# Patient Record
Sex: Female | Born: 1945 | Race: White | Hispanic: No | Marital: Married | Smoking: Never smoker
Health system: Southern US, Community
[De-identification: ages and names within clinical notes are randomized; demographics above are authoritative.]

## PROBLEM LIST (undated history)

## (undated) DIAGNOSIS — H43819 Vitreous degeneration, unspecified eye: Secondary | ICD-10-CM

## (undated) DIAGNOSIS — M160 Bilateral primary osteoarthritis of hip: Secondary | ICD-10-CM

## (undated) DIAGNOSIS — K5730 Diverticulosis of large intestine without perforation or abscess without bleeding: Secondary | ICD-10-CM

## (undated) DIAGNOSIS — I4891 Unspecified atrial fibrillation: Secondary | ICD-10-CM

## (undated) DIAGNOSIS — N12 Tubulo-interstitial nephritis, not specified as acute or chronic: Secondary | ICD-10-CM

## (undated) DIAGNOSIS — K219 Gastro-esophageal reflux disease without esophagitis: Secondary | ICD-10-CM

## (undated) DIAGNOSIS — R93429 Abnormal radiologic findings on diagnostic imaging of unspecified kidney: Secondary | ICD-10-CM

## (undated) DIAGNOSIS — K279 Peptic ulcer, site unspecified, unspecified as acute or chronic, without hemorrhage or perforation: Secondary | ICD-10-CM

## (undated) DIAGNOSIS — E119 Type 2 diabetes mellitus without complications: Secondary | ICD-10-CM

## (undated) DIAGNOSIS — I10 Essential (primary) hypertension: Secondary | ICD-10-CM

## (undated) HISTORY — DX: Tubulo-interstitial nephritis, not specified as acute or chronic: N12

## (undated) HISTORY — DX: Bilateral primary osteoarthritis of hip: M16.0

## (undated) HISTORY — DX: Peptic ulcer, site unspecified, unspecified as acute or chronic, without hemorrhage or perforation: K27.9

## (undated) HISTORY — DX: Abnormal radiologic findings on diagnostic imaging of unspecified kidney: R93.429

## (undated) HISTORY — DX: Vitreous degeneration, unspecified eye: H43.819

## (undated) HISTORY — DX: Gastro-esophageal reflux disease without esophagitis: K21.9

## (undated) HISTORY — DX: Diverticulosis of large intestine without perforation or abscess without bleeding: K57.30

---
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
## 1972-03-14 HISTORY — PX: TUBAL LIGATION: SHX77

## 1980-03-14 DIAGNOSIS — K279 Peptic ulcer, site unspecified, unspecified as acute or chronic, without hemorrhage or perforation: Secondary | ICD-10-CM

## 1980-03-14 HISTORY — DX: Peptic ulcer, site unspecified, unspecified as acute or chronic, without hemorrhage or perforation: K27.9

## 1989-01-12 HISTORY — PX: CHOLECYSTECTOMY: SHX55

## 1989-01-12 HISTORY — PX: TOTAL ABDOMINAL HYSTERECTOMY: SHX209

## 1993-03-14 HISTORY — PX: BREAST REDUCTION SURGERY: SHX8

## 1994-03-14 HISTORY — PX: EXCISION MORTON'S NEUROMA: SHX5013

## 1997-09-11 HISTORY — PX: SALPINGOOPHORECTOMY: SHX82

## 1998-10-09 ENCOUNTER — Encounter: Payer: Self-pay | Admitting: General Surgery

## 1998-10-12 ENCOUNTER — Encounter (INDEPENDENT_AMBULATORY_CARE_PROVIDER_SITE_OTHER): Payer: Self-pay | Admitting: Specialist

## 1998-10-12 ENCOUNTER — Inpatient Hospital Stay (HOSPITAL_COMMUNITY): Admission: RE | Admit: 1998-10-12 | Discharge: 1998-10-15 | Payer: Self-pay | Admitting: *Deleted

## 1998-10-12 ENCOUNTER — Encounter: Payer: Self-pay | Admitting: General Surgery

## 1999-07-13 ENCOUNTER — Encounter: Admission: RE | Admit: 1999-07-13 | Discharge: 1999-07-13 | Payer: Self-pay | Admitting: Family Medicine

## 1999-07-13 ENCOUNTER — Encounter: Payer: Self-pay | Admitting: Family Medicine

## 1999-07-27 ENCOUNTER — Ambulatory Visit (HOSPITAL_COMMUNITY): Admission: RE | Admit: 1999-07-27 | Discharge: 1999-07-27 | Payer: Self-pay | Admitting: Neurosurgery

## 1999-07-27 ENCOUNTER — Encounter: Payer: Self-pay | Admitting: Neurosurgery

## 2000-02-04 ENCOUNTER — Other Ambulatory Visit: Admission: RE | Admit: 2000-02-04 | Discharge: 2000-02-04 | Payer: Self-pay | Admitting: Obstetrics and Gynecology

## 2000-03-28 ENCOUNTER — Encounter: Admission: RE | Admit: 2000-03-28 | Discharge: 2000-03-28 | Payer: Self-pay | Admitting: Family Medicine

## 2000-04-25 ENCOUNTER — Encounter: Admission: RE | Admit: 2000-04-25 | Discharge: 2000-04-25 | Payer: Self-pay | Admitting: Family Medicine

## 2000-04-27 ENCOUNTER — Ambulatory Visit (HOSPITAL_COMMUNITY): Admission: RE | Admit: 2000-04-27 | Discharge: 2000-04-27 | Payer: Self-pay | Admitting: Sports Medicine

## 2000-04-27 ENCOUNTER — Encounter: Payer: Self-pay | Admitting: Sports Medicine

## 2000-05-02 ENCOUNTER — Encounter: Admission: RE | Admit: 2000-05-02 | Discharge: 2000-05-02 | Payer: Self-pay | Admitting: Family Medicine

## 2000-05-26 ENCOUNTER — Encounter: Admission: RE | Admit: 2000-05-26 | Discharge: 2000-05-26 | Payer: Self-pay | Admitting: Family Medicine

## 2000-05-31 ENCOUNTER — Encounter: Payer: Self-pay | Admitting: Obstetrics and Gynecology

## 2000-05-31 ENCOUNTER — Encounter: Admission: RE | Admit: 2000-05-31 | Discharge: 2000-05-31 | Payer: Self-pay | Admitting: Obstetrics and Gynecology

## 2000-06-13 ENCOUNTER — Encounter: Admission: RE | Admit: 2000-06-13 | Discharge: 2000-06-13 | Payer: Self-pay | Admitting: Family Medicine

## 2000-08-09 ENCOUNTER — Encounter: Admission: RE | Admit: 2000-08-09 | Discharge: 2000-08-09 | Payer: Self-pay | Admitting: Family Medicine

## 2001-01-12 DIAGNOSIS — N12 Tubulo-interstitial nephritis, not specified as acute or chronic: Secondary | ICD-10-CM

## 2001-01-12 HISTORY — DX: Tubulo-interstitial nephritis, not specified as acute or chronic: N12

## 2001-01-16 ENCOUNTER — Encounter: Admission: RE | Admit: 2001-01-16 | Discharge: 2001-01-16 | Payer: Self-pay | Admitting: Family Medicine

## 2001-01-30 ENCOUNTER — Encounter: Admission: RE | Admit: 2001-01-30 | Discharge: 2001-01-30 | Payer: Self-pay | Admitting: Family Medicine

## 2001-02-10 ENCOUNTER — Encounter: Admission: RE | Admit: 2001-02-10 | Discharge: 2001-02-10 | Payer: Self-pay | Admitting: Family Medicine

## 2001-02-10 ENCOUNTER — Encounter: Payer: Self-pay | Admitting: Family Medicine

## 2001-02-11 ENCOUNTER — Encounter (INDEPENDENT_AMBULATORY_CARE_PROVIDER_SITE_OTHER): Payer: Self-pay | Admitting: *Deleted

## 2001-02-11 LAB — CONVERTED CEMR LAB

## 2001-02-13 ENCOUNTER — Encounter: Admission: RE | Admit: 2001-02-13 | Discharge: 2001-02-13 | Payer: Self-pay | Admitting: Family Medicine

## 2001-02-26 ENCOUNTER — Encounter: Admission: RE | Admit: 2001-02-26 | Discharge: 2001-05-27 | Payer: Self-pay | Admitting: Family Medicine

## 2001-02-26 ENCOUNTER — Other Ambulatory Visit: Admission: RE | Admit: 2001-02-26 | Discharge: 2001-02-26 | Payer: Self-pay | Admitting: Obstetrics and Gynecology

## 2001-04-17 ENCOUNTER — Encounter: Admission: RE | Admit: 2001-04-17 | Discharge: 2001-04-17 | Payer: Self-pay | Admitting: Family Medicine

## 2001-06-26 ENCOUNTER — Encounter: Admission: RE | Admit: 2001-06-26 | Discharge: 2001-06-26 | Payer: Self-pay | Admitting: Family Medicine

## 2001-07-26 ENCOUNTER — Encounter: Admission: RE | Admit: 2001-07-26 | Discharge: 2001-07-26 | Payer: Self-pay | Admitting: Family Medicine

## 2001-11-28 ENCOUNTER — Other Ambulatory Visit: Admission: RE | Admit: 2001-11-28 | Discharge: 2001-11-28 | Payer: Self-pay | Admitting: Obstetrics and Gynecology

## 2001-12-17 ENCOUNTER — Encounter: Admission: RE | Admit: 2001-12-17 | Discharge: 2001-12-17 | Payer: Self-pay | Admitting: Family Medicine

## 2002-04-16 ENCOUNTER — Encounter: Admission: RE | Admit: 2002-04-16 | Discharge: 2002-04-16 | Payer: Self-pay | Admitting: Family Medicine

## 2002-04-24 ENCOUNTER — Encounter: Admission: RE | Admit: 2002-04-24 | Discharge: 2002-04-24 | Payer: Self-pay | Admitting: Family Medicine

## 2002-04-24 ENCOUNTER — Encounter: Payer: Self-pay | Admitting: Family Medicine

## 2002-04-25 DIAGNOSIS — R93429 Abnormal radiologic findings on diagnostic imaging of unspecified kidney: Secondary | ICD-10-CM

## 2002-04-25 HISTORY — DX: Abnormal radiologic findings on diagnostic imaging of unspecified kidney: R93.429

## 2002-05-07 ENCOUNTER — Encounter: Admission: RE | Admit: 2002-05-07 | Discharge: 2002-05-07 | Payer: Self-pay | Admitting: Sports Medicine

## 2002-07-30 ENCOUNTER — Encounter: Admission: RE | Admit: 2002-07-30 | Discharge: 2002-07-30 | Payer: Self-pay | Admitting: Family Medicine

## 2002-08-19 ENCOUNTER — Ambulatory Visit (HOSPITAL_COMMUNITY): Admission: RE | Admit: 2002-08-19 | Discharge: 2002-08-19 | Payer: Self-pay | Admitting: Sports Medicine

## 2002-08-19 ENCOUNTER — Encounter: Admission: RE | Admit: 2002-08-19 | Discharge: 2002-08-19 | Payer: Self-pay | Admitting: Sports Medicine

## 2002-08-21 ENCOUNTER — Encounter: Admission: RE | Admit: 2002-08-21 | Discharge: 2002-08-21 | Payer: Self-pay | Admitting: Sports Medicine

## 2002-08-21 ENCOUNTER — Encounter: Payer: Self-pay | Admitting: Sports Medicine

## 2002-08-27 ENCOUNTER — Encounter: Admission: RE | Admit: 2002-08-27 | Discharge: 2002-08-27 | Payer: Self-pay | Admitting: Family Medicine

## 2002-10-22 ENCOUNTER — Encounter: Admission: RE | Admit: 2002-10-22 | Discharge: 2002-10-22 | Payer: Self-pay | Admitting: Family Medicine

## 2002-11-27 ENCOUNTER — Encounter: Admission: RE | Admit: 2002-11-27 | Discharge: 2002-11-27 | Payer: Self-pay | Admitting: Sports Medicine

## 2002-12-20 ENCOUNTER — Encounter: Admission: RE | Admit: 2002-12-20 | Discharge: 2002-12-20 | Payer: Self-pay | Admitting: Sports Medicine

## 2002-12-31 ENCOUNTER — Encounter: Admission: RE | Admit: 2002-12-31 | Discharge: 2002-12-31 | Payer: Self-pay | Admitting: Family Medicine

## 2003-01-29 ENCOUNTER — Encounter: Admission: RE | Admit: 2003-01-29 | Discharge: 2003-01-29 | Payer: Self-pay | Admitting: Family Medicine

## 2003-09-02 ENCOUNTER — Encounter: Admission: RE | Admit: 2003-09-02 | Discharge: 2003-09-02 | Payer: Self-pay | Admitting: Family Medicine

## 2003-10-20 ENCOUNTER — Encounter: Admission: RE | Admit: 2003-10-20 | Discharge: 2003-10-20 | Payer: Self-pay | Admitting: Family Medicine

## 2004-01-21 ENCOUNTER — Ambulatory Visit: Payer: Self-pay | Admitting: Family Medicine

## 2004-06-01 ENCOUNTER — Ambulatory Visit: Payer: Self-pay | Admitting: Family Medicine

## 2004-06-08 ENCOUNTER — Emergency Department (HOSPITAL_COMMUNITY): Admission: EM | Admit: 2004-06-08 | Discharge: 2004-06-08 | Payer: Self-pay | Admitting: Emergency Medicine

## 2004-06-15 ENCOUNTER — Ambulatory Visit: Payer: Self-pay | Admitting: Sports Medicine

## 2004-08-10 ENCOUNTER — Ambulatory Visit: Payer: Self-pay | Admitting: Family Medicine

## 2004-08-12 DIAGNOSIS — K5730 Diverticulosis of large intestine without perforation or abscess without bleeding: Secondary | ICD-10-CM

## 2004-08-12 HISTORY — DX: Diverticulosis of large intestine without perforation or abscess without bleeding: K57.30

## 2005-01-25 ENCOUNTER — Ambulatory Visit: Payer: Self-pay | Admitting: Family Medicine

## 2005-08-16 ENCOUNTER — Ambulatory Visit: Payer: Self-pay | Admitting: Family Medicine

## 2005-10-11 ENCOUNTER — Ambulatory Visit: Payer: Self-pay | Admitting: Family Medicine

## 2005-11-01 ENCOUNTER — Ambulatory Visit: Payer: Self-pay | Admitting: Family Medicine

## 2006-01-03 ENCOUNTER — Ambulatory Visit: Payer: Self-pay | Admitting: Family Medicine

## 2006-01-17 ENCOUNTER — Ambulatory Visit: Payer: Self-pay | Admitting: Family Medicine

## 2006-01-20 ENCOUNTER — Ambulatory Visit: Payer: Self-pay | Admitting: Family Medicine

## 2006-03-14 HISTORY — PX: CERVICAL DISCECTOMY: SHX98

## 2006-03-19 ENCOUNTER — Observation Stay (HOSPITAL_COMMUNITY): Admission: EM | Admit: 2006-03-19 | Discharge: 2006-03-20 | Payer: Self-pay | Admitting: Emergency Medicine

## 2006-04-10 ENCOUNTER — Encounter: Payer: Self-pay | Admitting: Family Medicine

## 2006-04-11 ENCOUNTER — Ambulatory Visit: Payer: Self-pay | Admitting: Family Medicine

## 2006-05-11 DIAGNOSIS — M5030 Other cervical disc degeneration, unspecified cervical region: Secondary | ICD-10-CM | POA: Insufficient documentation

## 2006-05-11 DIAGNOSIS — E1169 Type 2 diabetes mellitus with other specified complication: Secondary | ICD-10-CM | POA: Insufficient documentation

## 2006-05-11 DIAGNOSIS — K5730 Diverticulosis of large intestine without perforation or abscess without bleeding: Secondary | ICD-10-CM | POA: Insufficient documentation

## 2006-05-11 DIAGNOSIS — E669 Obesity, unspecified: Secondary | ICD-10-CM

## 2006-05-11 DIAGNOSIS — E119 Type 2 diabetes mellitus without complications: Secondary | ICD-10-CM

## 2006-05-11 DIAGNOSIS — E785 Hyperlipidemia, unspecified: Secondary | ICD-10-CM

## 2006-05-12 ENCOUNTER — Encounter (INDEPENDENT_AMBULATORY_CARE_PROVIDER_SITE_OTHER): Payer: Self-pay | Admitting: *Deleted

## 2006-06-01 ENCOUNTER — Encounter: Payer: Self-pay | Admitting: Family Medicine

## 2006-08-01 ENCOUNTER — Ambulatory Visit: Payer: Self-pay | Admitting: Family Medicine

## 2006-08-01 DIAGNOSIS — M719 Bursopathy, unspecified: Secondary | ICD-10-CM

## 2006-08-01 DIAGNOSIS — M67919 Unspecified disorder of synovium and tendon, unspecified shoulder: Secondary | ICD-10-CM | POA: Insufficient documentation

## 2006-08-01 DIAGNOSIS — F4321 Adjustment disorder with depressed mood: Secondary | ICD-10-CM | POA: Insufficient documentation

## 2006-08-01 LAB — CONVERTED CEMR LAB
CO2: 25 meq/L (ref 19–32)
Chloride: 102 meq/L (ref 96–112)
Creatinine, Ser: 0.87 mg/dL (ref 0.40–1.20)
Hgb A1c MFr Bld: 6.9 %
Nitrite: NEGATIVE
Potassium: 5.1 meq/L (ref 3.5–5.3)
Urobilinogen, UA: 0.2
WBC Urine, dipstick: NEGATIVE
pH: 5.5

## 2006-11-08 ENCOUNTER — Encounter: Payer: Self-pay | Admitting: Family Medicine

## 2006-12-26 ENCOUNTER — Ambulatory Visit: Payer: Self-pay | Admitting: Family Medicine

## 2006-12-26 DIAGNOSIS — M2020 Hallux rigidus, unspecified foot: Secondary | ICD-10-CM

## 2006-12-26 LAB — CONVERTED CEMR LAB
Albumin: 4.4 g/dL (ref 3.5–5.2)
Alkaline Phosphatase: 69 units/L (ref 39–117)
CO2: 24 meq/L (ref 19–32)
Chloride: 103 meq/L (ref 96–112)
Cholesterol: 171 mg/dL (ref 0–200)
Glucose, Bld: 148 mg/dL — ABNORMAL HIGH (ref 70–99)
LDL Cholesterol: 83 mg/dL (ref 0–99)
Potassium: 4.9 meq/L (ref 3.5–5.3)
RBC: 4.88 M/uL (ref 3.87–5.11)
Sodium: 140 meq/L (ref 135–145)
Total Protein: 6.6 g/dL (ref 6.0–8.3)
Triglycerides: 107 mg/dL (ref <150)
WBC: 5.8 10*3/uL (ref 4.0–10.5)

## 2006-12-27 ENCOUNTER — Encounter: Payer: Self-pay | Admitting: Family Medicine

## 2007-03-19 ENCOUNTER — Ambulatory Visit: Payer: Self-pay | Admitting: Family Medicine

## 2007-03-19 ENCOUNTER — Telehealth: Payer: Self-pay | Admitting: *Deleted

## 2007-06-13 DIAGNOSIS — M160 Bilateral primary osteoarthritis of hip: Secondary | ICD-10-CM

## 2007-06-13 HISTORY — DX: Bilateral primary osteoarthritis of hip: M16.0

## 2007-07-17 ENCOUNTER — Encounter: Payer: Self-pay | Admitting: Family Medicine

## 2007-07-31 ENCOUNTER — Encounter: Payer: Self-pay | Admitting: Family Medicine

## 2007-08-09 ENCOUNTER — Ambulatory Visit: Payer: Self-pay | Admitting: Family Medicine

## 2007-08-09 DIAGNOSIS — M1990 Unspecified osteoarthritis, unspecified site: Secondary | ICD-10-CM | POA: Insufficient documentation

## 2007-08-09 LAB — CONVERTED CEMR LAB
ALT: 27 units/L (ref 0–35)
CO2: 25 meq/L (ref 19–32)
Calcium: 10 mg/dL (ref 8.4–10.5)
Chloride: 101 meq/L (ref 96–112)
Creatinine, Ser: 0.78 mg/dL (ref 0.40–1.20)
Glucose, Bld: 140 mg/dL — ABNORMAL HIGH (ref 70–99)
Hgb A1c MFr Bld: 7.3 %
Sodium: 138 meq/L (ref 135–145)
Total Bilirubin: 0.5 mg/dL (ref 0.3–1.2)
Total Protein: 6.9 g/dL (ref 6.0–8.3)

## 2007-08-13 ENCOUNTER — Encounter: Payer: Self-pay | Admitting: Family Medicine

## 2007-11-30 ENCOUNTER — Encounter: Payer: Self-pay | Admitting: Family Medicine

## 2007-12-14 ENCOUNTER — Ambulatory Visit: Payer: Self-pay | Admitting: Family Medicine

## 2008-02-12 HISTORY — PX: BUNIONECTOMY: SHX129

## 2008-02-25 ENCOUNTER — Ambulatory Visit: Payer: Self-pay | Admitting: Family Medicine

## 2008-03-27 ENCOUNTER — Ambulatory Visit: Payer: Self-pay | Admitting: Family Medicine

## 2008-03-27 LAB — CONVERTED CEMR LAB
AST: 16 units/L (ref 0–37)
Albumin: 4.3 g/dL (ref 3.5–5.2)
Alkaline Phosphatase: 66 units/L (ref 39–117)
Cholesterol, target level: 200 mg/dL
LDL Cholesterol: 91 mg/dL (ref 0–99)
LDL Goal: 100 mg/dL
Microalbumin U total vol: NORMAL mg/L
Potassium: 5 meq/L (ref 3.5–5.3)
Sodium: 141 meq/L (ref 135–145)
TSH: 0.356 microintl units/mL (ref 0.350–4.50)
Total Bilirubin: 0.4 mg/dL (ref 0.3–1.2)
Total Protein: 6.7 g/dL (ref 6.0–8.3)
VLDL: 27 mg/dL (ref 0–40)

## 2008-03-29 ENCOUNTER — Encounter: Payer: Self-pay | Admitting: Family Medicine

## 2008-07-08 ENCOUNTER — Emergency Department (HOSPITAL_BASED_OUTPATIENT_CLINIC_OR_DEPARTMENT_OTHER): Admission: EM | Admit: 2008-07-08 | Discharge: 2008-07-08 | Payer: Self-pay | Admitting: Emergency Medicine

## 2008-08-13 ENCOUNTER — Telehealth: Payer: Self-pay | Admitting: Family Medicine

## 2008-08-14 ENCOUNTER — Telehealth: Payer: Self-pay | Admitting: *Deleted

## 2008-12-01 ENCOUNTER — Encounter: Payer: Self-pay | Admitting: Family Medicine

## 2008-12-19 ENCOUNTER — Telehealth: Payer: Self-pay | Admitting: Family Medicine

## 2009-01-01 ENCOUNTER — Ambulatory Visit: Payer: Self-pay | Admitting: Family Medicine

## 2009-01-01 ENCOUNTER — Encounter: Payer: Self-pay | Admitting: Family Medicine

## 2009-01-01 LAB — CONVERTED CEMR LAB
CO2: 23 meq/L (ref 19–32)
Calcium: 9.7 mg/dL (ref 8.4–10.5)
Chloride: 102 meq/L (ref 96–112)
MCV: 94.7 fL (ref 78.0–100.0)
Platelets: 237 10*3/uL (ref 150–400)
Sodium: 138 meq/L (ref 135–145)

## 2009-01-06 ENCOUNTER — Ambulatory Visit: Payer: Self-pay | Admitting: Family Medicine

## 2009-02-03 ENCOUNTER — Ambulatory Visit: Payer: Self-pay | Admitting: Family Medicine

## 2009-02-03 ENCOUNTER — Telehealth (INDEPENDENT_AMBULATORY_CARE_PROVIDER_SITE_OTHER): Payer: Self-pay | Admitting: *Deleted

## 2009-04-07 ENCOUNTER — Ambulatory Visit: Payer: Self-pay | Admitting: Vascular Surgery

## 2009-04-07 ENCOUNTER — Encounter (INDEPENDENT_AMBULATORY_CARE_PROVIDER_SITE_OTHER): Payer: Self-pay | Admitting: Orthopedic Surgery

## 2009-04-07 ENCOUNTER — Ambulatory Visit: Admission: RE | Admit: 2009-04-07 | Discharge: 2009-04-07 | Payer: Self-pay | Admitting: Orthopedic Surgery

## 2009-08-25 ENCOUNTER — Telehealth: Payer: Self-pay | Admitting: *Deleted

## 2009-10-30 ENCOUNTER — Ambulatory Visit: Payer: Self-pay | Admitting: Family Medicine

## 2009-10-30 ENCOUNTER — Encounter: Payer: Self-pay | Admitting: Family Medicine

## 2009-10-30 LAB — CONVERTED CEMR LAB
ALT: 17 units/L (ref 0–35)
Albumin: 4.2 g/dL (ref 3.5–5.2)
CO2: 28 meq/L (ref 19–32)
Calcium: 9.4 mg/dL (ref 8.4–10.5)
Chloride: 101 meq/L (ref 96–112)
Cholesterol: 156 mg/dL (ref 0–200)
Glucose, Bld: 144 mg/dL — ABNORMAL HIGH (ref 70–99)
Hgb A1c MFr Bld: 6.6 %
LDL Cholesterol: 82 mg/dL (ref 0–99)
MCHC: 32.6 g/dL (ref 30.0–36.0)
MCV: 91.9 fL (ref 78.0–100.0)
Platelets: 244 10*3/uL (ref 150–400)
RBC: 4.68 M/uL (ref 3.87–5.11)
Sodium: 135 meq/L (ref 135–145)
Total Protein: 6.4 g/dL (ref 6.0–8.3)
Triglycerides: 99 mg/dL (ref <150)
VLDL: 20 mg/dL (ref 0–40)
WBC: 6 10*3/uL (ref 4.0–10.5)

## 2009-11-03 ENCOUNTER — Ambulatory Visit: Payer: Self-pay | Admitting: Family Medicine

## 2009-11-03 DIAGNOSIS — M170 Bilateral primary osteoarthritis of knee: Secondary | ICD-10-CM | POA: Insufficient documentation

## 2009-11-03 DIAGNOSIS — I10 Essential (primary) hypertension: Secondary | ICD-10-CM | POA: Insufficient documentation

## 2009-12-08 ENCOUNTER — Telehealth: Payer: Self-pay | Admitting: *Deleted

## 2009-12-09 ENCOUNTER — Encounter: Payer: Self-pay | Admitting: Family Medicine

## 2009-12-29 ENCOUNTER — Telehealth: Payer: Self-pay | Admitting: Family Medicine

## 2010-01-30 ENCOUNTER — Encounter: Payer: Self-pay | Admitting: Family Medicine

## 2010-04-13 NOTE — Assessment & Plan Note (Signed)
Summary: prob list review   

## 2010-04-13 NOTE — Progress Notes (Signed)
Summary: pt needs appt  ---- Converted from flag ---- ---- 08/25/2009 1:21 PM, Zachery Dauer MD wrote: Please contact her. Overdue for an appointment. ------------------------------  called pt left message with family member to call back

## 2010-04-13 NOTE — Progress Notes (Signed)
Summary: Rx Req  Phone Note Refill Request Call back at Home Phone [phone] Message from:  Patient  Refills Requested: Medication #1:  METFORMIN HCL 1000 MG TABS Take 2 1/2 tablet by mouth as directed  Medication #2:  DIABETA 5 MG TABS Take one half tablet twice daily [facility]  ALSO WONDERING IF SHE CAN HAVE SOMETHING FOR SINUSES. HAS SEVERAL FAMILY ISSUES GOING ON TO WHERE SHE CAN'T COME IN.  [facility] JUST PAST BROTHER IN ICU  Initial call taken by: Clydell Hakim,  December 29, 2009 8:36 AM

## 2010-04-13 NOTE — Assessment & Plan Note (Signed)
Summary: sinus problem, bloody secretions/ls   Vital Signs:  Patient profile:   65 year old female Menstrual status:  hysterectomy Height:      67.25 inches Weight:      221 pounds BMI:     34.48 Temp:     96.8 degrees F oral Pulse rate:   75 / minute BP sitting:   137 / 75  (left arm) Cuff size:   regular  Vitals Entered By: Tessie Fass CMA (February 03, 2009 1:45 PM) CC: sinus pressure and bloody secretions x 1 week. Dry cough x 1 week Is Patient Diabetic? Yes Pain Assessment Patient in pain? yes     Location: head Intensity: 8   Primary Care Provider:  Zachery Dauer MD  CC:  sinus pressure and bloody secretions x 1 week. Dry cough x 1 week.  History of Present Illness: one week of uri symptoms with bleeding from both nares. No fever but chilly. Dry cough. saline spray, and wash. Afrin once nightly. occipital and maxillary headache.   Habits & Providers  Alcohol-Tobacco-Diet     Tobacco Status: never  Allergies: 1)  Zocor (Simvastatin)   Complete Medication List: 1)  Lipitor 10 Mg Tabs (Atorvastatin calcium) .... Take 1/2 tablet by mouth once a day 2)  Lisinopril 10 Mg Tabs (Lisinopril) .... Take 1 tablet by mouth once a day 3)  Metformin Hcl 1000 Mg Tabs (Metformin hcl) .... Take 2 1/2 tablet by mouth as directed 4)  Tums E-x 750 Mg Chew (Calcium carbonate antacid) .... Take 1 tablet by mouth twice a day 5)  Celebrex 100 Mg Caps (Celecoxib) .... Take one tablet daily as needed 6)  Prilosec Otc 20 Mg Tbec (Omeprazole magnesium) .... Take one tablet daily 7)  Vitamin D-1000 Max St [phone] Mg-unit Tabs (Calcium-vitamin d) .... Take one tablet daily 8)  Diabeta 5 Mg Tabs (Glyburide) .... Take one half tablet twice daily 9)  Co Q-10 30 Mg Caps (Coenzyme q10) .... Take one tablet daily 10)  Amoxicillin 500 Mg Caps (Amoxicillin) .... Take one cap three times a day  Patient Instructions: 1)  Take the Amoxicillin for 7 days. Recheck if fail to improve.    Prescriptions: AMOXICILLIN 500 MG CAPS (AMOXICILLIN) Take one cap three times a day  #21 x 0   Entered and Authorized by:   Zachery Dauer MD   Signed by:   Zachery Dauer MD on 02/03/2009   Method used:   Electronically to        [id number] (retail)       [address] [place], [place]  [address]       Ph: [phone]       Fax: [phone]   RxID:   [id number]   Appended Document: sinus problem, bloody secretions/ls     Primary Care Provider:  Zachery Dauer MD   History of Present Illness: One week of uri symptoms with dry cough.Occiptal  headache and bilateral maxillary pain. No fever but feels chilly. Blowing blood out of both nares.   sugars not elevated  Allergies: 1)  Zocor (Simvastatin)  Physical Exam  General:  Mildly ill appearing Eyes:  injected conjectivae Ears:  R ear normal.   Nose:  no external erythema and no septum abnormalities.   Mouth:  Oral mucosa and oropharynx without lesions or exudates.  Teeth in good repair. Neck:  Thyroid normal Lungs:  Normal respiratory effort, chest expands symmetrically. Lungs are clear to auscultation, no crackles or wheezes. Heart:  Normal rate and regular rhythm. S1 and S2 normal without gallop, murmur, click, rub or other extra sounds.   Impression & Recommendations:  Problem # 1:  MAXILLARY SINUSITIS (ICD-473.0)  Probably still viral. Gave her the option of antibiotic. She will moisturize her environment. Has been using Afrin daily so recommended that she stop that.  Her updated medication list for this problem includes:    Amoxicillin 500 Mg Caps (Amoxicillin) .Marland Kitchen... Take one cap three times a day  Orders: [facility]- Est Level  3 (29937)  Complete Medication List: 1)  Lipitor 10 Mg Tabs (Atorvastatin calcium) .... Take 1/2 tablet by mouth once a day 2)  Lisinopril 10 Mg Tabs (Lisinopril) .... Take 1 tablet by mouth once a day 3)  Metformin Hcl 1000 Mg Tabs (Metformin hcl)  .... Take 2 1/2 tablet by mouth as directed 4)  Tums E-x 750 Mg Chew (Calcium carbonate antacid) .... Take 1 tablet by mouth twice a day 5)  Celebrex 100 Mg Caps (Celecoxib) .... Take one tablet daily as needed 6)  Prilosec Otc 20 Mg Tbec (Omeprazole magnesium) .... Take one tablet daily 7)  Vitamin D-1000 Max St [phone] Mg-unit Tabs (Calcium-vitamin d) .... Take one tablet daily 8)  Diabeta 5 Mg Tabs (Glyburide) .... Take one half tablet twice daily 9)  Co Q-10 30 Mg Caps (Coenzyme q10) .... Take one tablet daily 10)  Amoxicillin 500 Mg Caps (Amoxicillin) .... Take one cap three times a day  Patient Instructions: 1)  Take Amoxicillin 500 mg two times a day for 7 days. Call if you develop fever or fail to improve.

## 2010-04-13 NOTE — Miscellaneous (Signed)
Summary: lab orders  Clinical Lists Changes  Orders: Added new Test order of Comp Met-[facility] [phone]) - Signed Added new Test order of Lipid-[facility] ([id number]) - Signed Added new Test order of CBC-[facility] (29562) - Signed Added new Test order of A1C-[facility] (13086) - Signed

## 2010-04-13 NOTE — Progress Notes (Signed)
Summary: resch  Phone Note Call from Patient   Caller: Patient Summary of Call: is not going to be seen today since her mother needs to be seen worse. (appt @ 4pm) Initial call taken by: De Nurse,  December 08, 2009 1:33 PM

## 2010-04-13 NOTE — Assessment & Plan Note (Signed)
Summary: F/U/KH   Vital Signs:  Patient profile:   65 year old female Menstrual status:  hysterectomy Height:      67.25 inches Weight:      220 pounds BMI:     34.32 Temp:     98.3 degrees F oral Pulse rate:   79 / minute BP sitting:   157 / 89  (left arm) Cuff size:   regular  Vitals Entered By: Tessie Fass CMA (November 03, 2009 2:18 PM)  Serial Vital Signs/Assessments:  Time      Position  BP       Pulse  Resp  Temp     By                     158/89                         Zachery Dauer MD  CC: F/U Is Patient Diabetic? Yes Pain Assessment Patient in pain? yes     Location: abdomen Intensity: 6   Primary Care Provider:  Zachery Dauer MD  CC:  F/U.  History of Present Illness: She had right medial and lateral menisectomy and cartilage trimming on July 26th by Dr Thomasena Edis for a knee injury suffered 6 months ago. Continues PT and very painful. follow-up visit in 2 days and may need to extend time off work.   blood pressure at home around 149/78. Is limiting salt. Aggravated today by husband not going to the doctor for rib pain after falling off a lawn tractor earlier this week. Also she sees her mother declining before her eyes. Poor appetite. Mild depression but trusts in her religious faith.   Very tender right lower anterior rib cage with sharp pain with some movements.  blood pressure at home 140/78 or thereabouts.   Habits & Providers  Alcohol-Tobacco-Diet     Tobacco Status: never  Current Medications (verified): 1)  Lipitor 10 Mg Tabs (Atorvastatin Calcium) .... Take 1/2 Tablet By Mouth Once A Day 2)  Lisinopril 20 Mg Tabs (Lisinopril) .... Take One Tablet Daily 3)  Metformin Hcl 1000 Mg Tabs (Metformin Hcl) .... Take 2 1/2 Tablet By Mouth As Directed 4)  Tums E-X 750 Mg Chew (Calcium Carbonate Antacid) .... Take 1 Tablet By Mouth Prn 5)  Celebrex 100 Mg  Caps (Celecoxib) .... Take One Tablet Daily As Needed 6)  Prilosec Otc 20 Mg  Tbec (Omeprazole Magnesium)  .... Take One Tablet Daily 7)  Calcium 600/vitamin D 600-400 Mg-Unit Chew (Calcium Carbonate-Vitamin D) .... Take One Tab Two Times A Day 8)  Diabeta 5 Mg Tabs (Glyburide) .... Take One Half Tablet Twice Daily 9)  Co Q-10 30 Mg  Caps (Coenzyme Q10) .... Take One Tablet Daily  Allergies (verified): 1)  Zocor (Simvastatin)  Physical Exam  General:  alert and overweight-appearing.   Chest Wall:  Very tender over the 10th and 9th CC junctions Lungs:  Normal respiratory effort, chest expands symmetrically. Lungs are clear to auscultation, no crackles or wheezes. Heart:  Normal rate and regular rhythm. S1 and S2 normal without gallop, murmur, click, rub or other extra sounds. Abdomen:  soft and non-tender,.  soft, non-tender, normal bowel sounds, no masses, and abdominal scar(s) consistent wtih lap chole.   Msk:  right knee slightly swollen with arthroscopy scars. Full range of motion  Extremities:  No clubbing, cyanosis, edema, or deformity noted with normal full range of motion of all joints.  Diabetes Management Exam:    Foot Exam (with socks and/or shoes not present):       Sensory-Pinprick/Light touch:          Left medial foot (L-4): normal          Left dorsal foot (L-5): normal          Left lateral foot (S-1): normal          Right medial foot (L-4): normal          Right dorsal foot (L-5): normal          Right lateral foot (S-1): normal       Sensory-Monofilament:          Left foot: normal          Right foot: normal       Sensory-other: bunionectomy scars. soft nodule proximal send right metatarsal since the surgery. Mild scaling of plantar arch       Inspection:          Left foot: normal          Right foot: normal       Nails:          Left foot: normal          Right foot: normal   Impression & Recommendations:  Problem # 1:  DIABETES MELLITUS II, UNCOMPLICATED (ICD-250.00) Assessment Unchanged  Her updated medication list for this problem includes:     Lisinopril 20 Mg Tabs (Lisinopril) .Marland Kitchen... Take one tablet daily    Metformin Hcl 1000 Mg Tabs (Metformin hcl) .Marland Kitchen... Take 2 1/2 tablet by mouth as directed    Diabeta 5 Mg Tabs (Glyburide) .Marland Kitchen... Take one half tablet twice daily  Orders: [facility]- Est  Level 4 (16073)  Problem # 2:  ESSENTIAL HYPERTENSION, BENIGN (ICD-401.1) Increase ACE Her updated medication list for this problem includes:    Lisinopril 20 Mg Tabs (Lisinopril) .Marland Kitchen... Take one tablet daily  Problem # 3:  UNSPECIFIED INTERNAL DERANGEMENT OF KNEE (ICD-717.9) rehabing from surgery Orders: [facility]- Est  Level 4 (71062)  Problem # 4:  OBESITY, NOS (ICD-278.00) Assessment: Improved  Orders: [facility]- Est  Level 4 (69485)  Problem # 5:  HYPERLIPIDEMIA (ICD-272.4)  Her updated medication list for this problem includes:    Lipitor 10 Mg Tabs (Atorvastatin calcium) .Marland Kitchen... Take 1/2 tablet by mouth once a day  Complete Medication List: 1)  Lipitor 10 Mg Tabs (Atorvastatin calcium) .... Take 1/2 tablet by mouth once a day 2)  Lisinopril 20 Mg Tabs (Lisinopril) .... Take one tablet daily 3)  Metformin Hcl 1000 Mg Tabs (Metformin hcl) .... Take 2 1/2 tablet by mouth as directed 4)  Tums E-x 750 Mg Chew (Calcium carbonate antacid) .... Take 1 tablet by mouth prn 5)  Celebrex 100 Mg Caps (Celecoxib) .... Take one tablet daily as needed 6)  Prilosec Otc 20 Mg Tbec (Omeprazole magnesium) .... Take one tablet daily 7)  Calcium 600/vitamin D 600-400 Mg-unit Chew (Calcium carbonate-vitamin d) .... Take one tab two times a day 8)  Diabeta 5 Mg Tabs (Glyburide) .... Take one half tablet twice daily 9)  Co Q-10 30 Mg Caps (Coenzyme q10) .... Take one tablet daily  Patient Instructions: 1)  Goal blood pressure is below 130/80 2)  Your A1c was 6.6 3)  Please schedule a follow-up appointment in 1 month. for blood pressure check Prescriptions: LISINOPRIL 20 MG TABS (LISINOPRIL) Take one tablet daily  #90 x 3   Entered  and Authorized by:   Zachery Dauer  MD   Signed by:   Zachery Dauer MD on 11/04/2009   Method used:   Faxed to ...       [facility] (mail-order)             , [place]         Ph: [phone]       Fax: [phone]   RxID:   [id number]    Prevention & Chronic Care Immunizations   Influenza vaccine: Historical  (01/06/2009)   Influenza vaccine due: 12/13/2008    Tetanus booster: 05/12/2004: Done.   Tetanus booster due: 05/13/2014    Pneumococcal vaccine: Done.  (02/11/2001)   Pneumococcal vaccine due: None    H. zoster vaccine: 08/09/2007: Zostavax  Colorectal Screening   Hemoccult: Done.  (07/13/2002)   Hemoccult due: Not Indicated    Colonoscopy: Done.  (08/12/2004)   Colonoscopy due: 08/13/2014  Other Screening   Pap smear: Done.  (02/11/2001)   Pap smear due: Not Indicated    Mammogram: Assessment: BIRADS 1. Location: [facility].    (12/01/2008)   Mammogram action/deferral: Screening mammogram in 1 year.     (12/01/2008)   Mammogram due: 11/28/2008    DXA bone density scan: Not documented   Smoking status: never  (11/03/2009)  Diabetes Mellitus   HgbA1C: 6.6  (10/30/2009)   Hemoglobin A1C due: 06/25/2008    Eye exam: normal  (08/09/2007)   Eye exam due: 08/08/2008    Foot exam: yes  (11/03/2009)   High risk foot: Not documented   Foot care education: completed  (03/27/2008)   Foot exam due: 09/24/2008    Urine microalbumin/creatinine ratio: Not documented   Urine microalbumin/cr due: 03/27/2009    Diabetes flowsheet reviewed?: Yes   Progress toward A1C goal: At goal  Lipids   Total Cholesterol: 156  (10/30/2009)   LDL: 82  (10/30/2009)   LDL Direct: Not documented   HDL: 54  (10/30/2009)   Triglycerides: 99  (10/30/2009)    SGOT (AST): 15  (10/30/2009)   SGPT (ALT): 17  (10/30/2009)   Alkaline phosphatase: 66  (10/30/2009)   Total bilirubin: 0.3  (10/30/2009)    Lipid flowsheet reviewed?: Yes   Progress toward LDL goal: At goal  Hypertension    Last Blood Pressure: 157 / 89  (11/03/2009)   Serum creatinine: 0.93  (10/30/2009)   Serum potassium 5.4  (10/30/2009)    Hypertension flowsheet reviewed?: Yes   Progress toward BP goal: Deteriorated  Self-Management Support :   Personal Goals (by the next clinic visit) :     Personal A1C goal: 7  (01/06/2009)     Personal blood pressure goal: 130/80  (01/06/2009)     Personal LDL goal: 100  (01/06/2009)     Home glucose monitoring frequency: 1 time daily  (01/06/2009)    Diabetes self-management support: Copy of home glucose meter record  (01/06/2009)   Last diabetes self-management training by diabetes educator: completed    Hypertension self-management support: Not documented    Lipid self-management support: Not documented

## 2010-04-13 NOTE — Consult Note (Signed)
Summary: Tiffany Becker  [facility]   Imported By: De Nurse 12/17/2009 14:38:16  _____________________________________________________________________  External Attachment:    Type:   Image     Comment:   External Document  Appended Document: Tiffany Becker    Clinical Lists Changes  Observations: Added new observation of DIAB EYE EX: No diabetic retinopathy.   Glaucoma suspect.    (12/09/2009 17:15)       Diabetic Eye Exam  Procedure date:  12/09/2009  Findings:      No diabetic retinopathy.   Glaucoma suspect.      Diabetic Eye Exam  Procedure date:  12/09/2009  Findings:      No diabetic retinopathy.   Glaucoma suspect.     Dr Dione Booze

## 2010-04-13 NOTE — Assessment & Plan Note (Signed)
    Prevention & Chronic Care Immunizations   Influenza vaccine: Historical  (01/06/2009)   Influenza vaccine due: 12/13/2008    Tetanus booster: 05/12/2004: Done.   Tetanus booster due: 05/13/2014    Pneumococcal vaccine: Done.  (02/11/2001)   Pneumococcal vaccine due: None    H. zoster vaccine: 08/09/2007: Zostavax  Colorectal Screening   Hemoccult: Done.  (07/13/2002)   Hemoccult due: Not Indicated    Colonoscopy: Done.  (08/12/2004)   Colonoscopy due: 08/13/2014  Other Screening   Pap smear: Done.  (02/11/2001)   Pap smear due: Not Indicated    Mammogram: Assessment: BIRADS 1. Location: [facility].    (12/01/2008)   Mammogram action/deferral: Screening mammogram in 1 year.     (12/01/2008)   Mammogram due: 11/28/2008    DXA bone density scan: Not documented   Smoking status: never  (02/03/2009)  Diabetes Mellitus   HgbA1C: 6.6  (10/30/2009)   Hemoglobin A1C due: 06/25/2008    Eye exam: normal  (08/09/2007)   Eye exam due: 08/08/2008    Foot exam: yes  (03/27/2008)   High risk foot: Not documented   Foot care education: completed  (03/27/2008)   Foot exam due: 09/24/2008    Urine microalbumin/creatinine ratio: Not documented   Urine microalbumin/cr due: 03/27/2009  Lipids   Total Cholesterol: 156  (10/30/2009)   LDL: 82  (10/30/2009)   LDL Direct: Not documented   HDL: 54  (10/30/2009)   Triglycerides: 99  (10/30/2009)    SGOT (AST): 15  (10/30/2009)   SGPT (ALT): 17  (10/30/2009)   Alkaline phosphatase: 66  (10/30/2009)   Total bilirubin: 0.3  (10/30/2009)  Self-Management Support :   Personal Goals (by the next clinic visit) :     Personal A1C goal: 7  (01/06/2009)     Personal blood pressure goal: 130/80  (01/06/2009)     Personal LDL goal: 100  (01/06/2009)     Home glucose monitoring frequency: 1 time daily  (01/06/2009)    Diabetes self-management support: Copy of home glucose meter record  (01/06/2009)  Last diabetes self-management training by diabetes educator: completed    Lipid self-management support: Not documented

## 2010-04-17 ENCOUNTER — Encounter: Payer: Self-pay | Admitting: Family Medicine

## 2010-05-11 ENCOUNTER — Telehealth: Payer: Self-pay | Admitting: Family Medicine

## 2010-05-11 DIAGNOSIS — E785 Hyperlipidemia, unspecified: Secondary | ICD-10-CM

## 2010-05-11 DIAGNOSIS — I10 Essential (primary) hypertension: Secondary | ICD-10-CM

## 2010-05-11 MED ORDER — ATORVASTATIN CALCIUM 10 MG PO TABS: 5.0000 mg | ORAL_TABLET | Freq: Every day | ORAL | Status: DC

## 2010-05-11 MED ORDER — GLYBURIDE 5 MG PO TABS: 2.5000 mg | ORAL_TABLET | Freq: Two times a day (BID) | ORAL | Status: DC

## 2010-05-11 MED ORDER — LISINOPRIL 20 MG PO TABS: 20.0000 mg | ORAL_TABLET | Freq: Every day | ORAL | Status: DC

## 2010-05-11 MED ORDER — METFORMIN HCL 1000 MG PO TABS: 1000.0000 mg | ORAL_TABLET | Freq: Two times a day (BID) | ORAL | Status: DC

## 2010-05-11 NOTE — Telephone Encounter (Signed)
Pt changing pharmacies & needs a refill on all meds sent to [facility]

## 2010-05-11 NOTE — Telephone Encounter (Signed)
Spoke with patient and requests all meds be sent to [facility] because she no longer uses [facility].  Patient states she has been taking metformin 1000 mg twice daily not the 2.5 tabs as we have listed. Consulted Luretha Murphy and she advises to send in as she has been taking at 1000 mg twice daily. Patient advised to schedule appointment with Dr. Sheffield Slider before next refills needed. Months supply sent in

## 2010-06-01 ENCOUNTER — Ambulatory Visit (INDEPENDENT_AMBULATORY_CARE_PROVIDER_SITE_OTHER): Payer: 59 | Admitting: Family Medicine

## 2010-06-01 ENCOUNTER — Encounter: Payer: Self-pay | Admitting: Family Medicine

## 2010-06-01 DIAGNOSIS — M2390 Unspecified internal derangement of unspecified knee: Secondary | ICD-10-CM

## 2010-06-01 DIAGNOSIS — I10 Essential (primary) hypertension: Secondary | ICD-10-CM

## 2010-06-01 DIAGNOSIS — E785 Hyperlipidemia, unspecified: Secondary | ICD-10-CM

## 2010-06-01 DIAGNOSIS — E119 Type 2 diabetes mellitus without complications: Secondary | ICD-10-CM

## 2010-06-01 DIAGNOSIS — E669 Obesity, unspecified: Secondary | ICD-10-CM

## 2010-06-01 LAB — POCT GLYCOSYLATED HEMOGLOBIN (HGB A1C): Hemoglobin A1C: 6.8

## 2010-06-01 LAB — BASIC METABOLIC PANEL
BUN: 11 mg/dL (ref 6–23)
Calcium: 9.2 mg/dL (ref 8.4–10.5)
Glucose, Bld: 126 mg/dL — ABNORMAL HIGH (ref 70–99)

## 2010-06-02 ENCOUNTER — Encounter: Payer: Self-pay | Admitting: Family Medicine

## 2010-06-02 NOTE — Assessment & Plan Note (Signed)
well controlled  

## 2010-06-02 NOTE — Progress Notes (Signed)
  Subjective:    Patient ID: Tiffany Becker, female    DOB: 1945-06-23, 65 y.o.   MRN: [id number]  HPIShe has been feeling well. Worries about her husband who has chest pain, but won't visit his physician. Arthralgias in PIP joints. Good energy levels.     Review of Systems     Objective:   Physical Exam  Constitutional:       Obese  Cardiovascular: Normal rate and regular rhythm.   No murmur heard. Pulmonary/Chest: Effort normal and breath sounds normal.  Musculoskeletal: She exhibits tenderness.       Mild tenderness lateral R knee          Assessment & Plan:

## 2010-06-02 NOTE — Assessment & Plan Note (Signed)
Mildly irritated currently

## 2010-06-02 NOTE — Assessment & Plan Note (Signed)
unchanged

## 2010-06-12 ENCOUNTER — Other Ambulatory Visit: Payer: Self-pay | Admitting: Family Medicine

## 2010-06-13 NOTE — Telephone Encounter (Signed)
Refill request

## 2010-06-14 ENCOUNTER — Telehealth: Payer: Self-pay | Admitting: Family Medicine

## 2010-06-14 NOTE — Telephone Encounter (Signed)
Tiffany Becker need refills on Lisinopril and Glyburide asap.  If y ou need to you can call her at work at [phone] ext [phone] or emial @ [email or web address].

## 2010-06-18 ENCOUNTER — Other Ambulatory Visit: Payer: Self-pay | Admitting: Family Medicine

## 2010-06-20 NOTE — Telephone Encounter (Signed)
Refill request

## 2010-06-21 NOTE — Telephone Encounter (Signed)
Refill request

## 2010-07-30 NOTE — Op Note (Signed)
[name], [name] NO.:  [id number]   MEDICAL RECORD NO.:  [id number]          PATIENT TYPE:  INP   LOCATION:  1829                         FACILITY:  [facility]   PHYSICIAN:  Coletta Memos, M.D.     DATE OF BIRTH:  08-Aug-1945   DATE OF PROCEDURE:  03/19/2006  DATE OF DISCHARGE:                               OPERATIVE REPORT   PREOPERATIVE DIAGNOSIS:  Displaced disk, left, C6-7.   POSTOPERATIVE DIAGNOSIS:  Displaced disk, left, C6-7.   PROCEDURE:  Anterior cervical decompression, C6-7, arthrodesis, C6-7,  with 7-mm lordotic ACF bone.  1. Anterior instrumentation, a 25-mm Venture plate with 10-UV screws      x4.   COMPLICATIONS:  None.   SURGEON:  Coletta Memos, M.D.   INDICATIONS:  Tiffany Becker is a 65 year old who awoke with severe pain  and weakness in the left upper extremity, weakness in the distribution  of C7.  MRI revealed a large herniated disk in the neural foramina on  the left side at C6-7.  I therefore recommended and she agreed to  undergo operative decompression.   OPERATIVE NOTE:  Tiffany Becker was brought to the operating room,  intubated and placed under a general anesthetic without difficulty.  Her  neck was placed in slight extension on a horseshoe headrest.  Her neck  was then prepped and she was draped in a sterile fashion.  I infiltrated  4 mL of 0.5% lidocaine with 1:200,000 epinephrine into the cervical  region starting from the midline, extending to the medial border of the  left sternocleidomastoid muscle.  I opened the skin with a #10 blade and  I took this down to the platysma.  I dissected in a plane above the  platysma rostrally and caudally.  I then opened the platysma in a  horizontal fashion using Metzenbaum scissors.  I dissected in a plane  inferior to the platysma rostrally and caudally.  I identified the  medial strap muscles and the sternocleidomastoid along with the carotid  artery.  I retracted the medial strap muscles  medially, the carotid  artery laterally and created an avascular plane to the cervical spine.  I placed a spinal needle and I was at C4-5, so I moved down 2 spaces,  which could not be seen on x-ray, using 3 spinal needles so that I would  not miss the space.  At C6-7, I then opened the space.  I reflected the  longus colli muscles bilaterally and placed self-retaining retractors.  There was a large osteophyte bridging the C6-7 disk space.  I took the  osteophyte down for a Leksell rongeur.  I then placed distraction pins,  1 in C6, the other in C7, and distracted the disk space.  Using curettes  and a 15 blade, I removed endplates and disk material.  I then brought  the microscope into the operative field and using a high-speed drill,  removed osteophytes, drilled away bone until I got down to the posterior  longitudinal ligament.  I removed remaining disk material and the  ligament and then decompressed the spinal canal bilaterally.  On the  left side, I went into the neural foramen and pulled out what were very  large pieces of disk material and it was clear that this was what was  causing the problem.  I then irrigated the wound again.  When I achieved  hemostasis and decompression of both C7 nerve roots, I then sized the  space.  I placed a 7-mm lordotic graft into the space.  I then removed  the distraction pins.  I then placed a 25-mm Venture plate using self-  tapping screws, first by hand-drilling each hole  using the screws.  That was locked into position without difficulty.  I  then irrigated the wound.  I then closed the wound in layered fashion  using Vicryl sutures.  Dermabond was used for a sterile dressing.  [name] tolerated the procedure well.           ______________________________  Coletta Memos, M.D.     [facility]  D:  03/19/2006  T:  03/20/2006  Job:  [id number]

## 2010-08-17 ENCOUNTER — Other Ambulatory Visit: Payer: Self-pay | Admitting: Family Medicine

## 2010-08-17 DIAGNOSIS — E785 Hyperlipidemia, unspecified: Secondary | ICD-10-CM

## 2010-08-17 NOTE — Telephone Encounter (Signed)
Refill request

## 2010-09-16 ENCOUNTER — Other Ambulatory Visit: Payer: Self-pay | Admitting: Family Medicine

## 2010-09-16 NOTE — Telephone Encounter (Signed)
Refill request

## 2011-01-18 ENCOUNTER — Ambulatory Visit (INDEPENDENT_AMBULATORY_CARE_PROVIDER_SITE_OTHER): Payer: 59 | Admitting: Family Medicine

## 2011-01-18 ENCOUNTER — Encounter: Payer: Self-pay | Admitting: Family Medicine

## 2011-01-18 VITALS — BP 140/79 | HR 60 | Ht 67.25 in | Wt 224.0 lb

## 2011-01-18 DIAGNOSIS — E669 Obesity, unspecified: Secondary | ICD-10-CM

## 2011-01-18 DIAGNOSIS — E119 Type 2 diabetes mellitus without complications: Secondary | ICD-10-CM

## 2011-01-18 DIAGNOSIS — I10 Essential (primary) hypertension: Secondary | ICD-10-CM

## 2011-01-18 DIAGNOSIS — M1990 Unspecified osteoarthritis, unspecified site: Secondary | ICD-10-CM

## 2011-01-18 LAB — POCT GLYCOSYLATED HEMOGLOBIN (HGB A1C): Hemoglobin A1C: 6.8

## 2011-01-18 MED ORDER — CYCLOBENZAPRINE HCL 10 MG PO TABS: 10.0000 mg | ORAL_TABLET | Freq: Three times a day (TID) | ORAL | Status: AC | PRN

## 2011-01-18 NOTE — Patient Instructions (Addendum)
Try taking the Glyburide at supper time and taking a snack at 10 AM to prevent the 11 AM hypoglycemia  Return to see Dr Sheffield Slider in 6 months

## 2011-01-18 NOTE — Assessment & Plan Note (Signed)
Not at goal for diabetes

## 2011-01-18 NOTE — Assessment & Plan Note (Signed)
well controlled  

## 2011-01-18 NOTE — Progress Notes (Signed)
  Subjective:    Patient ID: Tiffany Becker, female    DOB: 1945/04/08, 65 y.o.   MRN: [id number]  HPI is had a couple episodes of hypoglycemia 11 AM when she has not eaten in mid-morning.  She's had no recent  Urinary tract infections but she does start taking trimethoprim sulfa at the first sign of one. She's not taking a half tablet every day   She occasionally gets a low back spasm requests Flexeril to treat this. She's had in the past and uses only occasionally. But when she has the pain will go down into her anterior thighs but not below her knee and is no associated numbness. He had an MRI of her low back when she had one of her cervical spine several years ago. She will occasionally takes Celebrex for the pain but does not take it regularly  Recently both knees been hurting particularly the right eye where she had the arthroscopic cartilage surgery. She plans to go back soon to see orthopedist in followup.  Past couple weeks she's had pain in the right ankle and right lower leg. No history of recent injury but she had a sound like a fracture dislocation H5 involving the ankle. Lateral malleolus has become enlarged.  She agrees the pain in her lower leg may relate to compensating for the knee and ankle pain on that side.  She's had no recent heartburn but is taking the omeprazole once daily.      Review of Systems  Constitutional: Negative for activity change and unexpected weight change.  Respiratory: Negative for shortness of breath.   Cardiovascular: Negative for chest pain.  Musculoskeletal: Positive for back pain, joint swelling and arthralgias.       Objective:   Physical Exam she is moderately overweight but well appearing.  Chest clear Heart regular rhythm without murmur Abdomen soft without masses or tenderness Right knee has well-healed surgical scar, No effusion. Tender particularly medial joint line Right lower leg has no swelling but mildly tender over the  peroneal muscles Her ankle has enlargement of the lateral malleolus with some soft tissue swelling anterior. Full range of motion with some discomfort laterally to stressing the ankle joint, no erythema or warmth Pedal pulses are normal Sensation is normal to filament test                Assessment & Plan:

## 2011-01-18 NOTE — Assessment & Plan Note (Addendum)
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

## 2011-01-18 NOTE — Assessment & Plan Note (Signed)
No change 

## 2011-01-19 ENCOUNTER — Other Ambulatory Visit: Payer: Self-pay | Admitting: Family Medicine

## 2011-01-19 DIAGNOSIS — E785 Hyperlipidemia, unspecified: Secondary | ICD-10-CM

## 2011-01-19 NOTE — Telephone Encounter (Signed)
Refill request

## 2011-01-19 NOTE — Assessment & Plan Note (Signed)
She needs schedule fasting labs for lipid profile

## 2011-01-20 ENCOUNTER — Other Ambulatory Visit: Payer: 59

## 2011-01-20 DIAGNOSIS — E785 Hyperlipidemia, unspecified: Secondary | ICD-10-CM

## 2011-01-20 LAB — COMPREHENSIVE METABOLIC PANEL
ALT: 15 U/L (ref 0–35)
AST: 15 U/L (ref 0–37)
Alkaline Phosphatase: 74 U/L (ref 39–117)
Glucose, Bld: 149 mg/dL — ABNORMAL HIGH (ref 70–99)
Potassium: 5 mEq/L (ref 3.5–5.3)
Sodium: 138 mEq/L (ref 135–145)
Total Bilirubin: 0.4 mg/dL (ref 0.3–1.2)
Total Protein: 6.3 g/dL (ref 6.0–8.3)

## 2011-01-20 LAB — LIPID PANEL
LDL Cholesterol: 109 mg/dL — ABNORMAL HIGH (ref 0–99)
VLDL: 28 mg/dL (ref 0–40)

## 2011-01-20 NOTE — Progress Notes (Signed)
cmp and flp done today Jillyan Plitt 

## 2011-01-21 ENCOUNTER — Encounter: Payer: Self-pay | Admitting: Family Medicine

## 2011-03-21 ENCOUNTER — Other Ambulatory Visit: Payer: Self-pay | Admitting: Family Medicine

## 2011-03-21 DIAGNOSIS — E7889 Other lipoprotein metabolism disorders: Secondary | ICD-10-CM

## 2011-03-21 DIAGNOSIS — E782 Mixed hyperlipidemia: Secondary | ICD-10-CM

## 2011-03-21 NOTE — Telephone Encounter (Signed)
Refill request

## 2011-03-24 NOTE — Telephone Encounter (Signed)
Refill request

## 2011-04-25 ENCOUNTER — Other Ambulatory Visit: Payer: Self-pay | Admitting: Family Medicine

## 2011-04-26 NOTE — Telephone Encounter (Signed)
Please let patient know refill of this Septra will have to wait until Dr Sheffield Slider returns from [place] next week.  I am not certain of the indication nor how it is to be taken and dispensed.

## 2011-04-26 NOTE — Telephone Encounter (Signed)
Refill request

## 2011-04-26 NOTE — Telephone Encounter (Signed)
Left message for Kathrin that Dr. Sheffield Slider is on vacation so her refill request will not be addressed until he returns next week.  Ileana Ladd

## 2011-04-28 ENCOUNTER — Telehealth: Payer: Self-pay | Admitting: Family Medicine

## 2011-04-28 NOTE — Telephone Encounter (Signed)
Ms. Schmoker need rx for uti called to pharmacy.  Need to have prescribed for 90 day supply.  Said it's a rx that is usually filled for her.  Call her if there are any problems at [phone] ext [phone]

## 2011-05-09 DIAGNOSIS — H40009 Preglaucoma, unspecified, unspecified eye: Secondary | ICD-10-CM | POA: Insufficient documentation

## 2011-05-09 DIAGNOSIS — IMO0002 Reserved for concepts with insufficient information to code with codable children: Secondary | ICD-10-CM | POA: Insufficient documentation

## 2011-05-09 NOTE — Telephone Encounter (Signed)
Forward to Dr Hale 

## 2011-05-09 NOTE — Telephone Encounter (Signed)
It's listed as prescribed under meds

## 2011-05-09 NOTE — Telephone Encounter (Signed)
Has this been addressed?

## 2011-05-10 NOTE — Telephone Encounter (Signed)
Advised pt Dr Sheffield Slider Rx'd 90days of Bactrim to pharmacy. Pt understood.

## 2011-06-22 ENCOUNTER — Other Ambulatory Visit: Payer: Self-pay | Admitting: Family Medicine

## 2011-06-22 DIAGNOSIS — I10 Essential (primary) hypertension: Secondary | ICD-10-CM

## 2011-09-03 ENCOUNTER — Other Ambulatory Visit: Payer: Self-pay | Admitting: Family Medicine

## 2011-10-05 ENCOUNTER — Other Ambulatory Visit: Payer: Self-pay | Admitting: Family Medicine

## 2011-10-05 DIAGNOSIS — E119 Type 2 diabetes mellitus without complications: Secondary | ICD-10-CM

## 2011-10-05 NOTE — Telephone Encounter (Signed)
Consulted with Dr. Edward Jolly and advised ok to refill metformin once again  for one month . Patient has appointment scheduled 08/27.

## 2011-11-05 ENCOUNTER — Other Ambulatory Visit: Payer: Self-pay | Admitting: Family Medicine

## 2011-11-08 ENCOUNTER — Encounter: Payer: Self-pay | Admitting: Family Medicine

## 2011-11-08 ENCOUNTER — Ambulatory Visit (INDEPENDENT_AMBULATORY_CARE_PROVIDER_SITE_OTHER): Payer: 59 | Admitting: Family Medicine

## 2011-11-08 VITALS — BP 130/80 | HR 66 | Ht 67.25 in | Wt 215.0 lb

## 2011-11-08 DIAGNOSIS — E785 Hyperlipidemia, unspecified: Secondary | ICD-10-CM

## 2011-11-08 DIAGNOSIS — E669 Obesity, unspecified: Secondary | ICD-10-CM

## 2011-11-08 DIAGNOSIS — E119 Type 2 diabetes mellitus without complications: Secondary | ICD-10-CM

## 2011-11-08 DIAGNOSIS — M1990 Unspecified osteoarthritis, unspecified site: Secondary | ICD-10-CM

## 2011-11-08 DIAGNOSIS — I10 Essential (primary) hypertension: Secondary | ICD-10-CM

## 2011-11-08 LAB — POCT UA - MICROALBUMIN
Creatinine, POC: 50 mg/dL
Microalbumin Ur, POC: 10 mg/dL

## 2011-11-08 LAB — BASIC METABOLIC PANEL
Chloride: 102 mEq/L (ref 96–112)
Glucose, Bld: 69 mg/dL — ABNORMAL LOW (ref 70–99)
Potassium: 5 mEq/L (ref 3.5–5.3)
Sodium: 137 mEq/L (ref 135–145)

## 2011-11-08 NOTE — Patient Instructions (Addendum)
Please return to see Dr Sheffield Slider in 3 months.  Schedule a fasting lipid profile 2 weeks before that visit  Stop the Glipizide. Call Dr Sheffield Slider your fasting blood sugars go over 150   I will send in Pravastatin in place of Lipitor to see if it doesn't cause the leg pains.

## 2011-11-09 ENCOUNTER — Encounter: Payer: Self-pay | Admitting: Family Medicine

## 2011-11-09 MED ORDER — PRAVASTATIN SODIUM 40 MG PO TABS: 40.0000 mg | ORAL_TABLET | Freq: Every day | ORAL | Status: DC

## 2011-11-09 NOTE — Assessment & Plan Note (Signed)
Will try Pravastatin in place of Atorvastatin in hopes she can take it daily without leg pains.

## 2011-11-09 NOTE — Assessment & Plan Note (Signed)
Congratulated on her weight loss. Her new goal is walking the dog 20 minutes 4 days weekly.

## 2011-11-09 NOTE — Assessment & Plan Note (Signed)
At top of goal for diabetic

## 2011-11-09 NOTE — Assessment & Plan Note (Signed)
Too well controlled. Will stop Glyburide. No foot problems No microproteinuria

## 2011-11-09 NOTE — Progress Notes (Signed)
  Subjective:    Patient ID: Tiffany Becker, female    DOB: 02-Nov-1945, 66 y.o.   MRN: [id number]  HPI She has been feeling so well this past year that she didn't see a reason to come back sooner. She's been more active since she retired this past spring, and walks with her dog when it isn't too hot. Her appetite is decreased and she's been cooking healthier meals. She's lost weight. Her husband continues to work despite his overweight and edema. She worries about him, but can't get him to go to the doctor more than once daily.   DM - Her blood sugars have been lower and she has low blood sugar symptoms in the early afternoon since she's lost weight. No numbness in her feet.   GERD - she requires an occasional Omeprazole  Lipids - Takes Atorvastatin about 4 days out of 7 due to it causing calf pains.   Review of Systems     Objective:   Physical Exam  Constitutional:       Generalized obesity  Cardiovascular: Normal rate and regular rhythm.   Pulmonary/Chest: Effort normal and breath sounds normal.  Musculoskeletal: She exhibits no edema.  Neurological: She is alert.  Psychiatric: She has a normal mood and affect. Her behavior is normal. Thought content normal.          Assessment & Plan:

## 2011-11-09 NOTE — Assessment & Plan Note (Signed)
Takes Celebrex very sparingly

## 2011-11-21 ENCOUNTER — Other Ambulatory Visit: Payer: Self-pay | Admitting: Family Medicine

## 2011-11-21 NOTE — Telephone Encounter (Signed)
Patient is calling for a Rx for Celebrex 200mg  to be sent to [facility] on [address].

## 2011-11-21 NOTE — Telephone Encounter (Signed)
Will forward to Dr Hale 

## 2011-11-28 MED ORDER — CELECOXIB 100 MG PO CAPS: 100.0000 mg | ORAL_CAPSULE | Freq: Every day | ORAL | Status: DC | PRN

## 2011-12-02 ENCOUNTER — Encounter: Payer: Self-pay | Admitting: Family Medicine

## 2011-12-06 ENCOUNTER — Ambulatory Visit (INDEPENDENT_AMBULATORY_CARE_PROVIDER_SITE_OTHER): Payer: 59 | Admitting: *Deleted

## 2011-12-06 DIAGNOSIS — Z23 Encounter for immunization: Secondary | ICD-10-CM | POA: Diagnosis not present

## 2011-12-07 ENCOUNTER — Other Ambulatory Visit: Payer: Self-pay | Admitting: Family Medicine

## 2012-01-10 DIAGNOSIS — M722 Plantar fascial fibromatosis: Secondary | ICD-10-CM | POA: Diagnosis not present

## 2012-01-17 DIAGNOSIS — M722 Plantar fascial fibromatosis: Secondary | ICD-10-CM | POA: Diagnosis not present

## 2012-03-20 ENCOUNTER — Other Ambulatory Visit: Payer: Self-pay | Admitting: Family Medicine

## 2012-05-07 ENCOUNTER — Other Ambulatory Visit: Payer: Self-pay | Admitting: Family Medicine

## 2012-06-23 ENCOUNTER — Other Ambulatory Visit: Payer: Self-pay | Admitting: Family Medicine

## 2012-07-03 ENCOUNTER — Other Ambulatory Visit: Payer: Self-pay | Admitting: Family Medicine

## 2012-07-03 DIAGNOSIS — E785 Hyperlipidemia, unspecified: Secondary | ICD-10-CM

## 2012-07-03 DIAGNOSIS — E119 Type 2 diabetes mellitus without complications: Secondary | ICD-10-CM

## 2012-07-05 ENCOUNTER — Telehealth: Payer: Self-pay | Admitting: Family Medicine

## 2012-07-05 NOTE — Telephone Encounter (Signed)
I left a message on her machine that labs are ordered if she can come for fasting labs before her office visit

## 2012-07-09 ENCOUNTER — Other Ambulatory Visit: Payer: 59

## 2012-07-09 DIAGNOSIS — E785 Hyperlipidemia, unspecified: Secondary | ICD-10-CM

## 2012-07-09 DIAGNOSIS — E119 Type 2 diabetes mellitus without complications: Secondary | ICD-10-CM

## 2012-07-09 LAB — COMPREHENSIVE METABOLIC PANEL
CO2: 27 mEq/L (ref 19–32)
Creat: 0.89 mg/dL (ref 0.50–1.10)
Glucose, Bld: 156 mg/dL — ABNORMAL HIGH (ref 70–99)
Total Bilirubin: 0.4 mg/dL (ref 0.3–1.2)

## 2012-07-09 LAB — LIPID PANEL
HDL: 53 mg/dL (ref >39)
Triglycerides: 148 mg/dL (ref <150)

## 2012-07-09 NOTE — Progress Notes (Signed)
CMP AND FLP DONE TODAY Tiffany Becker 

## 2012-07-10 ENCOUNTER — Encounter: Payer: Self-pay | Admitting: Family Medicine

## 2012-07-10 ENCOUNTER — Ambulatory Visit (INDEPENDENT_AMBULATORY_CARE_PROVIDER_SITE_OTHER): Payer: 59 | Admitting: Family Medicine

## 2012-07-10 VITALS — BP 150/78 | HR 74 | Temp 97.8°F | Ht 67.25 in | Wt 215.7 lb

## 2012-07-10 DIAGNOSIS — E785 Hyperlipidemia, unspecified: Secondary | ICD-10-CM | POA: Diagnosis not present

## 2012-07-10 DIAGNOSIS — R55 Syncope and collapse: Secondary | ICD-10-CM

## 2012-07-10 DIAGNOSIS — I10 Essential (primary) hypertension: Secondary | ICD-10-CM

## 2012-07-10 DIAGNOSIS — N390 Urinary tract infection, site not specified: Secondary | ICD-10-CM | POA: Diagnosis not present

## 2012-07-10 DIAGNOSIS — E119 Type 2 diabetes mellitus without complications: Secondary | ICD-10-CM | POA: Diagnosis not present

## 2012-07-10 LAB — POCT URINALYSIS DIPSTICK
Blood, UA: NEGATIVE
Glucose, UA: NEGATIVE
Nitrite, UA: NEGATIVE
Spec Grav, UA: 1.01
Urobilinogen, UA: 0.2
pH, UA: 5.5

## 2012-07-10 LAB — POCT GLYCOSYLATED HEMOGLOBIN (HGB A1C): Hemoglobin A1C: 7

## 2012-07-10 MED ORDER — PRAVASTATIN SODIUM 40 MG PO TABS
20.0000 mg | ORAL_TABLET | Freq: Every day | ORAL | Status: DC
Start: 2012-07-10 — End: 2013-07-22

## 2012-07-10 MED ORDER — GABAPENTIN 600 MG PO TABS: 300.0000 mg | ORAL_TABLET | Freq: Three times a day (TID) | ORAL | Status: DC

## 2012-07-10 MED ORDER — GLYBURIDE 5 MG PO TABS: 2.5000 mg | ORAL_TABLET | Freq: Every day | ORAL | Status: DC

## 2012-07-11 ENCOUNTER — Encounter: Payer: Self-pay | Admitting: Family Medicine

## 2012-07-11 DIAGNOSIS — R55 Syncope and collapse: Secondary | ICD-10-CM | POA: Insufficient documentation

## 2012-07-11 MED ORDER — LISINOPRIL 20 MG PO TABS: ORAL_TABLET | ORAL | Status: DC

## 2012-07-11 MED ORDER — ASPIRIN 81 MG PO TBEC: 81.0000 mg | DELAYED_RELEASE_TABLET | Freq: Every day | ORAL | Status: DC

## 2012-07-11 NOTE — Assessment & Plan Note (Signed)
Borderline control. 

## 2012-07-11 NOTE — Assessment & Plan Note (Signed)
well controlled  

## 2012-07-11 NOTE — Progress Notes (Signed)
  Subjective:    Patient ID: Tiffany Becker, female    DOB: 08-25-1945, 67 y.o.   MRN: [id number]  HPI Stress - Constantly worries about her husband who continues severe edema of his legs with stasis dermatitis. He works second shift so she stays awake till he gets home. She retired last year, but stays busy caring from her mother with severe COPD. Brother has bladder cancer. Had an episode of twitching around the right eye and felt like it was going to close while driving a couple weeks ago. She had an unremarkable eye exam about 6 weeks ago.    Insomnia - Awakens 4+ times nightly. Possibly due to right shoulder and neck discomfort and numbness in right 3rd and 4th fingers at times. Hasn't taken gabapentin.   Recurrent UTI - She has suprapubic pressure, which sometimes indicates UTI. No other symptoms. She hasn't used the TMP/Sulfa suppression recently.   Review of Systems  Respiratory: Negative for cough and chest tightness.   Genitourinary: Negative for dysuria, urgency, frequency, flank pain and difficulty urinating.  Neurological: Positive for numbness.       Objective:   Physical Exam  Constitutional: She is oriented to person, place, and time.  Generalized obesity   Cardiovascular: Normal rate and regular rhythm.   No murmur heard. Pulmonary/Chest: Effort normal and breath sounds normal. She has no wheezes. She has no rales.  Musculoskeletal: She exhibits no edema.  Neurological: She is alert and oriented to person, place, and time. She displays normal reflexes. No cranial nerve deficit. Coordination normal.  Psychiatric:  Anxious and unhappy appearing intermittently          Assessment & Plan:

## 2012-07-11 NOTE — Assessment & Plan Note (Addendum)
Possible TIA though no visual changes. She will start one baby aspiring daily and we'll check carotid dopplers.  On CT scan in past she had vertebral  Artery calcifications.

## 2012-07-12 ENCOUNTER — Encounter: Payer: Self-pay | Admitting: Family Medicine

## 2012-07-13 ENCOUNTER — Other Ambulatory Visit (HOSPITAL_COMMUNITY): Payer: Self-pay | Admitting: Family Medicine

## 2012-07-13 ENCOUNTER — Ambulatory Visit (HOSPITAL_COMMUNITY)
Admission: RE | Admit: 2012-07-13 | Discharge: 2012-07-13 | Disposition: A | Discharge status: Home / Self Care | Payer: 59 | Source: Ambulatory Visit | Attending: Family Medicine | Admitting: Family Medicine

## 2012-07-13 DIAGNOSIS — R55 Syncope and collapse: Secondary | ICD-10-CM | POA: Diagnosis not present

## 2012-07-13 NOTE — Progress Notes (Signed)
Bilateral carotid artery duplex:  No evidence of hemodynamically significant internal carotid artery stenosis.   Vertebral artery flow is antegrade.     

## 2012-07-16 ENCOUNTER — Encounter: Payer: Self-pay | Admitting: Family Medicine

## 2012-07-18 ENCOUNTER — Other Ambulatory Visit (HOSPITAL_COMMUNITY): Payer: 59

## 2012-09-06 ENCOUNTER — Telehealth: Payer: Self-pay | Admitting: *Deleted

## 2012-09-06 NOTE — Telephone Encounter (Signed)
Fax received from [facility] requesting refill on atorvastatin 10 mg - do not see on [facility] Wyatt Haste, RN-BSN

## 2012-09-06 NOTE — Telephone Encounter (Signed)
Right, the Atorvastatin was changed to Pravastatin on her last visit so we should deny the refill. They are stubborn about faxing rather than sending electronic requests

## 2012-11-09 ENCOUNTER — Ambulatory Visit (INDEPENDENT_AMBULATORY_CARE_PROVIDER_SITE_OTHER): Payer: 59 | Admitting: Family Medicine

## 2012-11-09 ENCOUNTER — Encounter: Payer: Self-pay | Admitting: Family Medicine

## 2012-11-09 ENCOUNTER — Telehealth: Payer: Self-pay | Admitting: Family Medicine

## 2012-11-09 VITALS — BP 147/70 | HR 81 | Temp 98.2°F | Wt 214.0 lb

## 2012-11-09 DIAGNOSIS — M545 Low back pain: Secondary | ICD-10-CM | POA: Diagnosis not present

## 2012-11-09 DIAGNOSIS — R300 Dysuria: Secondary | ICD-10-CM | POA: Diagnosis not present

## 2012-11-09 DIAGNOSIS — N390 Urinary tract infection, site not specified: Secondary | ICD-10-CM

## 2012-11-09 LAB — POCT URINALYSIS DIPSTICK
Blood, UA: NEGATIVE
Glucose, UA: NEGATIVE
Ketones, UA: NEGATIVE
Spec Grav, UA: 1.015

## 2012-11-09 MED ORDER — SULFAMETHOXAZOLE-TMP DS 800-160 MG PO TABS: 1.0000 | ORAL_TABLET | Freq: Two times a day (BID) | ORAL | Status: DC

## 2012-11-09 NOTE — Assessment & Plan Note (Signed)
UA unremarkable. Will send for culture given her concern. She has symptoms frequent to today in the past and improved with Bactrim. Will treat will Bactrim today and f/u with culture results. Patient agrees with plan. Tylenol prn pain. Return for care over weekend if her pain worsens or she becomes febrile.

## 2012-11-09 NOTE — Telephone Encounter (Signed)
Patient is feeling symptoms of a urinary infection and is asking for a refill on Septra.

## 2012-11-09 NOTE — Telephone Encounter (Signed)
Pt advised to sched OV to have urine checked. Pt agreed

## 2012-11-09 NOTE — Patient Instructions (Addendum)
We will call you on Tuesday if your urine shows anything. If anything changes, or gets worse, please seek medical care.  I have sent in the antibiotic for you.   Tiffany Becker M. Tiffany Becker, M.D.

## 2012-11-09 NOTE — Progress Notes (Signed)
Patient ID: Tiffany Becker, female   DOB: 01/16/1946, 67 y.o.   MRN: [id number]  [facility] Jasma Seevers M. Dazja Houchin, MD Phone: [phone]   Subjective: HPI: Patient is a 67 y.o. female presenting to clinic today for dysuria.  DYSURIA  Onset: 1 week ago   Worsening: yes    Symptoms Urgency: yes  Frequency: yes  Hesitancy: no  Hematuria: no  Flank Pain: yes, right side only  Fever: no   Nausea/Vomiting: no  Pregnant: no STD exposure/history: no Discharge: no Irritants: no Rash: no  Red Flags  : (Risk Factors for Complicated UTI) Recent Antibiotic Usage (last 30 days): no  Symptoms lasting more than seven (7) days: no  More than 3 UTI's last 12 months: no, was previously on Septra 1/2 tab prn but has not been on this for a while PMH of  1. DM: yes 2. Renal Disease/Calculi: no 3. Urinary Tract Abnormality: yes, urachal cyst as a teenager. Then had bladder spasms after last delivery 4. Instrumentation/Trauma: no 5. Immunosuppression: no  History Reviewed: Non smoker.  ROS: Please see HPI above.  Objective: Office vital signs reviewed. BP 147/70  Pulse 81  Temp(Src) 98.2 F (36.8 C) (Oral)  Wt 214 lb (97.07 kg)  BMI 33.27 kg/m2  Physical Examination:  General: Awake, alert. NAD HEENT: Atraumatic, normocephalic. MMM. TM wnl bilaterally Pulm: CTAB, no wheezes Cardio: RRR, no murmurs appreciated Abdomen:+BS, soft, nontender, nondistended. TTP right flank Neuro: Grossly intact  Assessment: 67 y.o. female with dysuria  Plan: See Problem List and After Visit Summary

## 2012-11-11 LAB — URINE CULTURE: Organism ID, Bacteria: NO GROWTH

## 2012-11-27 ENCOUNTER — Ambulatory Visit (INDEPENDENT_AMBULATORY_CARE_PROVIDER_SITE_OTHER): Payer: 59 | Admitting: *Deleted

## 2012-11-27 DIAGNOSIS — Z23 Encounter for immunization: Secondary | ICD-10-CM

## 2012-12-07 ENCOUNTER — Other Ambulatory Visit: Payer: Self-pay | Admitting: Family Medicine

## 2012-12-28 ENCOUNTER — Other Ambulatory Visit: Payer: Self-pay | Admitting: Family Medicine

## 2012-12-28 MED ORDER — CELECOXIB 100 MG PO CAPS: 100.0000 mg | ORAL_CAPSULE | Freq: Every day | ORAL | Status: DC | PRN

## 2013-01-09 DIAGNOSIS — T6391XA Toxic effect of contact with unspecified venomous animal, accidental (unintentional), initial encounter: Secondary | ICD-10-CM | POA: Diagnosis not present

## 2013-01-09 DIAGNOSIS — D485 Neoplasm of uncertain behavior of skin: Secondary | ICD-10-CM | POA: Diagnosis not present

## 2013-01-09 DIAGNOSIS — L723 Sebaceous cyst: Secondary | ICD-10-CM | POA: Diagnosis not present

## 2013-01-09 DIAGNOSIS — L821 Other seborrheic keratosis: Secondary | ICD-10-CM | POA: Diagnosis not present

## 2013-01-10 ENCOUNTER — Other Ambulatory Visit: Payer: Self-pay | Admitting: Family Medicine

## 2013-01-10 NOTE — Telephone Encounter (Signed)
Patient calls to make sure we received refill request for Metformin, Glyburide, and Pravastatin.

## 2013-04-09 ENCOUNTER — Telehealth: Payer: Self-pay | Admitting: Family Medicine

## 2013-04-09 MED ORDER — CELECOXIB 100 MG PO CAPS: 100.0000 mg | ORAL_CAPSULE | Freq: Every day | ORAL | Status: DC | PRN

## 2013-04-09 NOTE — Telephone Encounter (Signed)
Will fwd. To PCP. Thanks. Javier Glazier, Gerrit Heck

## 2013-04-09 NOTE — Telephone Encounter (Signed)
Would like refill on Celebrex.  is coming here at 11:30 to have mothers bloodwork done Would to pick up prescription then

## 2013-05-14 ENCOUNTER — Other Ambulatory Visit: Payer: Self-pay | Admitting: Family Medicine

## 2013-06-10 ENCOUNTER — Other Ambulatory Visit: Payer: Self-pay | Admitting: *Deleted

## 2013-06-10 ENCOUNTER — Telehealth: Payer: Self-pay | Admitting: Family Medicine

## 2013-06-10 MED ORDER — LISINOPRIL 20 MG PO TABS: ORAL_TABLET | ORAL | Status: DC

## 2013-06-10 NOTE — Telephone Encounter (Signed)
Note to nursing staff - please call patient and let her know that refill for Lisinopril has been sent to the pharmacy.

## 2013-06-10 NOTE — Telephone Encounter (Signed)
Will fwd to PCP for refill (pt has upcoming appt with you 06/18/13) Thanks. Javier Glazier, Gerrit Heck

## 2013-06-10 NOTE — Telephone Encounter (Signed)
Pt called and needs a refill on her lisinopril called in she is out. Tiffany Becker

## 2013-06-10 NOTE — Telephone Encounter (Signed)
Called pt. Informed. .Columbia Pandey  

## 2013-06-18 ENCOUNTER — Ambulatory Visit (INDEPENDENT_AMBULATORY_CARE_PROVIDER_SITE_OTHER): Payer: Medicare Other | Admitting: Family Medicine

## 2013-06-18 ENCOUNTER — Encounter: Payer: Self-pay | Admitting: Family Medicine

## 2013-06-18 VITALS — BP 142/78 | HR 76 | Ht 67.25 in | Wt 206.8 lb

## 2013-06-18 DIAGNOSIS — R634 Abnormal weight loss: Secondary | ICD-10-CM

## 2013-06-18 DIAGNOSIS — I10 Essential (primary) hypertension: Secondary | ICD-10-CM

## 2013-06-18 DIAGNOSIS — E119 Type 2 diabetes mellitus without complications: Secondary | ICD-10-CM | POA: Diagnosis not present

## 2013-06-18 DIAGNOSIS — E785 Hyperlipidemia, unspecified: Secondary | ICD-10-CM | POA: Diagnosis not present

## 2013-06-18 LAB — POCT GLYCOSYLATED HEMOGLOBIN (HGB A1C): Hemoglobin A1C: 6.3

## 2013-06-18 MED ORDER — GLYBURIDE 5 MG PO TABS: 2.5000 mg | ORAL_TABLET | Freq: Every day | ORAL | Status: DC

## 2013-06-18 MED ORDER — LISINOPRIL 30 MG PO TABS: ORAL_TABLET | ORAL | Status: DC

## 2013-06-18 NOTE — Patient Instructions (Signed)
Diabetes - check A1C today, continue Metformin and Glyburide, Dr. Ree Kida will call you with the results  High Blood Pressure - increase Lisinopril to 30 mg daily, keep log of blood pressure at home  High Cholesterol - continue Simvastatin  Decreased Appetite - likely due to stress, will continue to discuss at future visits  Return to office in one month for recheck of blood pressure and for annual visit with Dr. Ree Kida  Schedule Welcome to Medicare visit with Lamont Dowdy.

## 2013-06-19 ENCOUNTER — Encounter: Payer: Self-pay | Admitting: Family Medicine

## 2013-06-19 DIAGNOSIS — H2510 Age-related nuclear cataract, unspecified eye: Secondary | ICD-10-CM | POA: Diagnosis not present

## 2013-06-19 DIAGNOSIS — R634 Abnormal weight loss: Secondary | ICD-10-CM | POA: Insufficient documentation

## 2013-06-19 DIAGNOSIS — H40009 Preglaucoma, unspecified, unspecified eye: Secondary | ICD-10-CM | POA: Diagnosis not present

## 2013-06-19 LAB — BASIC METABOLIC PANEL
BUN: 18 mg/dL (ref 6–23)
CALCIUM: 9.7 mg/dL (ref 8.4–10.5)
CO2: 29 meq/L (ref 19–32)
CREATININE: 0.96 mg/dL (ref 0.50–1.10)
Chloride: 99 mEq/L (ref 96–112)
GLUCOSE: 95 mg/dL (ref 70–99)
Potassium: 4.7 mEq/L (ref 3.5–5.3)
Sodium: 135 mEq/L (ref 135–145)

## 2013-06-19 NOTE — Progress Notes (Addendum)
   Subjective:    Patient ID: Tiffany Becker, female    DOB: 31-Jul-1945, 68 y.o.   MRN: [id number]  HPI 68 y/o female presents for follow up of multiple medical issues.  Non-insulin dependent type 2 diabetes - currently on Glyburide and Metformin, patient has had previous hypoglycemic episodes that have been improved since cutting dose of Glyburide to 2.5 mg daily, has ophthalmology appointment tomorrow, does not follow with a podiatrist, denies numbness/tingling in hands or feet, no foot ulceration/wounds  Hypertension - currently on Lisinopril 20 mg daily, tolerating well, no side effects, no chest pain, no sob, no vision changes, no headaches  HLD - currently on Simvastatin, did not tolerate Lipitor previously due to muscle cramps  Obesity - reports recent weight loss, not intentional, she reports decrease appetite, she thinks that is is related to stress (see social section).  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  Respiratory: Negative for cough and shortness of breath.   Cardiovascular: Negative for chest pain and leg swelling.  Gastrointestinal: Negative for nausea, vomiting, abdominal pain and diarrhea.       Objective:   Physical Exam Vital: reviewed Gen: pleasant female, NAD HEENT: normocephalic, PERRL, EOMI, bilateral TM's pearly grey, MMM, neck supple, no cervical adenopathy Cardiac: RRR, S1 and S2 present, no murmurs, no heaves/thrills Resp: CTAB, normal effort Abd: soft, no tenderness, normal bowel sounds Ext: trace bilateral LE edema Foot: no ulcerations      Assessment & Plan:  Please see  problem specific assessment and plan.

## 2013-06-19 NOTE — Assessment & Plan Note (Signed)
Well controlled. A1C 6.3 -continue current regimen

## 2013-06-19 NOTE — Assessment & Plan Note (Signed)
Controlled on most recent lipid panel 06/2012 -continue current does of Pravastatin -consider repeat lipid profile in one year

## 2013-06-19 NOTE — Assessment & Plan Note (Signed)
10 pound weight loss in past 8 months. Loss of weight is not intentional. No reg flag symptoms for malignancy. 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 -Encouraged PO intake -Monitor clinically, if progresses will need to pursue further workup

## 2013-06-19 NOTE — Assessment & Plan Note (Signed)
Blood pressure slightly elevated (142/78) -Patient wishes to increase Lisinopril to 30 mg daily

## 2013-07-02 ENCOUNTER — Ambulatory Visit (INDEPENDENT_AMBULATORY_CARE_PROVIDER_SITE_OTHER): Payer: Medicare Other | Admitting: Home Health Services

## 2013-07-02 ENCOUNTER — Encounter: Payer: Self-pay | Admitting: Home Health Services

## 2013-07-02 VITALS — BP 127/73 | HR 66 | Temp 97.7°F | Ht 64.5 in | Wt 204.8 lb

## 2013-07-02 DIAGNOSIS — Z23 Encounter for immunization: Secondary | ICD-10-CM

## 2013-07-02 DIAGNOSIS — Z0000 Encounter for general adult medical examination without abnormal findings: Secondary | ICD-10-CM

## 2013-07-02 NOTE — Progress Notes (Signed)
Patient here for annual wellness visit, patient reports: Risk Factors/Conditions needing evaluation or treatment: Pt does not have any new risk factors that need evaluation. Home Safety: Pt lives in 2 story home with mother and husband.  She is primary care taker for both.  She reports having smoke detectors and adaptive equipment.  Other Information: Corrective lens: Pt wears corrective lens for reading, has annual eye exams. Dentures: Pt does not have dentures. Has annual eye exams. Memory: Pt denies any memory problems. Patient's Mini Mental Score (recorded in doc. flowsheet): 30 BMI/Exercise:  We discuss BMI and strategies for weight loss including start a regular exercise routine. ADL/IADL:  Pt reports independence in all functions. Med Adherence:  We discussed importance of taking all medications for DM, HTN, and cholesterol.  Pt reports 0 missed days in past week. Bladder:  Pt denies any problems with bladder control. Balance/Gait: Pt reports no falls in the past 12 months.  We dicussed strategies for home safety and fall prevention.      Annual Wellness Visit Requirements Recorded Today In  Medical, family, social history Past Medical, Family, Social History Section  Current providers Care team  Current medications Medications  Wt, BP, Ht, BMI Vital signs  Hearing assessment (welcome visit) Hearing/vision  Tobacco, alcohol, illicit drug use History  ADL Nurse Assessment  Depression Screening Nurse Assessment  Cognitive impairment Nurse Assessment  Mini Mental Status Document Flowsheet  Fall Risk Fall/Depression  Home Safety Progress Note  End of Life Planning (welcome visit) Social Documentation  Medicare preventative services Progress Note  Risk factors/conditions needing evaluation/treatment Progress Note  Personalized health advice Patient Instructions, goals, letter  Diet & Exercise Social Documentation  Emergency Contact Social Documentation  Seat Belts Social  Documentation  Sun exposure/protection Social Documentation

## 2013-07-05 ENCOUNTER — Other Ambulatory Visit: Payer: Self-pay | Admitting: Family Medicine

## 2013-07-05 ENCOUNTER — Encounter: Payer: Self-pay | Admitting: Home Health Services

## 2013-07-05 NOTE — Progress Notes (Signed)
Patient ID: Tiffany Becker, female   DOB: 07/05/1945, 68 y.o.   MRN: [id number]  I have reviewed this visit and discussed with Tiffany Becker and agree with her documentation.  Dossie Arbour MD

## 2013-07-11 ENCOUNTER — Other Ambulatory Visit: Payer: Self-pay | Admitting: *Deleted

## 2013-07-22 ENCOUNTER — Encounter: Payer: Self-pay | Admitting: Family Medicine

## 2013-07-22 ENCOUNTER — Ambulatory Visit (INDEPENDENT_AMBULATORY_CARE_PROVIDER_SITE_OTHER): Payer: Medicare Other | Admitting: Family Medicine

## 2013-07-22 VITALS — BP 155/71 | HR 68 | Temp 98.3°F | Ht 67.25 in | Wt 206.0 lb

## 2013-07-22 DIAGNOSIS — I10 Essential (primary) hypertension: Secondary | ICD-10-CM | POA: Diagnosis not present

## 2013-07-22 DIAGNOSIS — E119 Type 2 diabetes mellitus without complications: Secondary | ICD-10-CM

## 2013-07-22 DIAGNOSIS — D485 Neoplasm of uncertain behavior of skin: Secondary | ICD-10-CM

## 2013-07-22 DIAGNOSIS — Z0000 Encounter for general adult medical examination without abnormal findings: Secondary | ICD-10-CM | POA: Diagnosis not present

## 2013-07-22 LAB — CBC WITH DIFFERENTIAL/PLATELET
BASOS ABS: 0 10*3/uL (ref 0.0–0.1)
BASOS PCT: 0 % (ref 0–1)
EOS ABS: 0.1 10*3/uL (ref 0.0–0.7)
Eosinophils Relative: 1 % (ref 0–5)
HCT: 40.7 % (ref 36.0–46.0)
Hemoglobin: 14 g/dL (ref 12.0–15.0)
Lymphocytes Relative: 33 % (ref 12–46)
Lymphs Abs: 2.3 10*3/uL (ref 0.7–4.0)
MCH: 29.8 pg (ref 26.0–34.0)
MCHC: 34.4 g/dL (ref 30.0–36.0)
MCV: 86.6 fL (ref 78.0–100.0)
MONOS PCT: 9 % (ref 3–12)
Monocytes Absolute: 0.6 10*3/uL (ref 0.1–1.0)
NEUTROS ABS: 3.9 10*3/uL (ref 1.7–7.7)
NEUTROS PCT: 57 % (ref 43–77)
PLATELETS: 258 10*3/uL (ref 150–400)
RBC: 4.7 MIL/uL (ref 3.87–5.11)
RDW: 13.8 % (ref 11.5–15.5)
WBC: 6.9 10*3/uL (ref 4.0–10.5)

## 2013-07-22 LAB — TSH: TSH: 0.453 u[IU]/mL (ref 0.350–4.500)

## 2013-07-22 NOTE — Patient Instructions (Addendum)
Thank you for coming in today.   You are up to date on your immunizations and colon cancer screening. Please have your mammogram completed. Dr. Ree Kida will call you with your lab results.   Schedule a follow up in the dermatology clinic to have the area on your right arm evaluated.

## 2013-07-23 ENCOUNTER — Encounter: Payer: Self-pay | Admitting: Family Medicine

## 2013-07-23 DIAGNOSIS — D485 Neoplasm of uncertain behavior of skin: Secondary | ICD-10-CM | POA: Insufficient documentation

## 2013-07-23 NOTE — Assessment & Plan Note (Signed)
Blood pressure elevated today however home BP 120-140/60-70. -No change to therapy today however will need to monitor at subsequent visits.

## 2013-07-23 NOTE — Assessment & Plan Note (Signed)
Erythematous scaling lesion of right lateral elbow suspicious for AK vs SCC. -patient to make appointment at derm clinic at [facility] for punch biopsy.

## 2013-07-23 NOTE — Progress Notes (Signed)
Patient ID: Tiffany Becker, female   DOB: 02/22/1946, 68 y.o.   MRN: [id number]  68 y.o. year old female presents for well woman/preventative visit and annual GYN examination.  Acute Concerns: Patient has no acute concerns today.  Diet: Patient does not adhere to a specific dietary regimen.  Exercise: She doesn't exercise on a regular basis.  Birth Control: Patient is postmenopausal  Social:  History   Social History  . Marital Status: Married    Spouse Name: John    Number of Children: 2  . Years of Education: 12   Occupational History  . Retired-Sales of store fixtures    Social History Main Topics  . Smoking status: Never Smoker   . Smokeless tobacco: None  . Alcohol Use: No  . Drug Use: No  . Sexual Activity: None   Other Topics Concern  . None   Social History Narrative   Retired- Press photographer   2 daughters in the area      [place]:    Emergency Contact: husband, Jenny Reichmann [phone]   End of Life Plan: pt has advance directives but does not have in chart, requested copy from family.   Who lives with you: husband and mother   Any pets: dog   Diet: Pt has a varied diet of protein, starch and vegetables.   Exercise: Pt reports walking dog daily.   Seatbelts: Pt reports wearing seatbelt when in vehicles.    Nancy Fetter Exposure/Protection: Pt reports using sunscreen regularly.    Hobbies: church, walking, yard work, cooking             Immunization:  Tdap/TD: 2006  Influenza: 2014  Pneumococcal: 2014  Herpes Zoster: 2009  Cancer Screening:  Pap Smear: Patient has had previous hysterectomy and removal of bilateral ovaries  Mammogram: Patient is scheduled for 08/12/2013  Colonoscopy: Last performed in 2006, no polyps or suspicious lesions per patient account  Dexa: Has not been completed  Physical Exam: VITALS: Reviewed GEN: Pleasant Caucasian female, no acute distress HEENT: Normocephalic, pupils are equal round and reactive to light, extraocular movements  are intact, no scleral icterus, nasal septum midline, no rhinorrhea, moist mucous members, uvula midline, no pharyngeal erythema or exudate noted, neck supple, no anterior posterior cervical lymphadenopathy, no thyromegaly CARDIAC: Regular rate and rhythm, S1 and S2 present, no murmurs, no heaves or thrills RESP: Clear to auscultation bilaterally, normal effort BREAST:Exam performed in the presence of a chaperone. Scarring present in bilateral breast from previous breast reduction, no suspicious masses or lesions identified, no nipple discharge, normal axillary exam ABD: Patient has no belly button from previous surgeries, soft, nontender, bowel sounds present GU/GYN:Exam performed in the presence of a chaperone. Normal external female anatomy, bimanual examination identified no gross mass EXT: No edema, swelling or joint deformity of multiple PIP joints on bilateral hands SKIN: Patient has approximately 1 cm x 0.5 cm erythematous scaling lesion of her right lateral elbow (cannot fully differentiate between AK versus SCC). No other suspicious lesions identified on skin examination.  ASSESSMENT & PLAN: 68 y.o. female presents for annual well woman/preventative exam and GYN exam. Please see problem specific assessment and plan.

## 2013-07-23 NOTE — Assessment & Plan Note (Signed)
Patient presents for preventative healthcare examination. She is up-to-date on immunizations. She is up-to-date on cancer screening and is set up for mammogram on 08/12/2013. She has a living will which states that her husband is her health care power of attorney. Patient due for lab work including CBC and TSH. Annual Medicare visit completed earlier this year. -Return in one year for annual well visit

## 2013-07-24 ENCOUNTER — Encounter: Payer: Self-pay | Admitting: *Deleted

## 2013-07-24 NOTE — Progress Notes (Signed)
Prior authorization is requested from [facility] for Glyburide 5 mg tab.  Form placed in provider box for review.  Formulary alternatives that are covered:  Glipizide 2.5mg  and 5 mg tab; glimepiride 1 mg, 2 mg, 4 mg tab.    Derl Barrow, RN

## 2013-07-26 ENCOUNTER — Encounter: Payer: Self-pay | Admitting: Family Medicine

## 2013-07-26 DIAGNOSIS — H269 Unspecified cataract: Secondary | ICD-10-CM | POA: Insufficient documentation

## 2013-07-26 NOTE — Progress Notes (Signed)
Patient ID: Tiffany Becker, female   DOB: 04-May-1945, 68 y.o.   MRN: [id number] Prior authorization completed and returned to nursing staff.

## 2013-07-26 NOTE — Progress Notes (Signed)
Prior authorization form Glyburide faxed to [facility] for review.  Derl Barrow, RN

## 2013-08-02 NOTE — Progress Notes (Signed)
PA for glyburide was denied from [facility].  Derl Barrow, RN

## 2013-08-07 ENCOUNTER — Ambulatory Visit (INDEPENDENT_AMBULATORY_CARE_PROVIDER_SITE_OTHER): Payer: Medicare Other | Admitting: Family Medicine

## 2013-08-07 VITALS — BP 129/63 | HR 64 | Ht 67.25 in | Wt 206.0 lb

## 2013-08-07 DIAGNOSIS — L989 Disorder of the skin and subcutaneous tissue, unspecified: Secondary | ICD-10-CM

## 2013-08-07 DIAGNOSIS — L570 Actinic keratosis: Secondary | ICD-10-CM | POA: Diagnosis not present

## 2013-08-07 NOTE — Progress Notes (Addendum)
Subjective:     Patient ID: Tiffany Becker, female   DOB: 1945/07/05, 68 y.o.   MRN: [id number]  HPI Skin lesion: Here for skin examination,referred by her PCP for a suspicious lesion on her skin (right elbow) which has been recurrent for the last 3 yrs. Associated with itching. She has a place on her bum which is dark in color, but the other lesions are hypopigmented,she denies excessive sun exposure. Lesion on her bum has been there for over 10 yrs she denies any change in color or shape.She has hx of intentional weight loss. FH of cancer in her brother.  Current Outpatient Prescriptions on File Prior to Visit  Medication Sig Dispense Refill  . aspirin 81 MG EC tablet Take 1 tablet (81 mg total) by mouth daily. Swallow whole.  30 tablet  12  . calcium carbonate (TUMS E-X 750) 750 MG chewable tablet Chew 1 tablet by mouth as needed.       . celecoxib (CELEBREX) 100 MG capsule Take 1 capsule (100 mg total) by mouth daily as needed.  60 capsule  1  . glyBURIDE (DIABETA) 5 MG tablet Take 0.5 tablets (2.5 mg total) by mouth daily with breakfast.  30 tablet  3  . lisinopril (PRINIVIL,ZESTRIL) 30 MG tablet TAKE ONE TABLET BY MOUTH EVERY DAY  90 tablet  1  . metFORMIN (GLUCOPHAGE) 1000 MG tablet TAKE ONE TABLET BY MOUTH TWICE DAILY WITH A MEAL  60 tablet  3  . pravastatin (PRAVACHOL) 40 MG tablet TAKE ONE TABLET BY MOUTH ONCE DAILY  90 tablet  1   No current facility-administered medications on file prior to visit.   Past Medical History  Diagnosis Date  . Peptic ulcer disease 03/14/80    EGD  . Pyelonephritis 11/02  . Vitreous detachment   . Abnormal renal ultrasound 04/25/02  . GERD (gastroesophageal reflux disease)     H pylori neg 08/21/02  . Primary osteoarthritis of both hips 4/09    Dr Mayer Camel  . Diverticulosis of colon 08/12/04      Review of Systems  Skin:       Skin lesion   Filed Vitals:   08/07/13 1335  BP: 129/63  Pulse: 64  Height: 5' 7.25" (1.708 m)  Weight: 206 lb (93.441  kg)       Objective:   Physical Exam  Nursing note and vitals reviewed. Constitutional: She appears well-developed. No distress.  Cardiovascular: Normal rate, regular rhythm, normal heart sounds and intact distal pulses.   No murmur heard. Pulmonary/Chest: Effort normal and breath sounds normal. No respiratory distress. She has no wheezes.  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

## 2013-08-07 NOTE — Assessment & Plan Note (Addendum)
Lesion on right elbow area looks like AK cannot r/o SSC Plan is to biopsy and send for pathology. Informed written and verbal consent obtained from patient. Patient prep, and lesion area was cleaned with betadine and alcohol swab. 2% Lidocaine with epi used. 63mm punch biopsy instrument used to obtain specimen which was sent for pathology. Procedure went well with no complication. Bleeding stopped with compression and one stick of suture placed. Wound care instruction given, patient to f/u in 5 days for suture removal.  Lesion on her right bottom: Likely hemangioma. No change in size, shape or color for many years, patient stated it does not bother her. Plan is to monitor for now since this is likely benign. Picture taken for future reference.

## 2013-08-07 NOTE — Patient Instructions (Signed)
Please schedule nurse appointment for suture removal in 5 days. I will call yo with your pathology report.   Wound Care Wound care helps prevent pain and infection.  You may need a tetanus shot if:  You cannot remember when you had your last tetanus shot.  You have never had a tetanus shot.  The injury broke your skin. If you need a tetanus shot and you choose not to have one, you may get tetanus. Sickness from tetanus can be serious. HOME CARE   Only take medicine as told by your doctor.  Clean the wound daily with mild soap and water.  Change any bandages (dressings) as told by your doctor.  Put medicated cream and a bandage on the wound as told by your doctor.  Change the bandage if it gets wet, dirty, or starts to smell.  Take showers. Do not take baths, swim, or do anything that puts your wound under water.  Rest and raise (elevate) the wound until the pain and puffiness (swelling) are better.  Keep all doctor visits as told. GET HELP RIGHT AWAY IF:   Yellowish-white fluid (pus) comes from the wound.  Medicine does not lessen your pain.  There is a red streak going away from the wound.  You have a fever. MAKE SURE YOU:   Understand these instructions.  Will watch your condition.  Will get help right away if you are not doing well or get worse. Document Released: 12/08/2007 Document Revised: 05/23/2011 Document Reviewed: 07/04/2010 [facility] Patient Information 2014 [place], [place].

## 2013-08-12 ENCOUNTER — Telehealth: Payer: Self-pay | Admitting: Family Medicine

## 2013-08-12 ENCOUNTER — Ambulatory Visit (INDEPENDENT_AMBULATORY_CARE_PROVIDER_SITE_OTHER): Payer: Medicare Other | Admitting: *Deleted

## 2013-08-12 DIAGNOSIS — Z4802 Encounter for removal of sutures: Secondary | ICD-10-CM

## 2013-08-12 NOTE — Progress Notes (Signed)
   Pt in clinic for suture removal right upper arm.  One suture intact; area cleaned and one suture removed without difficultly.  Area without redness, tenderness and swelling.  Derl Barrow, RN

## 2013-08-12 NOTE — Telephone Encounter (Signed)
Pathology report discussed with patient ( Actinic Keratosis with no malignancy). Recommendation given to patient; Prolong sunlight exposure to be avoided. Use Sun screen while in the Sun. F/U prn at [facility] clinic for skin exam.

## 2013-08-23 ENCOUNTER — Other Ambulatory Visit: Payer: Self-pay | Admitting: Family Medicine

## 2013-08-29 ENCOUNTER — Other Ambulatory Visit: Payer: Self-pay | Admitting: Family Medicine

## 2013-09-10 ENCOUNTER — Other Ambulatory Visit: Payer: Self-pay | Admitting: Family Medicine

## 2013-09-10 MED ORDER — MELOXICAM 7.5 MG PO TABS: 7.5000 mg | ORAL_TABLET | Freq: Every day | ORAL | Status: DC

## 2013-09-10 NOTE — Telephone Encounter (Signed)
Celecoxib denied by [facility], will change to Mobic 7.5 mg daily as needed for pain

## 2013-09-16 DIAGNOSIS — Z1231 Encounter for screening mammogram for malignant neoplasm of breast: Secondary | ICD-10-CM | POA: Diagnosis not present

## 2013-10-07 ENCOUNTER — Encounter: Payer: Self-pay | Admitting: Family Medicine

## 2013-10-07 ENCOUNTER — Ambulatory Visit (INDEPENDENT_AMBULATORY_CARE_PROVIDER_SITE_OTHER): Payer: Medicare Other | Admitting: Family Medicine

## 2013-10-07 VITALS — BP 143/68 | HR 66 | Temp 98.0°F

## 2013-10-07 DIAGNOSIS — M1710 Unilateral primary osteoarthritis, unspecified knee: Secondary | ICD-10-CM | POA: Diagnosis not present

## 2013-10-07 DIAGNOSIS — M170 Bilateral primary osteoarthritis of knee: Secondary | ICD-10-CM

## 2013-10-07 DIAGNOSIS — M6530 Trigger finger, unspecified finger: Secondary | ICD-10-CM | POA: Diagnosis not present

## 2013-10-07 NOTE — Patient Instructions (Signed)
Handicap Placard paperwork completed.  Please take Mobic daily, continue to ice affected areas. Can consider steroid injections of right middle finger and knees in the future.

## 2013-10-07 NOTE — Progress Notes (Signed)
   Subjective:    Patient ID: Tiffany Becker, female    DOB: 12-19-1945, 67 y.o.   MRN: [id number]  HPI 68 y/o female presents for evaluation for handicap placard.   Patient has a history of bilateral knee OA (with previous menisectomy on the right 4-5 years ago), she has previously followed with Dr. Theda Sers ([place]), she has received both steroid and gel supplementation injections with some relief in the past, she has also had previous imaging of both knees through [facility], reports intermittent giving out/locking of right knee, has daily anterior knee pain, currently taking Mobic 7.5 mg intermittently which provides some pain relief, ices intermittently  Ms. Okray also reports trigger finger of right 3rd digit, has had previous surgery for trigger finger however reports recent return of symptoms   Review of Systems  Constitutional: Negative for fever and chills.  Respiratory: Negative for cough and shortness of breath.   Gastrointestinal: Negative for nausea, vomiting and diarrhea.  Musculoskeletal: Positive for arthralgias.       Objective:   Physical Exam Vitals: reviewed Gen: pleasant female, NAD MSK: Right knee - scar from previous meniscus surgery, no swelling, medial and lateral joint line tenderness, ligament testing intact (lachman, varus stress test, valgus stress test, pain and some clicking with McMurry Left knee-no swelling, medial and lateral joint line tenderness, ligament testing intact (lachman, varus stress test, valgus stress test, pain and some clicking with McMurry Right hip - negative log roll, pain with FABIR Left hip - negative log roll, negative FABIR Right 3rd finger - tenderness over the A1 pulley, no triggering noted today on exam, scar from previous surgery present Neuro: strength 5/5 in both lower extremities, sensation to light touch grossly intact (except over later left knee/shin which patient states is not new)        Assessment & Plan:  Please see problem specific assessment and plan.

## 2013-10-07 NOTE — Assessment & Plan Note (Signed)
Patient presents for evaluation of right trigger finger, has had previous surgical release. Symptoms have started to recur over the past few weeks.  -encouraged daily Mobic and icing as tolerated -dicussed possible steroid injection at future visits

## 2013-10-07 NOTE — Assessment & Plan Note (Signed)
Patient presents for evaluation for handicap placard. Patient has difficulty with ambulation due to bilateral knee OA. Previously seen by Dr. Theda Sers at [facility]. -continue icing as tolerated -Take Mobic daily  -Consider repeat knee steroid injections in the future -Handicap Placard provided

## 2013-12-09 ENCOUNTER — Ambulatory Visit (INDEPENDENT_AMBULATORY_CARE_PROVIDER_SITE_OTHER): Payer: Medicare Other | Admitting: *Deleted

## 2013-12-09 DIAGNOSIS — Z23 Encounter for immunization: Secondary | ICD-10-CM | POA: Diagnosis not present

## 2013-12-30 ENCOUNTER — Encounter: Payer: Self-pay | Admitting: Family Medicine

## 2014-01-19 ENCOUNTER — Other Ambulatory Visit: Payer: Self-pay | Admitting: Family Medicine

## 2014-03-04 ENCOUNTER — Telehealth: Payer: Self-pay | Admitting: Family Medicine

## 2014-03-04 MED ORDER — HYDROCOD POLST-CHLORPHEN POLST 10-8 MG/5ML PO LQCR: 5.0000 mL | Freq: Two times a day (BID) | ORAL | Status: DC | PRN

## 2014-03-04 NOTE — Telephone Encounter (Signed)
Patient is in clinic with her mother today and both have had viral infection a few days. Patient has cough keeping her awake at night. Tussionex is the only thing that has worked for her. Will provide small Rx of tussionex to help with current symptoms. Otherwise well-appearing and breathing easily. No chronic narcotic use noted on problem list. Return precautions reviewed.    Hilton Sinclair, MD

## 2014-03-17 ENCOUNTER — Encounter: Payer: Self-pay | Admitting: Family Medicine

## 2014-03-17 ENCOUNTER — Ambulatory Visit (INDEPENDENT_AMBULATORY_CARE_PROVIDER_SITE_OTHER): Payer: Medicare Other | Admitting: Family Medicine

## 2014-03-17 ENCOUNTER — Other Ambulatory Visit: Payer: Self-pay | Admitting: Family Medicine

## 2014-03-17 VITALS — BP 169/75 | HR 72 | Temp 98.2°F | Ht 67.5 in | Wt 210.6 lb

## 2014-03-17 DIAGNOSIS — J0100 Acute maxillary sinusitis, unspecified: Secondary | ICD-10-CM | POA: Diagnosis not present

## 2014-03-17 MED ORDER — METFORMIN HCL 1000 MG PO TABS: 1000.0000 mg | ORAL_TABLET | Freq: Two times a day (BID) | ORAL | Status: DC

## 2014-03-17 MED ORDER — DOXYCYCLINE HYCLATE 100 MG PO TABS: 100.0000 mg | ORAL_TABLET | Freq: Two times a day (BID) | ORAL | Status: DC

## 2014-03-17 NOTE — Patient Instructions (Signed)
Sinusitis Sinusitis is redness, soreness, and inflammation of the paranasal sinuses. Paranasal sinuses are air pockets within the bones of your face (beneath the eyes, the middle of the forehead, or above the eyes). In healthy paranasal sinuses, mucus is able to drain out, and air is able to circulate through them by way of your nose. However, when your paranasal sinuses are inflamed, mucus and air can become trapped. This can allow bacteria and other germs to grow and cause infection. Sinusitis can develop quickly and last only a short time (acute) or continue over a long period (chronic). Sinusitis that lasts for more than 12 weeks is considered chronic.  CAUSES  Causes of sinusitis include:  Allergies.  Structural abnormalities, such as displacement of the cartilage that separates your nostrils (deviated septum), which can decrease the air flow through your nose and sinuses and affect sinus drainage.  Functional abnormalities, such as when the small hairs (cilia) that line your sinuses and help remove mucus do not work properly or are not present. SIGNS AND SYMPTOMS  Symptoms of acute and chronic sinusitis are the same. The primary symptoms are pain and pressure around the affected sinuses. Other symptoms include:  Upper toothache.  Earache.  Headache.  Bad breath.  Decreased sense of smell and taste.  A cough, which worsens when you are lying flat.  Fatigue.  Fever.  Thick drainage from your nose, which often is green and may contain pus (purulent).  Swelling and warmth over the affected sinuses. DIAGNOSIS  Your health care provider will perform a physical exam. During the exam, your health care provider may:  Look in your nose for signs of abnormal growths in your nostrils (nasal polyps).  Tap over the affected sinus to check for signs of infection.  View the inside of your sinuses (endoscopy) using an imaging device that has a light attached (endoscope). If your health  care provider suspects that you have chronic sinusitis, one or more of the following tests may be recommended:  Allergy tests.  Nasal culture. A sample of mucus is taken from your nose, sent to a lab, and screened for bacteria.  Nasal cytology. A sample of mucus is taken from your nose and examined by your health care provider to determine if your sinusitis is related to an allergy. TREATMENT  Most cases of acute sinusitis are related to a viral infection and will resolve on their own within 10 days. Sometimes medicines are prescribed to help relieve symptoms (pain medicine, decongestants, nasal steroid sprays, or saline sprays).  However, for sinusitis related to a bacterial infection, your health care provider will prescribe antibiotic medicines. These are medicines that will help kill the bacteria causing the infection.  Rarely, sinusitis is caused by a fungal infection. In theses cases, your health care provider will prescribe antifungal medicine. For some cases of chronic sinusitis, surgery is needed. Generally, these are cases in which sinusitis recurs more than 3 times per year, despite other treatments. HOME CARE INSTRUCTIONS   Drink plenty of water. Water helps thin the mucus so your sinuses can drain more easily.  Use a humidifier.  Inhale steam 3 to 4 times a day (for example, sit in the bathroom with the shower running).  Apply a warm, moist washcloth to your face 3 to 4 times a day, or as directed by your health care provider.  Use saline nasal sprays to help moisten and clean your sinuses.  Take medicines only as directed by your health care provider.    If you were prescribed either an antibiotic or antifungal medicine, finish it all even if you start to feel better. SEEK IMMEDIATE MEDICAL CARE IF:  You have increasing pain or severe headaches.  You have nausea, vomiting, or drowsiness.  You have swelling around your face.  You have vision problems.  You have a stiff  neck.  You have difficulty breathing. MAKE SURE YOU:   Understand these instructions.  Will watch your condition.  Will get help right away if you are not doing well or get worse. Document Released: 02/28/2005 Document Revised: 07/15/2013 Document Reviewed: 03/15/2011 [facility] Patient Information 2015 [place], [place]. This information is not intended to replace advice given to you by your health care provider. Make sure you discuss any questions you have with your health care provider.   I have called in doxycycline for you to take 2 times a day for the next 10 days. Addition pickups and Flonase over-the-counter and use it for at least the next 30 days, this will help resolve your sinusitis quicker, and this helps with allergy-like symptoms as well.

## 2014-03-17 NOTE — Telephone Encounter (Signed)
Pt called and needs a refill on her Metformin called in. jw °

## 2014-03-17 NOTE — Progress Notes (Signed)
   Subjective:    Patient ID: Tiffany Becker, female    DOB: 21-Jul-1945, 69 y.o.   MRN: [id number]  HPI  Right ear pain: Patient presents to same-day clinic today with complaints of right ear pain. States the other members of her household have all been ill with an upper respiratory infection over the last few weeks. Her symptoms started approximately 1-2 weeks ago with a dry cough, rhinorrhea, facial pressure, headache. She denies any fevers or sore throat. She is eating and drinking okay. She's had sinus problems in the past. He is more concerned over her right ear pain that started 2 days ago, and is not improving.  Elevated BP: Of note her blood pressure is elevated today 169/75 she states she takes her blood pressure at home daily and her blood pressures have been normal. Feels that she is upset from some of the conversation she was hearing in the waiting area. She will monitor her blood pressure, and call to make an appointment if they remain elevated at home.  Nonsmoker  Past Medical History  Diagnosis Date  . Peptic ulcer disease 03/14/80    EGD  . Pyelonephritis 11/02  . Vitreous detachment   . Abnormal renal ultrasound 04/25/02  . GERD (gastroesophageal reflux disease)     H pylori neg 08/21/02  . Primary osteoarthritis of both hips 4/09    Dr Mayer Camel  . Diverticulosis of colon 08/12/04   Allergies  Allergen Reactions  . Simvastatin     REACTION: hair loss   Review of Systems Per history of present illness    Objective:   Physical Exam BP 169/75 mmHg  Pulse 72  Temp(Src) 98.2 F (36.8 C) (Oral)  Ht 5' 7.5" (1.715 m)  Wt 210 lb 9.6 oz (95.528 kg)  BMI 32.48 kg/m2 Gen: Very pleasant Caucasian female, no acute distress, nontoxic in appearance, well-developed, well-nourished, mildly obese. HEENT: AT. [place]. Bilateral TM visualized and shiny, fluid-filled appearance, no erythema or bulging. Bilateral eyes without injections or icterus. MMM. Bilateral nares  erythema and swollen  turbinates. Throat without erythema or exudates, cobblestoning present. Facial tenderness over maxillary sinuses left greater than right. CV: RRR Chest: CTAB, no wheeze or crackles    Assessment & Plan:

## 2014-03-17 NOTE — Assessment & Plan Note (Signed)
Patient presents with acute sinusitis.  - Treated with doxycycline and Flonase. - Follow-up as needed

## 2014-03-19 DIAGNOSIS — C44310 Basal cell carcinoma of skin of unspecified parts of face: Secondary | ICD-10-CM | POA: Diagnosis not present

## 2014-03-19 DIAGNOSIS — Z1283 Encounter for screening for malignant neoplasm of skin: Secondary | ICD-10-CM | POA: Diagnosis not present

## 2014-03-24 ENCOUNTER — Other Ambulatory Visit: Payer: Self-pay | Admitting: *Deleted

## 2014-03-25 MED ORDER — GLYBURIDE 5 MG PO TABS: 2.5000 mg | ORAL_TABLET | Freq: Every day | ORAL | Status: DC

## 2014-04-11 ENCOUNTER — Emergency Department (HOSPITAL_BASED_OUTPATIENT_CLINIC_OR_DEPARTMENT_OTHER): Payer: Medicare Other

## 2014-04-11 ENCOUNTER — Encounter (HOSPITAL_BASED_OUTPATIENT_CLINIC_OR_DEPARTMENT_OTHER): Payer: Self-pay

## 2014-04-11 ENCOUNTER — Emergency Department (HOSPITAL_BASED_OUTPATIENT_CLINIC_OR_DEPARTMENT_OTHER)
Admission: EM | Admit: 2014-04-11 | Discharge: 2014-04-11 | Disposition: A | Discharge status: Home / Self Care | Payer: Medicare Other | Attending: Emergency Medicine | Admitting: Emergency Medicine

## 2014-04-11 DIAGNOSIS — Z7982 Long term (current) use of aspirin: Secondary | ICD-10-CM | POA: Diagnosis not present

## 2014-04-11 DIAGNOSIS — I10 Essential (primary) hypertension: Secondary | ICD-10-CM | POA: Diagnosis not present

## 2014-04-11 DIAGNOSIS — M7121 Synovial cyst of popliteal space [Baker], right knee: Secondary | ICD-10-CM | POA: Diagnosis not present

## 2014-04-11 DIAGNOSIS — Z791 Long term (current) use of non-steroidal anti-inflammatories (NSAID): Secondary | ICD-10-CM | POA: Diagnosis not present

## 2014-04-11 DIAGNOSIS — E119 Type 2 diabetes mellitus without complications: Secondary | ICD-10-CM | POA: Insufficient documentation

## 2014-04-11 DIAGNOSIS — M791 Myalgia, unspecified site: Secondary | ICD-10-CM

## 2014-04-11 DIAGNOSIS — Z79899 Other long term (current) drug therapy: Secondary | ICD-10-CM | POA: Insufficient documentation

## 2014-04-11 DIAGNOSIS — E871 Hypo-osmolality and hyponatremia: Secondary | ICD-10-CM | POA: Diagnosis not present

## 2014-04-11 DIAGNOSIS — M160 Bilateral primary osteoarthritis of hip: Secondary | ICD-10-CM | POA: Diagnosis not present

## 2014-04-11 DIAGNOSIS — Z792 Long term (current) use of antibiotics: Secondary | ICD-10-CM | POA: Diagnosis not present

## 2014-04-11 DIAGNOSIS — M79661 Pain in right lower leg: Secondary | ICD-10-CM | POA: Diagnosis present

## 2014-04-11 DIAGNOSIS — Z8711 Personal history of peptic ulcer disease: Secondary | ICD-10-CM | POA: Insufficient documentation

## 2014-04-11 DIAGNOSIS — R000 Tachycardia, unspecified: Secondary | ICD-10-CM | POA: Insufficient documentation

## 2014-04-11 DIAGNOSIS — Z87448 Personal history of other diseases of urinary system: Secondary | ICD-10-CM | POA: Insufficient documentation

## 2014-04-11 DIAGNOSIS — Z8669 Personal history of other diseases of the nervous system and sense organs: Secondary | ICD-10-CM | POA: Diagnosis not present

## 2014-04-11 HISTORY — DX: Essential (primary) hypertension: I10

## 2014-04-11 HISTORY — DX: Type 2 diabetes mellitus without complications: E11.9

## 2014-04-11 LAB — CBC WITH DIFFERENTIAL/PLATELET
Basophils Absolute: 0 10*3/uL (ref 0.0–0.1)
Basophils Relative: 0 % (ref 0–1)
EOS ABS: 0.1 10*3/uL (ref 0.0–0.7)
Eosinophils Relative: 1 % (ref 0–5)
HCT: 41.1 % (ref 36.0–46.0)
HEMOGLOBIN: 14 g/dL (ref 12.0–15.0)
LYMPHS PCT: 29 % (ref 12–46)
Lymphs Abs: 2.8 10*3/uL (ref 0.7–4.0)
MCH: 30 pg (ref 26.0–34.0)
MCHC: 34.1 g/dL (ref 30.0–36.0)
MCV: 88.2 fL (ref 78.0–100.0)
MONO ABS: 0.7 10*3/uL (ref 0.1–1.0)
Monocytes Relative: 7 % (ref 3–12)
NEUTROS ABS: 5.9 10*3/uL (ref 1.7–7.7)
NEUTROS PCT: 63 % (ref 43–77)
Platelets: 243 10*3/uL (ref 150–400)
RBC: 4.66 MIL/uL (ref 3.87–5.11)
RDW: 12.5 % (ref 11.5–15.5)
WBC: 9.5 10*3/uL (ref 4.0–10.5)

## 2014-04-11 LAB — COMPREHENSIVE METABOLIC PANEL
ALBUMIN: 4.3 g/dL (ref 3.5–5.2)
ALK PHOS: 72 U/L (ref 39–117)
ALT: 21 U/L (ref 0–35)
AST: 22 U/L (ref 0–37)
Anion gap: 8 (ref 5–15)
BUN: 16 mg/dL (ref 6–23)
CALCIUM: 9 mg/dL (ref 8.4–10.5)
CO2: 22 mmol/L (ref 19–32)
Chloride: 100 mmol/L (ref 96–112)
Creatinine, Ser: 0.87 mg/dL (ref 0.50–1.10)
GFR calc Af Amer: 78 mL/min — ABNORMAL LOW (ref >90)
GFR calc non Af Amer: 67 mL/min — ABNORMAL LOW (ref >90)
GLUCOSE: 218 mg/dL — AB (ref 70–99)
POTASSIUM: 4 mmol/L (ref 3.5–5.1)
Sodium: 130 mmol/L — ABNORMAL LOW (ref 135–145)
TOTAL PROTEIN: 6.8 g/dL (ref 6.0–8.3)
Total Bilirubin: 0.6 mg/dL (ref 0.3–1.2)

## 2014-04-11 LAB — CK: Total CK: 121 U/L (ref 7–177)

## 2014-04-11 MED ORDER — IBUPROFEN 800 MG PO TABS: 800.0000 mg | ORAL_TABLET | Freq: Three times a day (TID) | ORAL | Status: DC

## 2014-04-11 MED ORDER — GLYBURIDE 5 MG PO TABS: 2.5000 mg | ORAL_TABLET | Freq: Every day | ORAL | Status: DC

## 2014-04-11 NOTE — ED Provider Notes (Signed)
CSN: [id number]     Arrival date & time 04/11/14  1636 History   First MD Initiated Contact with Patient 04/11/14 1646     Chief Complaint  Patient presents with  . Leg Pain     (Consider location/radiation/quality/duration/timing/severity/associated sxs/prior Treatment) HPI Comments: Patient presents with pain to her right lateral lower leg since last night without injury. It is been intermittent for the past several days but constant since last night. She has not tried to take any for it. Denies injury. Denies any focal weakness, numbness or tingling. No bowel or bladder incontinence. The pain radiates to her hip and causes soreness. Denies back pain. Denies bowel or bladder incontinence. Nothing makes the pain better or worse. Denies any chest pain, shortness of breath or recent travel. She is a diabetic. She has a history of muscle cramps from taking Lipitor. She takes pravastatin 3 times a week. She had symptoms on the left side several days ago which resolved.  The history is provided by the patient.    Past Medical History  Diagnosis Date  . Peptic ulcer disease 03/14/80    EGD  . Pyelonephritis 11/02  . Vitreous detachment   . Abnormal renal ultrasound 04/25/02  . GERD (gastroesophageal reflux disease)     H pylori neg 08/21/02  . Primary osteoarthritis of both hips 4/09    Dr Mayer Camel  . Diverticulosis of colon 08/12/04  . Hypertension   . Diabetes mellitus without complication    Past Surgical History  Procedure Laterality Date  . Tonsillectomy  03/14/52  . Umbilical hernia repair  03/14/64  . Tubal ligation  03/14/72  . Cholecystectomy  01/12/89  . Total abdominal hysterectomy  01/12/89    L salpingoophorectomy for ruptured cyst  . Breast reduction surgery  03/14/93  . Excision morton's neuroma  03/14/94  . Salpingoophorectomy  09/11/97    ruptured cyst  . Cervical discectomy  03/14/06    C6-7  . Bunionectomy  12/09    L with 2nd and 5th toe straightening   Family History  Problem  Relation Age of Onset  . Coronary artery disease Father 52    died age 11  . Osteoarthritis Mother   . Asthma Mother   . Diabetes Brother     bladder, pacemaker, Hemochromatosis  . Cancer Brother    History  Substance Use Topics  . Smoking status: Never Smoker   . Smokeless tobacco: Not on file  . Alcohol Use: No   OB History    No data available     Review of Systems  Constitutional: Negative for fever, activity change and appetite change.  HENT: Negative for congestion and rhinorrhea.   Respiratory: Negative for cough, chest tightness and shortness of breath.   Cardiovascular: Negative for chest pain.  Gastrointestinal: Negative for nausea, vomiting and abdominal pain.  Genitourinary: Negative for dysuria, hematuria, vaginal bleeding and vaginal discharge.  Musculoskeletal: Positive for myalgias and arthralgias. Negative for back pain, gait problem, neck pain and neck stiffness.  Skin: Negative for rash.  Neurological: Negative for dizziness, weakness and headaches.  A complete 10 system review of systems was obtained and all systems are negative except as noted in the HPI and PMH.      Allergies  Simvastatin  Home Medications   Prior to Admission medications   Medication Sig Start Date End Date Taking? Authorizing Provider  aspirin 81 MG EC tablet Take 1 tablet (81 mg total) by mouth daily. Swallow whole. 07/11/12  Candelaria Celeste, MD  calcium carbonate (TUMS E-X 750) 750 MG chewable tablet Chew 1 tablet by mouth as needed.     Historical Provider, MD  chlorpheniramine-HYDROcodone (TUSSIONEX) 10-8 MG/5ML LQCR Take 5 mLs by mouth every 12 (twelve) hours as needed for cough (cough, will cause drowsiness.). 03/04/14   Hilton Sinclair, MD  doxycycline (VIBRA-TABS) 100 MG tablet Take 1 tablet (100 mg total) by mouth 2 (two) times daily. 03/17/14   Renee A Kuneff, DO  glyBURIDE (DIABETA) 5 MG tablet Take 0.5 tablets (2.5 mg total) by mouth daily with breakfast. 04/11/14    Ezequiel Essex, MD  ibuprofen (ADVIL,MOTRIN) 800 MG tablet Take 1 tablet (800 mg total) by mouth 3 (three) times daily. 04/11/14   Ezequiel Essex, MD  lisinopril (PRINIVIL,ZESTRIL) 30 MG tablet TAKE ONE TABLET BY MOUTH ONCE DAILY 01/20/14   Lupita Dawn, MD  meloxicam (MOBIC) 7.5 MG tablet Take 1 tablet (7.5 mg total) by mouth daily. 09/10/13   Lupita Dawn, MD  metFORMIN (GLUCOPHAGE) 1000 MG tablet Take 1 tablet (1,000 mg total) by mouth 2 (two) times daily with a meal. 03/17/14   Lupita Dawn, MD  pravastatin (PRAVACHOL) 40 MG tablet TAKE ONE TABLET BY MOUTH ONCE DAILY 07/05/13   Lupita Dawn, MD   BP 139/58 mmHg  Pulse 73  Temp(Src) 98 F (36.7 C) (Oral)  Resp 16  Ht 5\' 8"  (1.727 m)  Wt 208 lb (94.348 kg)  BMI 31.63 kg/m2  SpO2 96% Physical Exam  Constitutional: She is oriented to person, place, and time. She appears well-developed and well-nourished. No distress.  HENT:  Head: Normocephalic and atraumatic.  Mouth/Throat: Oropharynx is clear and moist. No oropharyngeal exudate.  Eyes: Conjunctivae and EOM are normal. Pupils are equal, round, and reactive to light.  Neck: Normal range of motion. Neck supple.  No meningismus.  Cardiovascular: Normal rate, normal heart sounds and intact distal pulses.   No murmur heard. Mild tachycardia  Pulmonary/Chest: Effort normal and breath sounds normal. No respiratory distress.  Abdominal: Soft. There is no tenderness. There is no rebound and no guarding.  Musculoskeletal: Normal range of motion. She exhibits tenderness. She exhibits no edema.  TTP R lateral lower leg.  No erythema or swelling. Intact DP and PT pulses  5/5 strength in bilateral lower extremities. Ankle plantar and dorsiflexion intact. Great toe extension intact bilaterally. +2 DP and PT pulses. +2 patellar reflexes bilaterally. Normal gait.   Neurological: She is alert and oriented to person, place, and time. No cranial nerve deficit. She exhibits normal muscle tone.  Coordination normal.  No ataxia on finger to nose bilaterally. No pronator drift. 5/5 strength throughout. CN 2-12 intact. Negative Romberg. Equal grip strength. Sensation intact. Gait is normal.   Skin: Skin is warm.  Psychiatric: She has a normal mood and affect. Her behavior is normal.  Nursing note and vitals reviewed.   ED Course  Procedures (including critical care time) Labs Review Labs Reviewed  COMPREHENSIVE METABOLIC PANEL - Abnormal; Notable for the following:    Sodium 130 (*)    Glucose, Bld 218 (*)    GFR calc non Af Amer 67 (*)    GFR calc Af Amer 78 (*)    All other components within normal limits  CK  CBC WITH DIFFERENTIAL/PLATELET    Imaging Review US Venous Img Lower Unilateral Right  04/11/2014   CLINICAL DATA:  Right lower lateral calf pain for 6 months, worse today after prolonged standing yesterday.  EXAM: Right LOWER EXTREMITY VENOUS DOPPLER ULTRASOUND  TECHNIQUE: Gray-scale sonography with graded compression, as well as color Doppler and duplex ultrasound were performed to evaluate the lower extremity deep venous systems from the level of the common femoral vein and including the common femoral, femoral, profunda femoral, popliteal and calf veins including the posterior tibial, peroneal and gastrocnemius veins when visible. The superficial great saphenous vein was also interrogated. Spectral Doppler was utilized to evaluate flow at rest and with distal augmentation maneuvers in the common femoral, femoral and popliteal veins.  COMPARISON:  None.  FINDINGS: Contralateral Common Femoral Vein: Respiratory phasicity is normal and symmetric with the symptomatic side. No evidence of thrombus. Normal compressibility.  Common Femoral Vein: No evidence of thrombus. Normal compressibility, respiratory phasicity and response to augmentation.  Saphenofemoral Junction: No evidence of thrombus. Normal compressibility and flow on color Doppler imaging.  Profunda Femoral Vein: No  evidence of thrombus. Normal compressibility and flow on color Doppler imaging.  Femoral Vein: No evidence of thrombus. Normal compressibility, respiratory phasicity and response to augmentation.  Popliteal Vein: No evidence of thrombus. Normal compressibility, respiratory phasicity and response to augmentation.  Calf Veins: No evidence of thrombus. Normal compressibility and flow on color Doppler imaging.  Superficial Great Saphenous Vein: No evidence of thrombus. Normal compressibility and flow on color Doppler imaging.  Venous Reflux:  None.  Other Findings: There is a 1 x 2.5 x 2.5 cm cyst in the medial right popliteal fossa.  Directed sonographic evaluation of the area of pain in the lateral right calf demonstrates only normal appearing tissues.  IMPRESSION: No evidence of deep venous thrombosis.  2.5 cm popliteal cyst.  No sonographic abnormality in the area of concern, lateral right calf.   Electronically Signed   By: Andreas Newport M.D.   On: 04/11/2014 17:58     EKG Interpretation None      MDM   Final diagnoses:  Myalgia   Leg cramps worse on the right side. No chest pain or shortness of breath. Intact distal pulses. Patient is on a statin which may be the cause of her symptoms. She is concerned about a blood clot.  CK normal. Mild hyponatremia of 130. Normal anion gap. Doppler negative for DVT. There is a 2.5 cm Baker's cyst. Results discussed with patient. Discussed holding statin until she sees her PCP. Patient states out of glyburide, will refill. She states she has her other medications.  Discussed anti-inflammatories (she is on mobic and ASA), rest, elevation, follow-up with PCP. Heart rate is improved to 73. No chest pain or shortness of breath. Message sent to Dr. Ree Kida.  BP 139/58 mmHg  Pulse 73  Temp(Src) 98 F (36.7 C) (Oral)  Resp 16  Ht 5\' 8"  (1.727 m)  Wt 208 lb (94.348 kg)  BMI 31.63 kg/m2  SpO2 96%    Ezequiel Essex, MD 04/11/14 1821

## 2014-04-11 NOTE — ED Notes (Signed)
Patient transported to Ultrasound 

## 2014-04-11 NOTE — ED Notes (Signed)
Pt reports RLE (outer calf) pain - intermittent that worsened last night. Denies known injury, denies being sedentary.

## 2014-04-11 NOTE — Discharge Instructions (Signed)
Muscle Pain As we discussed, stop taking your statin medication. Follow up with your PCP. Return to the ED if you develop new or worsening symptoms. Muscle pain (myalgia) may be caused by many things, including:  Overuse or muscle strain, especially if you are not in shape. This is the most common cause of muscle pain.  Injury.  Bruises.  Viruses, such as the flu.  Infectious diseases.  Fibromyalgia, which is a chronic condition that causes muscle tenderness, fatigue, and headache.  Autoimmune diseases, including lupus.  Certain drugs, including ACE inhibitors and statins. Muscle pain may be mild or severe. In most cases, the pain lasts only a short time and goes away without treatment. To diagnose the cause of your muscle pain, your health care provider will take your medical history. This means he or she will ask you when your muscle pain began and what has been happening. If you have not had muscle pain for very long, your health care provider may want to wait before doing much testing. If your muscle pain has lasted a long time, your health care provider may want to run tests right away. If your health care provider thinks your muscle pain may be caused by illness, you may need to have additional tests to rule out certain conditions.  Treatment for muscle pain depends on the cause. Home care is often enough to relieve muscle pain. Your health care provider may also prescribe anti-inflammatory medicine. HOME CARE INSTRUCTIONS Watch your condition for any changes. The following actions may help to lessen any discomfort you are feeling:  Only take over-the-counter or prescription medicines as directed by your health care provider.  Apply ice to the sore muscle:  Put ice in a plastic bag.  Place a towel between your skin and the bag.  Leave the ice on for 15-20 minutes, 3-4 times a day.  You may alternate applying hot and cold packs to the muscle as directed by your health care  provider.  If overuse is causing your muscle pain, slow down your activities until the pain goes away.  Remember that it is normal to feel some muscle pain after starting a workout program. Muscles that have not been used often will be sore at first.  Do regular, gentle exercises if you are not usually active.  Warm up before exercising to lower your risk of muscle pain.  Do not continue working out if the pain is very bad. Bad pain could mean you have injured a muscle. SEEK MEDICAL CARE IF:  Your muscle pain gets worse, and medicines do not help.  You have muscle pain that lasts longer than 3 days.  You have a rash or fever along with muscle pain.  You have muscle pain after a tick bite.  You have muscle pain while working out, even though you are in good physical condition.  You have redness, soreness, or swelling along with muscle pain.  You have muscle pain after starting a new medicine or changing the dose of a medicine. SEEK IMMEDIATE MEDICAL CARE IF:  You have trouble breathing.  You have trouble swallowing.  You have muscle pain along with a stiff neck, fever, and vomiting.  You have severe muscle weakness or cannot move part of your body. MAKE SURE YOU:   Understand these instructions.  Will watch your condition.  Will get help right away if you are not doing well or get worse. Document Released: 01/20/2006 Document Revised: 03/05/2013 Document Reviewed: 12/25/2012 [employer] Patient Information  2015 [employer]. This information is not intended to replace advice given to you by your health care provider. Make sure you discuss any questions you have with your health care provider.

## 2014-04-11 NOTE — ED Notes (Signed)
MD at bedside. 

## 2014-04-11 NOTE — ED Notes (Signed)
Reports she has restless legs, had it last night, took medication (RLS) and no relief. Denies SHOB, chest pain or recent travel.

## 2014-04-30 NOTE — Addendum Note (Signed)
Addended by: Lorenza Cambridge on: 04/30/2014 10:41 AM   Modules accepted: Level of Service

## 2014-05-01 NOTE — Addendum Note (Signed)
Addended by: Lupita Dawn on: 05/01/2014 10:21 AM   Modules accepted: Level of Service

## 2014-05-08 DIAGNOSIS — M25562 Pain in left knee: Secondary | ICD-10-CM | POA: Diagnosis not present

## 2014-05-08 DIAGNOSIS — M25561 Pain in right knee: Secondary | ICD-10-CM | POA: Diagnosis not present

## 2014-07-02 DIAGNOSIS — H40003 Preglaucoma, unspecified, bilateral: Secondary | ICD-10-CM | POA: Diagnosis not present

## 2014-07-02 DIAGNOSIS — E119 Type 2 diabetes mellitus without complications: Secondary | ICD-10-CM | POA: Diagnosis not present

## 2014-07-02 DIAGNOSIS — H2513 Age-related nuclear cataract, bilateral: Secondary | ICD-10-CM | POA: Diagnosis not present

## 2014-07-09 ENCOUNTER — Other Ambulatory Visit: Payer: Self-pay | Admitting: *Deleted

## 2014-07-09 MED ORDER — GLYBURIDE 5 MG PO TABS: 2.5000 mg | ORAL_TABLET | Freq: Every day | ORAL | Status: DC

## 2014-07-28 ENCOUNTER — Other Ambulatory Visit: Payer: Self-pay | Admitting: Family Medicine

## 2014-08-05 ENCOUNTER — Other Ambulatory Visit: Payer: Self-pay | Admitting: *Deleted

## 2014-08-05 NOTE — Telephone Encounter (Signed)
Pt was in the office with her mom and she said she needed a refill on her meds. Tenia Goh Kennon Holter, CMA

## 2014-09-09 DIAGNOSIS — H40003 Preglaucoma, unspecified, bilateral: Secondary | ICD-10-CM | POA: Diagnosis not present

## 2014-09-18 ENCOUNTER — Other Ambulatory Visit: Payer: Self-pay | Admitting: Family Medicine

## 2014-09-18 MED ORDER — METFORMIN HCL 1000 MG PO TABS: 1000.0000 mg | ORAL_TABLET | Freq: Two times a day (BID) | ORAL | Status: DC

## 2014-09-18 NOTE — Telephone Encounter (Signed)
Pt called and would like a refill on her Metformin called in. She is out. Tiffany Becker

## 2014-10-03 ENCOUNTER — Telehealth: Payer: Self-pay | Admitting: *Deleted

## 2014-10-03 NOTE — Telephone Encounter (Signed)
Pt calls and wants Dr. Ree Becker to know that her BPs having been running 147/64 , 151/60 , 149/70 and she is having "terrible headaches from the time I wake up till I go to bed".  She is wondering if she needs to change her BP meds or if he has a suggestion on what she should take for her headaches "that will not make my BP higher".  She has to take her mother to an appt, but she gives permission to call and speak to her husband, Tiffany Becker. Tiffany Becker, Tiffany Becker, CMA

## 2014-10-03 NOTE — Telephone Encounter (Signed)
Patient returned call. Reports bilateral frontal headache for past week, no associated vision changes/hearing changes/vertigo/light sensitivity/or sound sensitivity. BP's well controlled last week and only elevated with headache. History of migraines however this pain is different. Took a few aspirin with minimal relief of symptoms. Has not been taking prescribed Mobic. Counseled patient to take dose of Mobic today and apply cold compress to forehead. Informed her that I think elevated BP likely is related to pain and not the cause of headache (as only mildly elevated). Return/ED precautions discussed including neurologic symptoms or worsening pain.

## 2014-10-03 NOTE — Telephone Encounter (Signed)
Attempted to call patient, not available, left message on voicemail - told patient to go to urgent care if symptoms persist

## 2014-10-20 ENCOUNTER — Ambulatory Visit (INDEPENDENT_AMBULATORY_CARE_PROVIDER_SITE_OTHER): Payer: Medicare Other | Admitting: Family Medicine

## 2014-10-20 ENCOUNTER — Encounter: Payer: Self-pay | Admitting: Family Medicine

## 2014-10-20 VITALS — BP 152/57 | HR 67 | Temp 98.1°F | Ht 68.0 in | Wt 201.0 lb

## 2014-10-20 DIAGNOSIS — Z658 Other specified problems related to psychosocial circumstances: Secondary | ICD-10-CM | POA: Diagnosis not present

## 2014-10-20 DIAGNOSIS — R634 Abnormal weight loss: Secondary | ICD-10-CM

## 2014-10-20 DIAGNOSIS — I10 Essential (primary) hypertension: Secondary | ICD-10-CM | POA: Diagnosis not present

## 2014-10-20 DIAGNOSIS — F439 Reaction to severe stress, unspecified: Secondary | ICD-10-CM | POA: Insufficient documentation

## 2014-10-20 DIAGNOSIS — E2839 Other primary ovarian failure: Secondary | ICD-10-CM | POA: Diagnosis not present

## 2014-10-20 DIAGNOSIS — E785 Hyperlipidemia, unspecified: Secondary | ICD-10-CM | POA: Diagnosis not present

## 2014-10-20 DIAGNOSIS — E119 Type 2 diabetes mellitus without complications: Secondary | ICD-10-CM

## 2014-10-20 LAB — LIPID PANEL
Cholesterol: 186 mg/dL (ref 125–200)
HDL: 62 mg/dL (ref >=46)
LDL CALC: 99 mg/dL (ref <130)
Total CHOL/HDL Ratio: 3 Ratio (ref <=5.0)
Triglycerides: 127 mg/dL (ref <150)
VLDL: 25 mg/dL (ref <30)

## 2014-10-20 LAB — BASIC METABOLIC PANEL
BUN: 13 mg/dL (ref 7–25)
CO2: 25 mmol/L (ref 20–31)
Calcium: 9.5 mg/dL (ref 8.6–10.4)
Chloride: 99 mmol/L (ref 98–110)
Creat: 0.83 mg/dL (ref 0.50–0.99)
Glucose, Bld: 106 mg/dL — ABNORMAL HIGH (ref 65–99)
Potassium: 4.7 mmol/L (ref 3.5–5.3)
Sodium: 136 mmol/L (ref 135–146)

## 2014-10-20 LAB — POCT GLYCOSYLATED HEMOGLOBIN (HGB A1C): Hemoglobin A1C: 6.3

## 2014-10-20 MED ORDER — LISINOPRIL 40 MG PO TABS: 40.0000 mg | ORAL_TABLET | Freq: Every day | ORAL | Status: DC

## 2014-10-20 NOTE — Assessment & Plan Note (Signed)
Patient has history of hyperlipidemia. Taking Pravachol 3 times weekly due to history of muscle aches with daily dosing. Had previous adverse reaction to Lipitor. -Continue Pravachol 3 times weekly -Check lipid profile

## 2014-10-20 NOTE — Assessment & Plan Note (Signed)
Patient due for DEXA. -Order placed in [place]

## 2014-10-20 NOTE — Progress Notes (Signed)
   Subjective:    Patient ID: JAIRA CANADY, female    DOB: 1946-01-24, 69 y.o.   MRN: [id number]  HPI 69 year old female presents for routine follow-up  Type 2 diabetes-patient due for recheck of hemoglobin A1c, currently on glyburide 2.5 mg by mouth daily, does not check blood sugars, no hypoglycemic episodes, recently was seen by ophthalmology and was found to have a left cataract but no evidence of retinopathy  Hypertension-patient reports increased blood pressures at home, blood pressure elevated today, no chest pain, no dizziness, no headache, reports compliance with lisinopril 30 mg daily, denies side effects  Hyperlipidemia-takes Pravachol 40 mg 3 days a week, reports intermittent aches and pains in her bilateral lower extremities that are slightly improved since decreasing Pravachol from once daily to 3 days a week  Stress/anxiety - 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  Respiratory: Negative for cough and shortness of breath.   Cardiovascular: Negative for chest pain and leg swelling.  Gastrointestinal: Negative for nausea and diarrhea.       Objective:   Physical Exam Vitals: Reviewed Gen.: Pleasant Caucasian female, no acute distress HEENT: Normocephalic, pupils equal round and reactive to light, extra amounts are intact, no scleral icterus, moist mucous members, neck was supple, no anterior posterior cervical lymphadenopathy Cardiac: Regular in rhythm, S1 and S2 present, no murmurs, no heaves or thrills Respiratory: Clear to patient bilaterally, normal effort Extremities: No edema Vascular:  2+ radial and dorsalis pedis pulses bilaterally Skin: Please see quality metrics for diabetic foot examination     Assessment & Plan:  Please see problem specific assessment and plan.

## 2014-10-20 NOTE — Assessment & Plan Note (Signed)
Weight down 7 pounds since January 2016. Patient reports mild decreased appetite without red flag symptoms including fevers, chills, night sweats. 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  Continue to monitor weight closely

## 2014-10-20 NOTE — Assessment & Plan Note (Signed)
Blood pressure elevated. -Check BMP -Increase lisinopril to 40 mg daily -Recheck BMP in one week -Continue to monitor home blood pressures and follow-up in one month

## 2014-10-20 NOTE — Assessment & Plan Note (Signed)
She reports increased stress in her life related to caring for her elderly mother as well as her husband who is sick with CHF. Patient is not currently depressed. Not interested in pharmacotherapy at this time. Discussed patient continued to participate in activities that bring her pleasure including church.

## 2014-10-20 NOTE — Patient Instructions (Addendum)
It was nice to see you today.  Blood pressure - your blood pressure is high today, increase Lisinopril 40 mg daily  Stress - please make time for yourself, please let me know if this worsens  Cholesterol - continue pravastatin 3 days a weeks, check cholesterol today  Diabetes - A1C 6.3 today (well controlled)  Please come back to see me in one month to recheck blood pressure.  Please return in one week to recheck blood work.

## 2014-10-20 NOTE — Assessment & Plan Note (Signed)
Well-controlled on daily glyburide. Foot exam completed today. Up-to-date on diabetic eye exam. -Continue with her current therapy

## 2014-10-21 ENCOUNTER — Telehealth: Payer: Self-pay | Admitting: Family Medicine

## 2014-10-21 NOTE — Telephone Encounter (Signed)
Discussed BMP and Lipid Profile results. No change in therapy.

## 2014-10-23 ENCOUNTER — Other Ambulatory Visit: Payer: Medicare Other

## 2014-10-28 ENCOUNTER — Other Ambulatory Visit: Payer: Medicare Other

## 2014-10-28 ENCOUNTER — Ambulatory Visit (INDEPENDENT_AMBULATORY_CARE_PROVIDER_SITE_OTHER): Payer: Medicare Other | Admitting: *Deleted

## 2014-10-28 DIAGNOSIS — I10 Essential (primary) hypertension: Secondary | ICD-10-CM

## 2014-10-28 DIAGNOSIS — Z23 Encounter for immunization: Secondary | ICD-10-CM

## 2014-10-28 LAB — BASIC METABOLIC PANEL
BUN: 17 mg/dL (ref 7–25)
CALCIUM: 9.7 mg/dL (ref 8.6–10.4)
CHLORIDE: 97 mmol/L — AB (ref 98–110)
CO2: 28 mmol/L (ref 20–31)
CREATININE: 0.85 mg/dL (ref 0.50–0.99)
GLUCOSE: 115 mg/dL — AB (ref 65–99)
Potassium: 4.7 mmol/L (ref 3.5–5.3)
Sodium: 137 mmol/L (ref 135–146)

## 2014-10-28 NOTE — Progress Notes (Signed)
Bmp done today [place] 

## 2014-10-29 ENCOUNTER — Ambulatory Visit
Admission: RE | Admit: 2014-10-29 | Discharge: 2014-10-29 | Disposition: A | Discharge status: Home / Self Care | Payer: Medicare Other | Source: Ambulatory Visit | Attending: Family Medicine | Admitting: Family Medicine

## 2014-10-29 ENCOUNTER — Telehealth: Payer: Self-pay | Admitting: Family Medicine

## 2014-10-29 DIAGNOSIS — M8580 Other specified disorders of bone density and structure, unspecified site: Secondary | ICD-10-CM

## 2014-10-29 DIAGNOSIS — E2839 Other primary ovarian failure: Secondary | ICD-10-CM

## 2014-10-29 DIAGNOSIS — M85852 Other specified disorders of bone density and structure, left thigh: Secondary | ICD-10-CM | POA: Diagnosis not present

## 2014-10-29 DIAGNOSIS — Z780 Asymptomatic menopausal state: Secondary | ICD-10-CM | POA: Diagnosis not present

## 2014-10-29 NOTE — Telephone Encounter (Signed)
Called patient to discuss normal BMP (glucose slightly elevated as expected with diabetes), home BP's running 120's/60's, continue current therapy.

## 2014-10-30 ENCOUNTER — Telehealth: Payer: Self-pay | Admitting: Family Medicine

## 2014-10-30 DIAGNOSIS — M8580 Other specified disorders of bone density and structure, unspecified site: Secondary | ICD-10-CM | POA: Insufficient documentation

## 2014-10-30 NOTE — Telephone Encounter (Signed)
Discussed BMD results, osteopenia present, no taking Ca or Vit D, counseled patient to take Ca 1200 mg daily and Vit D 1000 IU daily

## 2014-10-30 NOTE — Assessment & Plan Note (Signed)
DEXA showed osteopenia -start Ca 1200 mg daily and Vit D 1000 IU daily -repeat DEXA in 2 years

## 2014-10-31 DIAGNOSIS — Z1231 Encounter for screening mammogram for malignant neoplasm of breast: Secondary | ICD-10-CM | POA: Diagnosis not present

## 2014-11-21 ENCOUNTER — Encounter: Payer: Self-pay | Admitting: Family Medicine

## 2014-11-21 ENCOUNTER — Ambulatory Visit (INDEPENDENT_AMBULATORY_CARE_PROVIDER_SITE_OTHER): Payer: Medicare Other | Admitting: Family Medicine

## 2014-11-21 VITALS — BP 151/64 | HR 66 | Temp 98.3°F | Wt 204.3 lb

## 2014-11-21 DIAGNOSIS — R0789 Other chest pain: Secondary | ICD-10-CM | POA: Diagnosis not present

## 2014-11-21 DIAGNOSIS — Z23 Encounter for immunization: Secondary | ICD-10-CM | POA: Diagnosis present

## 2014-11-21 DIAGNOSIS — I10 Essential (primary) hypertension: Secondary | ICD-10-CM | POA: Diagnosis not present

## 2014-11-21 DIAGNOSIS — R079 Chest pain, unspecified: Secondary | ICD-10-CM | POA: Insufficient documentation

## 2014-11-21 NOTE — Progress Notes (Signed)
   Subjective:    Patient ID: KALYANI MAEDA, female    DOB: [birth year], 69 y.o.   MRN: [id number]  HPI 69 year old female presents for follow-up of hypertension.  Hypertension - at last visit lisinopril was increased to 40 mg daily, home blood pressures have been ranging from 110's -150's/40-50's, no orthostasis, no dizziness, no headaches, no vision changes, she reports very infrequent (less than once per month) history of sharp substernal chest pain that lasts 1 second, no relation to exertion, she reports that she is able to work in the garden without chest pain, no shortness of breath, no nausea, no diaphoresis, no PND, no orthopnea, no leg swelling, no calf tenderness.   Review of Systems Per history of present illness    Objective:   Physical Exam Vitals: Reviewed Gen.: Pleasant female, no acute distress Cardiac: Regular rate and rhythm, S1 and S2 present, no murmurs, no heaves or thrills Respiratory: Clear to patient bilaterally, normal effort Extremities: No edema  Reviewed recent lab work.     Assessment & Plan:  Please see problem specific assessment and plan.  Flu shot provided today.

## 2014-11-21 NOTE — Patient Instructions (Signed)
It was nice to see you today.  Continue Lisinopril 40 mg daily. Please continue to check your blood pressure at home.  You will be given a flu shot today.

## 2014-11-21 NOTE — Assessment & Plan Note (Signed)
Controlled per home readings however mildly elevated today. -No change in therapy, continue lisinopril 40 mg daily -Patient to continue to check home BPs at home and return precautions given

## 2014-11-21 NOTE — Assessment & Plan Note (Signed)
Patient reports infrequent sharp chest pain that last less than a second. No relation to exertion. Atypical in nature. No other cardiac signs or symptoms. Patient reports normal stress test approximately 10-15 years ago. -Patient declined EKG today -Clinically do not suspect ACS, return precautions given.

## 2014-12-17 ENCOUNTER — Encounter: Payer: Self-pay | Admitting: Family Medicine

## 2015-01-03 ENCOUNTER — Other Ambulatory Visit: Payer: Self-pay | Admitting: Family Medicine

## 2015-03-03 ENCOUNTER — Ambulatory Visit (INDEPENDENT_AMBULATORY_CARE_PROVIDER_SITE_OTHER): Payer: Medicare Other | Admitting: Family Medicine

## 2015-03-03 ENCOUNTER — Encounter: Payer: Self-pay | Admitting: Family Medicine

## 2015-03-03 VITALS — BP 136/73 | HR 79 | Temp 98.1°F | Ht 68.0 in | Wt 200.0 lb

## 2015-03-03 DIAGNOSIS — M25511 Pain in right shoulder: Secondary | ICD-10-CM

## 2015-03-03 DIAGNOSIS — M25512 Pain in left shoulder: Secondary | ICD-10-CM

## 2015-03-03 DIAGNOSIS — M5030 Other cervical disc degeneration, unspecified cervical region: Secondary | ICD-10-CM

## 2015-03-03 MED ORDER — PRAVASTATIN SODIUM 40 MG PO TABS: 40.0000 mg | ORAL_TABLET | Freq: Every day | ORAL | Status: DC

## 2015-03-03 MED ORDER — CELECOXIB 100 MG PO CAPS: 100.0000 mg | ORAL_CAPSULE | Freq: Every day | ORAL | Status: DC

## 2015-03-03 NOTE — Patient Instructions (Signed)
It was nice to see you today.  Please call Dr. Ree Kida if your shoulder pain does not improve.  Dr. Ree Kida will call you with your xray results.

## 2015-03-03 NOTE — Progress Notes (Signed)
**Note De-Identified Tiffany Obfuscation**    Subjective:    Patient ID: Tiffany Becker, female    DOB: 24-Jan-1946, 69 y.o.   MRN: [id number]  HPI 69 y/o female presents for evaluation of bilateral shoulder pain and neck pain.  Neck Pain- history of cervical DDD, s/p C6-C7 discetomy in 2008, pain is manageable with prn Celebrex (1-2 per week), no improvement with daily Mobic, no hand numbness or weakness  Bilateral shoulder pain (right > left) - present for a few months, worse with overhead movements, lateral shoulder pain that radiates down the arm   Review of Systems  Constitutional: Negative for fever, chills and fatigue.  Respiratory: Negative for cough.   Gastrointestinal: Negative for nausea, vomiting and diarrhea.       Objective:   Physical Exam  Vitals: Reviewed Gen.: Pleasant female, no acute distress MSK: Cervical neck - no midline or para cervical spinal tenderness, range of motion is limited in multiple directions; right shoulder - generalized tenderness, forward flexion limited to 145, able to actively forward flex the arm to 170, abduction limited to 140, able to actively abduction the arm to 170; positive Hawkins; positive Neer's; rotator cuff testing including empty can/external rotation/lift off test all within normal limits; internal and external rotation range of motion is full; left shoulder - minimal generalized tenderness over the deltoid, positive Hawkins, negative Neer's, Rotator cuff testing including empty can/lift off/resisted external rotation within normal limits, internal and external rotation range of motion is full  Neuro: Grip strength 5/5 bilateral hands, sensation to light-touch grossly intact  Procedure note: Bilateral subacromial steroid injection of bilateral shoulder. Written and verbal consent were obtained. The risk and benefits of the procedure were discussed. The area was prepped in sterile fashion. Using a 5 mL syringe and 1/2 inch 25-gauge needle 20 mg of Depo-Medrol and 3 mL of  lidocaine without epinephrine were injected into bilateral subacromial space. Patient tolerated the procedure well. No complications. No bleeding. Band-Aid applied.     Assessment & Plan:  Bilateral shoulder pain Clinically consistent with subacromial bursitis/OA. Will check xray evaluate the bony structure of bilateral shoulder. -bilateral shoulder xray -steroid injection of bilateral shoulder performed per procedure note -continue prn Celebrex (discussed possible cardiac side effects).   DDD (degenerative disc disease), cervical Patient has intermittent cervical neck pain without current radicular symptoms.  -continue Celebrex prn

## 2015-03-03 NOTE — Assessment & Plan Note (Signed)
Patient has intermittent cervical neck pain without current radicular symptoms.  -continue Celebrex prn

## 2015-03-03 NOTE — Assessment & Plan Note (Signed)
Clinically consistent with subacromial bursitis/OA. Will check xray evaluate the bony structure of bilateral shoulder. -bilateral shoulder xray -steroid injection of bilateral shoulder performed per procedure note -continue prn Celebrex (discussed possible cardiac side effects).

## 2015-03-16 ENCOUNTER — Other Ambulatory Visit: Payer: Self-pay | Admitting: Family Medicine

## 2015-04-08 IMAGING — US US EXTREM LOW VENOUS*R*
1 series · 13 of 24 positions shown · non-contrast
Comparison: None.

CLINICAL DATA: Right lower lateral calf pain for 6 months, worse
today after prolonged standing yesterday.



[Series 1: us extrem low venous*right* · 0.08mm/px · 13 of 49 slices shown]
[im 1/49]
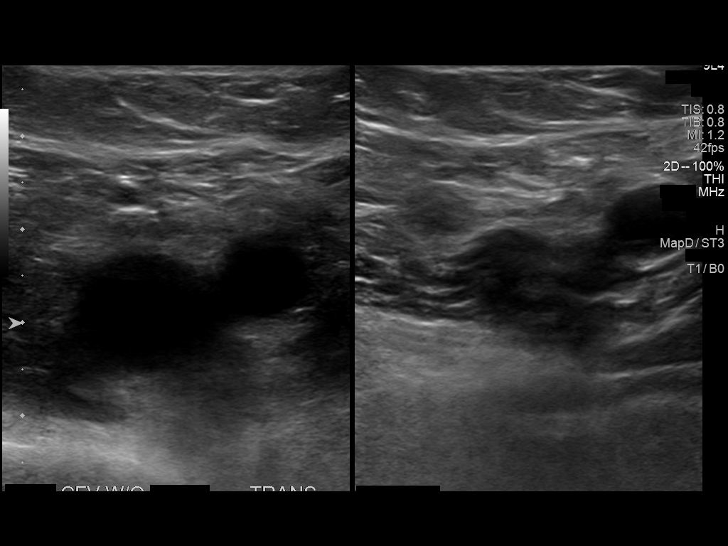
[im 5/49]
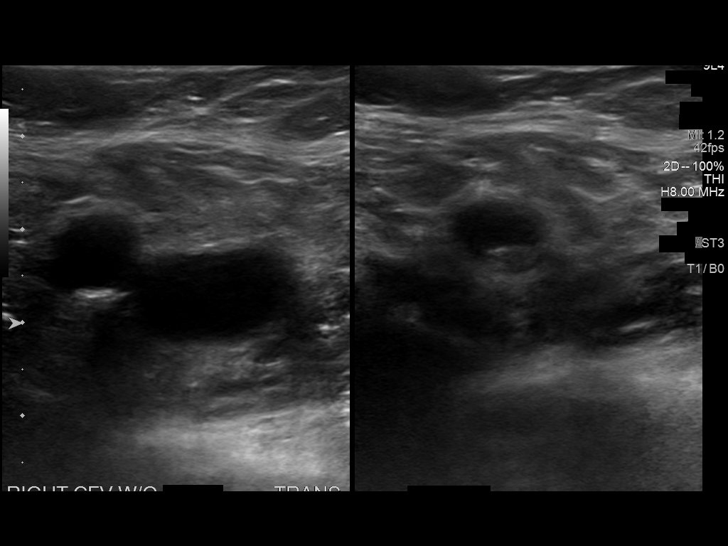
[im 9/49]
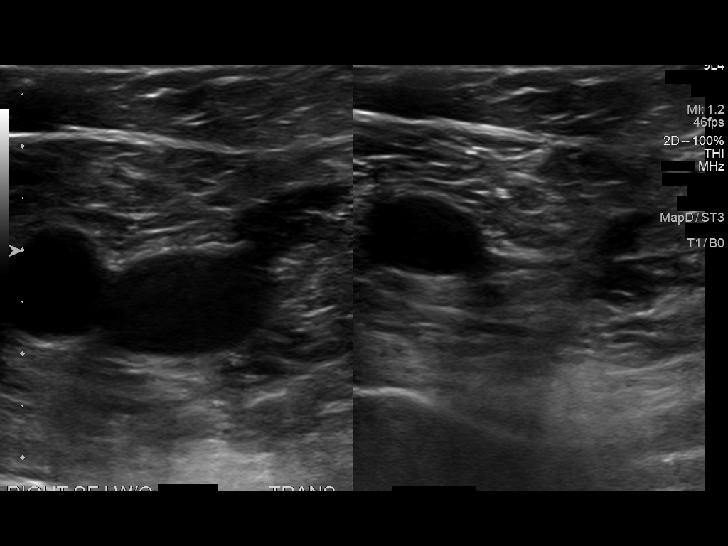
[im 13/49]
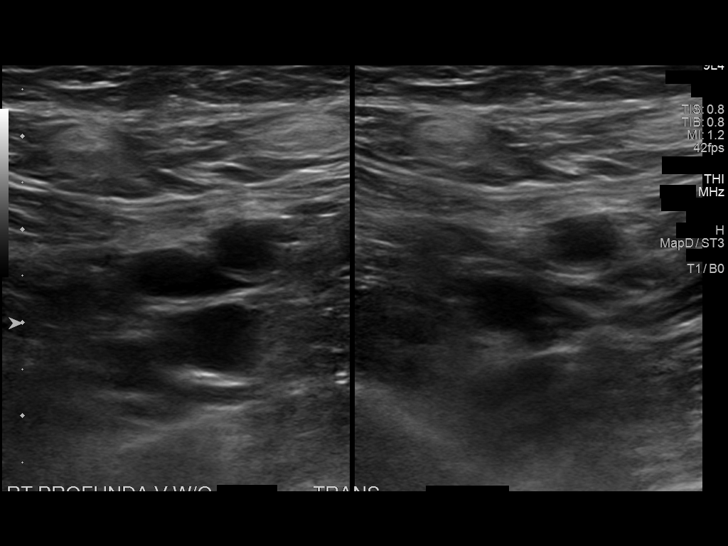
[im 17/49]
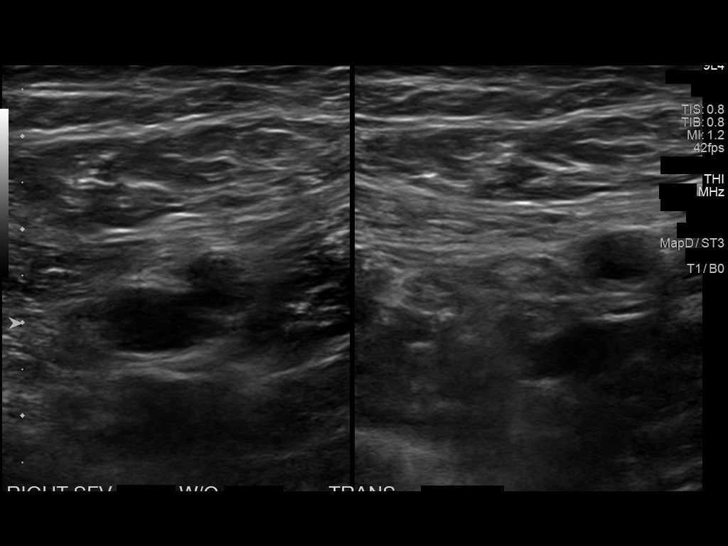
[im 21/49]
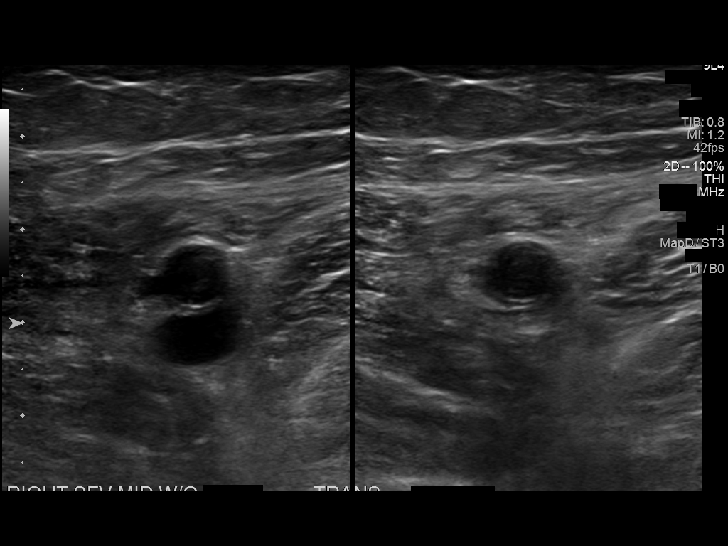
[im 26/49]
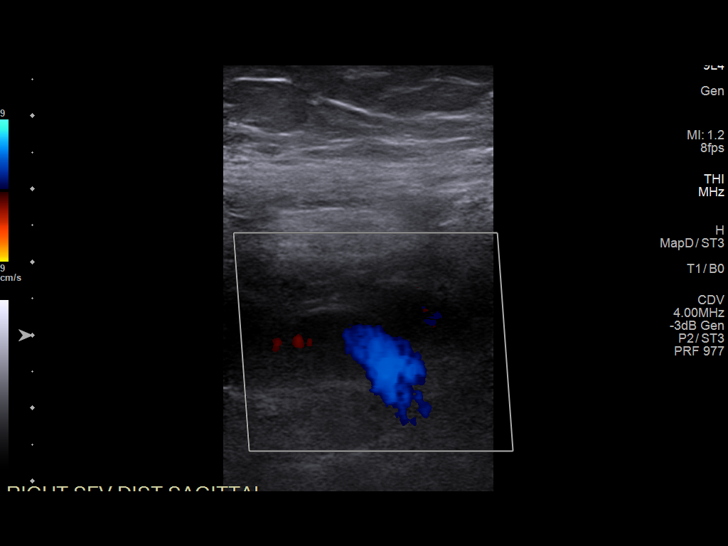
[im 28/49]
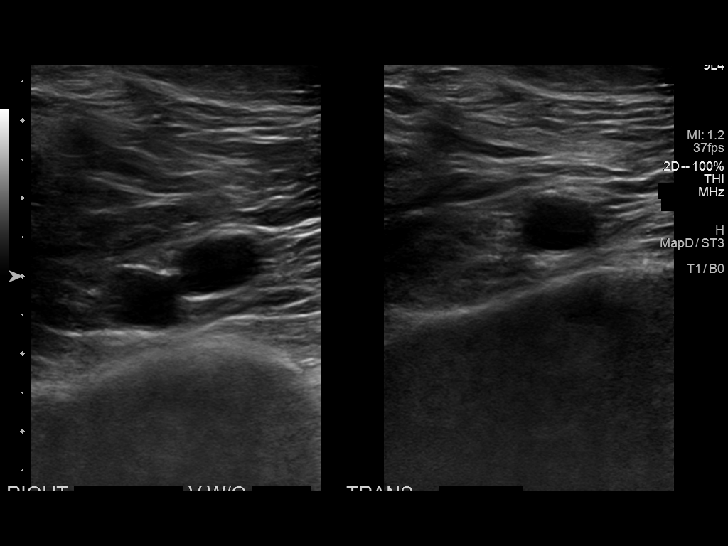
[im 32/49]
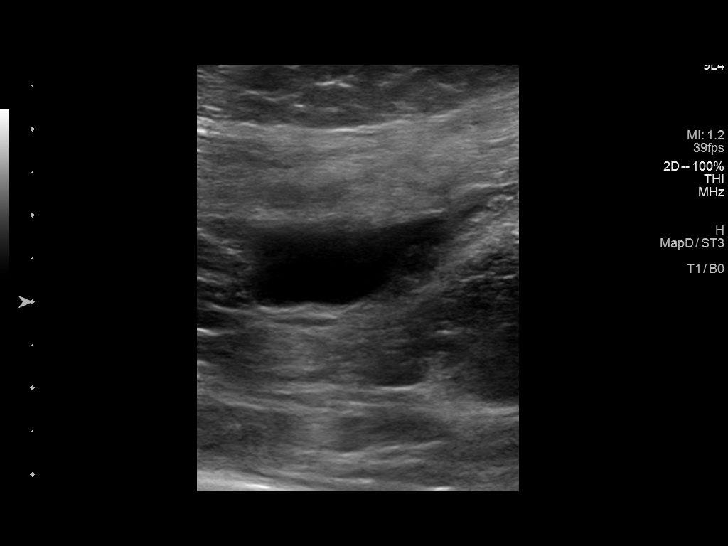
[im 36/49]
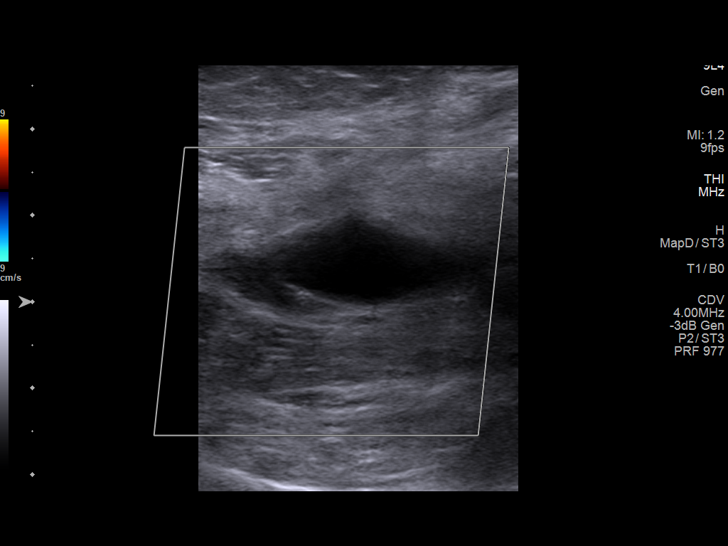
[im 40/49]
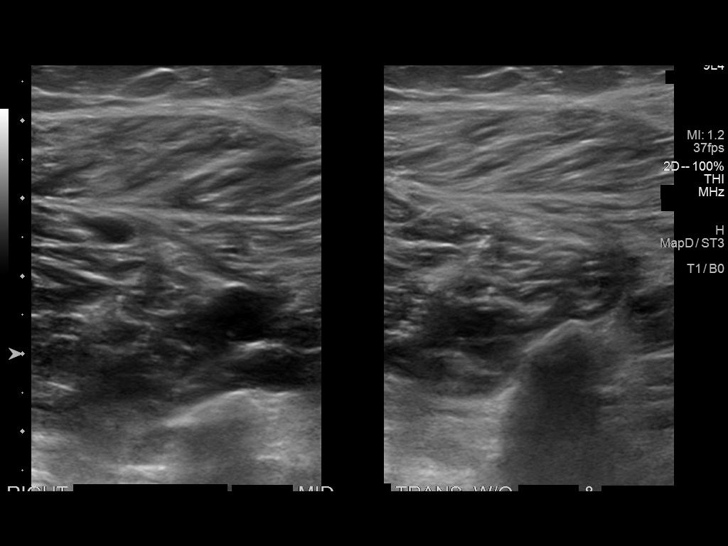
[im 44/49]
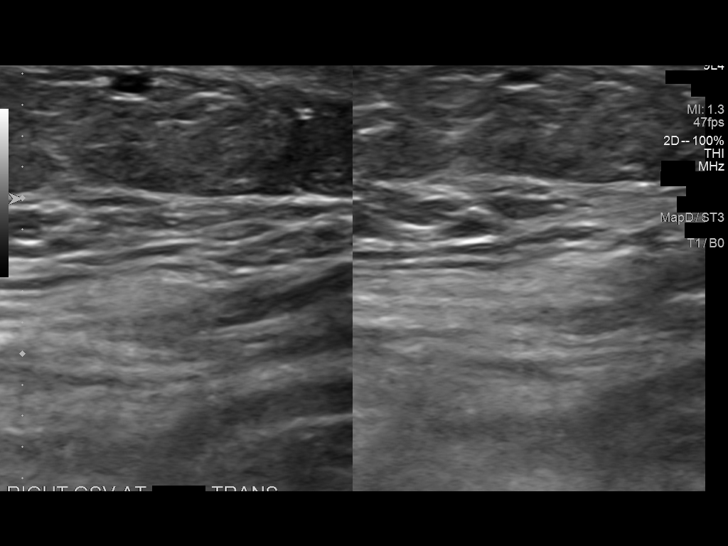
[im 49/49]
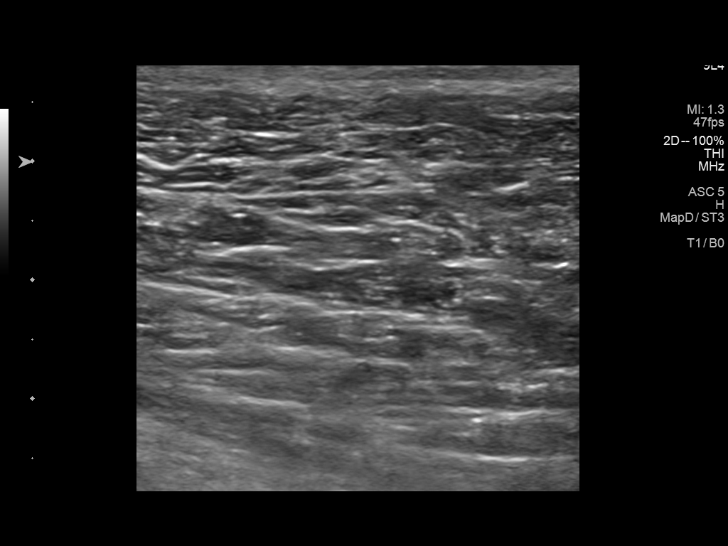

[13 of 24 positions shown; findings below may reference images not displayed]

FINDINGS: Contralateral Common Femoral Vein: Respiratory phasicity is normal
and symmetric with the symptomatic side. No evidence of thrombus.
Normal compressibility.

Common Femoral Vein: No evidence of thrombus. Normal
compressibility, respiratory phasicity and response to augmentation.

Saphenofemoral Junction: No evidence of thrombus. Normal
compressibility and flow on color Doppler imaging.

Profunda Femoral Vein: No evidence of thrombus. Normal
compressibility and flow on color Doppler imaging.

Femoral Vein: No evidence of thrombus. Normal compressibility,
respiratory phasicity and response to augmentation.

Popliteal Vein: No evidence of thrombus. Normal compressibility,
respiratory phasicity and response to augmentation.

Calf Veins: No evidence of thrombus. Normal compressibility and flow
on color Doppler imaging.

Superficial Great Saphenous Vein: No evidence of thrombus. Normal
compressibility and flow on color Doppler imaging.

Venous Reflux:  None.

Other Findings: There is a 1 x 2.5 x 2.5 cm cyst in the medial right
popliteal fossa.

Directed sonographic evaluation of the area of pain in the lateral
right calf demonstrates only normal appearing tissues.
IMPRESSION: No evidence of deep venous thrombosis.

2.5 cm popliteal cyst.

No sonographic abnormality in the area of concern, lateral right
calf.

## 2015-04-14 ENCOUNTER — Ambulatory Visit (HOSPITAL_COMMUNITY)
Admission: RE | Admit: 2015-04-14 | Discharge: 2015-04-14 | Disposition: A | Discharge status: Home / Self Care | Payer: Medicare Other | Source: Ambulatory Visit | Attending: Family Medicine | Admitting: Family Medicine

## 2015-04-14 DIAGNOSIS — M19011 Primary osteoarthritis, right shoulder: Secondary | ICD-10-CM | POA: Diagnosis not present

## 2015-04-14 DIAGNOSIS — M25519 Pain in unspecified shoulder: Secondary | ICD-10-CM | POA: Diagnosis present

## 2015-04-14 DIAGNOSIS — M25511 Pain in right shoulder: Secondary | ICD-10-CM | POA: Insufficient documentation

## 2015-04-14 DIAGNOSIS — M19012 Primary osteoarthritis, left shoulder: Secondary | ICD-10-CM | POA: Diagnosis not present

## 2015-04-14 DIAGNOSIS — M25512 Pain in left shoulder: Secondary | ICD-10-CM | POA: Insufficient documentation

## 2015-04-15 ENCOUNTER — Telehealth: Payer: Self-pay | Admitting: Family Medicine

## 2015-04-15 MED ORDER — CELECOXIB 100 MG PO CAPS: 100.0000 mg | ORAL_CAPSULE | Freq: Every day | ORAL | Status: DC

## 2015-04-15 NOTE — Telephone Encounter (Signed)
Discussed bilateral shoulder xray which showed moderate OA bilaterally. Overall patient is improved with steroid injection. Requested refill of Celebrex which was sent to pharmacy.

## 2015-04-17 ENCOUNTER — Telehealth: Payer: Self-pay | Admitting: *Deleted

## 2015-04-17 NOTE — Telephone Encounter (Signed)
PA for Celecoxib faxed to [facility] for review.  Review process could take 24-72 hours to complete.  Derl Barrow, RN

## 2015-04-17 NOTE — Telephone Encounter (Signed)
Prior Authorization received from [place] for Celecoxib 100 mg capsule.  PA form placed in provider box for completion. Derl Barrow, RN

## 2015-04-20 NOTE — Telephone Encounter (Signed)
PA was denied due to more information was not faxed back by due date of 04/19/15.  Appeal form completed explaining that [facility] is closed on weekends and also list the medications patient has tried and failed.  Forms faxed to [facility].  Derl Barrow, RN

## 2015-04-23 NOTE — Telephone Encounter (Signed)
Tabitha calling from [facility] regarding appeal. Celecoxib has been approved for 100mg  "60 for 60" from 04/23/2015 - 03/13/2016 Sadie Reynolds, ASA

## 2015-04-23 NOTE — Telephone Encounter (Signed)
Documentation of PA approval seen and noted.

## 2015-05-04 ENCOUNTER — Other Ambulatory Visit: Payer: Self-pay | Admitting: Family Medicine

## 2015-07-04 ENCOUNTER — Other Ambulatory Visit: Payer: Self-pay | Admitting: Family Medicine

## 2015-07-06 DIAGNOSIS — H40003 Preglaucoma, unspecified, bilateral: Secondary | ICD-10-CM | POA: Diagnosis not present

## 2015-07-06 DIAGNOSIS — H40013 Open angle with borderline findings, low risk, bilateral: Secondary | ICD-10-CM | POA: Diagnosis not present

## 2015-07-06 DIAGNOSIS — E119 Type 2 diabetes mellitus without complications: Secondary | ICD-10-CM | POA: Diagnosis not present

## 2015-07-06 DIAGNOSIS — H2513 Age-related nuclear cataract, bilateral: Secondary | ICD-10-CM | POA: Diagnosis not present

## 2015-07-27 ENCOUNTER — Other Ambulatory Visit: Payer: Self-pay | Admitting: Family Medicine

## 2015-08-17 ENCOUNTER — Encounter: Payer: Self-pay | Admitting: Family Medicine

## 2015-08-17 ENCOUNTER — Ambulatory Visit (INDEPENDENT_AMBULATORY_CARE_PROVIDER_SITE_OTHER): Payer: Medicare Other | Admitting: Family Medicine

## 2015-08-17 VITALS — BP 154/63 | HR 69 | Temp 97.9°F | Wt 205.0 lb

## 2015-08-17 DIAGNOSIS — T148 Other injury of unspecified body region: Secondary | ICD-10-CM

## 2015-08-17 DIAGNOSIS — W57XXXA Bitten or stung by nonvenomous insect and other nonvenomous arthropods, initial encounter: Secondary | ICD-10-CM | POA: Diagnosis not present

## 2015-08-17 DIAGNOSIS — L089 Local infection of the skin and subcutaneous tissue, unspecified: Secondary | ICD-10-CM

## 2015-08-17 MED ORDER — CEPHALEXIN 500 MG PO CAPS: 500.0000 mg | ORAL_CAPSULE | Freq: Four times a day (QID) | ORAL | Status: AC

## 2015-08-17 NOTE — Patient Instructions (Signed)
Skin infection: take keflex 4 times a day for 7 days  Return if worsening, redness spreading after 2 days of the antibiotic.

## 2015-08-17 NOTE — Progress Notes (Signed)
   Subjective:    Patient ID: Tiffany Becker, female    DOB: 1945/12/07, 70 y.o.   MRN: [id number]  HPI  Patient presents for Same Day Appointment  CC: bug bite  # Bug bite:  Right side  First noted Saturday evening  Was working outside on [place] but didn't feel a bite  Feels like it has been oozing, it has been painful  Using neosporin ROS: no fevers, but does feel a little funny  Social Hx: never smoker  Review of Systems   See HPI for ROS.   Past medical history, surgical, family, and social history reviewed and updated in the EMR as appropriate.  Objective:  BP 154/63 mmHg  Pulse 69  Temp(Src) 97.9 F (36.6 C) (Oral)  Wt 205 lb (92.987 kg) Vitals and nursing note reviewed  General: no apparent distress  Skin: right buttocks there is an area of erythema with warmth approx 6cm x 8cm with a central block dot 10mm, area is tender, no fluctuance  Assessment & Plan:  1. Bug bite with infection Bug bite with cellulitis resulting. Non-pustular, will treat with keflex x 7 days, given return precautions for worsening or developing systemic symptoms.  BP also elevated, discuss returning to see PCP if home measurements continue to be above 140/90  Return if symptoms worsen or fail to improve.

## 2015-09-14 ENCOUNTER — Other Ambulatory Visit: Payer: Self-pay | Admitting: Family Medicine

## 2015-10-12 DIAGNOSIS — M79644 Pain in right finger(s): Secondary | ICD-10-CM | POA: Diagnosis not present

## 2015-10-12 DIAGNOSIS — M65311 Trigger thumb, right thumb: Secondary | ICD-10-CM | POA: Diagnosis not present

## 2015-10-12 DIAGNOSIS — M180 Bilateral primary osteoarthritis of first carpometacarpal joints: Secondary | ICD-10-CM | POA: Diagnosis not present

## 2015-10-27 ENCOUNTER — Encounter: Payer: Self-pay | Admitting: Family Medicine

## 2015-10-27 ENCOUNTER — Ambulatory Visit (INDEPENDENT_AMBULATORY_CARE_PROVIDER_SITE_OTHER): Payer: Medicare Other | Admitting: *Deleted

## 2015-10-27 ENCOUNTER — Encounter: Payer: Self-pay | Admitting: *Deleted

## 2015-10-27 VITALS — BP 150/72 | HR 60 | Temp 98.1°F | Ht 69.0 in | Wt 210.2 lb

## 2015-10-27 DIAGNOSIS — Z0000 Encounter for general adult medical examination without abnormal findings: Secondary | ICD-10-CM

## 2015-10-27 DIAGNOSIS — E1165 Type 2 diabetes mellitus with hyperglycemia: Secondary | ICD-10-CM | POA: Diagnosis not present

## 2015-10-27 DIAGNOSIS — Z1159 Encounter for screening for other viral diseases: Secondary | ICD-10-CM | POA: Diagnosis not present

## 2015-10-27 LAB — POCT GLYCOSYLATED HEMOGLOBIN (HGB A1C): Hemoglobin A1C: 6.6

## 2015-10-27 NOTE — Patient Instructions (Signed)
Health Maintenance, Female Adopting a healthy lifestyle and getting preventive care can go a long way to promote health and wellness. Talk with your health care provider about what schedule of regular examinations is right for you. This is a good chance for you to check in with your provider about disease prevention and staying healthy. In between checkups, there are plenty of things you can do on your own. Experts have done a lot of research about which lifestyle changes and preventive measures are most likely to keep you healthy. Ask your health care provider for more information. WEIGHT AND DIET  Eat a healthy diet  Be sure to include plenty of vegetables, fruits, low-fat dairy products, and lean protein.  Do not eat a lot of foods high in solid fats, added sugars, or salt.  Get regular exercise. This is one of the most important things you can do for your health.  Most adults should exercise for at least 150 minutes each week. The exercise should increase your heart rate and make you sweat (moderate-intensity exercise).  Most adults should also do strengthening exercises at least twice a week. This is in addition to the moderate-intensity exercise.  Maintain a healthy weight  Body mass index (BMI) is a measurement that can be used to identify possible weight problems. It estimates body fat based on height and weight. Your health care provider can help determine your BMI and help you achieve or maintain a healthy weight.  For females 20 years of age and older:   A BMI below 18.5 is considered underweight.  A BMI of 18.5 to 24.9 is normal.  A BMI of 25 to 29.9 is considered overweight.  A BMI of 30 and above is considered obese.  Watch levels of cholesterol and blood lipids  You should start having your blood tested for lipids and cholesterol at 69 years of age, then have this test every 5 years.  You may need to have your cholesterol levels checked more often if:  Your lipid  or cholesterol levels are high.  You are older than 69 years of age.  You are at high risk for heart disease.  CANCER SCREENING   Lung Cancer  Lung cancer screening is recommended for adults 55-80 years old who are at high risk for lung cancer because of a history of smoking.  A yearly low-dose CT scan of the lungs is recommended for people who:  Currently smoke.  Have quit within the past 15 years.  Have at least a 30-pack-year history of smoking. A pack year is smoking an average of one pack of cigarettes a day for 1 year.  Yearly screening should continue until it has been 15 years since you quit.  Yearly screening should stop if you develop a health problem that would prevent you from having lung cancer treatment.  Breast Cancer  Practice breast self-awareness. This means understanding how your breasts normally appear and feel.  It also means doing regular breast self-exams. Let your health care provider know about any changes, no matter how small.  If you are in your 20s or 30s, you should have a clinical breast exam (CBE) by a health care provider every 1-3 years as part of a regular health exam.  If you are 40 or older, have a CBE every year. Also consider having a breast X-ray (mammogram) every year.  If you have a family history of breast cancer, talk to your health care provider about genetic screening.  If you   are at high risk for breast cancer, talk to your health care provider about having an MRI and a mammogram every year.  Breast cancer gene (BRCA) assessment is recommended for women who have family members with BRCA-related cancers. BRCA-related cancers include:  Breast.  Ovarian.  Tubal.  Peritoneal cancers.  Results of the assessment will determine the need for genetic counseling and BRCA1 and BRCA2 testing. Cervical Cancer Your health care provider may recommend that you be screened regularly for cancer of the pelvic organs (ovaries, uterus, and  vagina). This screening involves a pelvic examination, including checking for microscopic changes to the surface of your cervix (Pap test). You may be encouraged to have this screening done every 3 years, beginning at age 21.  For women ages 30-65, health care providers may recommend pelvic exams and Pap testing every 3 years, or they may recommend the Pap and pelvic exam, combined with testing for human papilloma virus (HPV), every 5 years. Some types of HPV increase your risk of cervical cancer. Testing for HPV may also be done on women of any age with unclear Pap test results.  Other health care providers may not recommend any screening for nonpregnant women who are considered low risk for pelvic cancer and who do not have symptoms. Ask your health care provider if a screening pelvic exam is right for you.  If you have had past treatment for cervical cancer or a condition that could lead to cancer, you need Pap tests and screening for cancer for at least 20 years after your treatment. If Pap tests have been discontinued, your risk factors (such as having a new sexual partner) need to be reassessed to determine if screening should resume. Some women have medical problems that increase the chance of getting cervical cancer. In these cases, your health care provider may recommend more frequent screening and Pap tests. Colorectal Cancer  This type of cancer can be detected and often prevented.  Routine colorectal cancer screening usually begins at 69 years of age and continues through 69 years of age.  Your health care provider may recommend screening at an earlier age if you have risk factors for colon cancer.  Your health care provider may also recommend using home test kits to check for hidden blood in the stool.  A small camera at the end of a tube can be used to examine your colon directly (sigmoidoscopy or colonoscopy). This is done to check for the earliest forms of colorectal  cancer.  Routine screening usually begins at age 50.  Direct examination of the colon should be repeated every 5-10 years through 69 years of age. However, you may need to be screened more often if early forms of precancerous polyps or small growths are found. Skin Cancer  Check your skin from head to toe regularly.  Tell your health care provider about any new moles or changes in moles, especially if there is a change in a mole's shape or color.  Also tell your health care provider if you have a mole that is larger than the size of a pencil eraser.  Always use sunscreen. Apply sunscreen liberally and repeatedly throughout the day.  Protect yourself by wearing long sleeves, pants, a wide-brimmed hat, and sunglasses whenever you are outside. HEART DISEASE, DIABETES, AND HIGH BLOOD PRESSURE   High blood pressure causes heart disease and increases the risk of stroke. High blood pressure is more likely to develop in:  People who have blood pressure in the high end   of the normal range (130-139/85-89 mm Hg).  People who are overweight or obese.  People who are African American.  If you are 38-23 years of age, have your blood pressure checked every 3-5 years. If you are 61 years of age or older, have your blood pressure checked every year. You should have your blood pressure measured twice--once when you are at a hospital or clinic, and once when you are not at a hospital or clinic. Record the average of the two measurements. To check your blood pressure when you are not at a hospital or clinic, you can use:  An automated blood pressure machine at a pharmacy.  A home blood pressure monitor.  If you are between 45 years and 39 years old, ask your health care provider if you should take aspirin to prevent strokes.  Have regular diabetes screenings. This involves taking a blood sample to check your fasting blood sugar level.  If you are at a normal weight and have a low risk for diabetes,  have this test once every three years after 68 years of age.  If you are overweight and have a high risk for diabetes, consider being tested at a younger age or more often. PREVENTING INFECTION  Hepatitis B  If you have a higher risk for hepatitis B, you should be screened for this virus. You are considered at high risk for hepatitis B if:  You were born in a country where hepatitis B is common. Ask your health care provider which countries are considered high risk.  Your parents were born in a high-risk country, and you have not been immunized against hepatitis B (hepatitis B vaccine).  You have HIV or AIDS.  You use needles to inject street drugs.  You live with someone who has hepatitis B.  You have had sex with someone who has hepatitis B.  You get hemodialysis treatment.  You take certain medicines for conditions, including cancer, organ transplantation, and autoimmune conditions. Hepatitis C  Blood testing is recommended for:  Everyone born from [birth year] through 1964.  Anyone with known risk factors for hepatitis C. Sexually transmitted infections (STIs)  You should be screened for sexually transmitted infections (STIs) including gonorrhea and chlamydia if:  You are sexually active and are younger than 69 years of age.  You are older than 69 years of age and your health care provider tells you that you are at risk for this type of infection.  Your sexual activity has changed since you were last screened and you are at an increased risk for chlamydia or gonorrhea. Ask your health care provider if you are at risk.  If you do not have HIV, but are at risk, it may be recommended that you take a prescription medicine daily to prevent HIV infection. This is called pre-exposure prophylaxis (PrEP). You are considered at risk if:  You are sexually active and do not regularly use condoms or know the HIV status of your partner(s).  You take drugs by injection.  You are sexually  active with a partner who has HIV. Talk with your health care provider about whether you are at high risk of being infected with HIV. If you choose to begin PrEP, you should first be tested for HIV. You should then be tested every 3 months for as long as you are taking PrEP.  PREGNANCY   If you are premenopausal and you may become pregnant, ask your health care provider about preconception counseling.  If you may  become pregnant, take 400 to 800 micrograms (mcg) of folic acid every day.  If you want to prevent pregnancy, talk to your health care provider about birth control (contraception). OSTEOPOROSIS AND MENOPAUSE   Osteoporosis is a disease in which the bones lose minerals and strength with aging. This can result in serious bone fractures. Your risk for osteoporosis can be identified using a bone density scan.  If you are 63 years of age or older, or if you are at risk for osteoporosis and fractures, ask your health care provider if you should be screened.  Ask your health care provider whether you should take a calcium or vitamin D supplement to lower your risk for osteoporosis.  Menopause may have certain physical symptoms and risks.  Hormone replacement therapy may reduce some of these symptoms and risks. Talk to your health care provider about whether hormone replacement therapy is right for you.  HOME CARE INSTRUCTIONS   Schedule regular health, dental, and eye exams.  Stay current with your immunizations.   Do not use any tobacco products including cigarettes, chewing tobacco, or electronic cigarettes.  If you are pregnant, do not drink alcohol.  If you are breastfeeding, limit how much and how often you drink alcohol.  Limit alcohol intake to no more than 1 drink per day for nonpregnant women. One drink equals 12 ounces of beer, 5 ounces of wine, or 1 ounces of hard liquor.  Do not use street drugs.  Do not share needles.  Ask your health care provider for help if  you need support or information about quitting drugs.  Tell your health care provider if you often feel depressed.  Tell your health care provider if you have ever been abused or do not feel safe at home.   This information is not intended to replace advice given to you by your health care provider. Make sure you discuss any questions you have with your health care provider.   Document Released: 09/13/2010 Document Revised: 03/21/2014 Document Reviewed: 01/30/2013 Elsevier Interactive Patient Education 2016 [employer]. Fall Prevention in the Home  Falls can cause injuries and can affect people from all age groups. There are many simple things that you can do to make your home safe and to help prevent falls. WHAT CAN I DO ON THE OUTSIDE OF MY HOME?  Regularly repair the edges of walkways and driveways and fix any cracks.  Remove high doorway thresholds.  Trim any shrubbery on the main path into your home.  Use bright outdoor lighting.  Clear walkways of debris and clutter, including tools and rocks.  Regularly check that handrails are securely fastened and in good repair. Both sides of any steps should have handrails.  Install guardrails along the edges of any raised decks or porches.  Have leaves, snow, and ice cleared regularly.  Use sand or salt on walkways during winter months.  In the garage, clean up any spills right away, including grease or oil spills. WHAT CAN I DO IN THE BATHROOM?  Use night lights.  Install grab bars by the toilet and in the tub and shower. Do not use towel bars as grab bars.  Use non-skid mats or decals on the floor of the tub or shower.  If you need to sit down while you are in the shower, use a plastic, non-slip stool.[employer]  Keep the floor dry. Immediately clean up any water that spills on the floor.  Remove soap buildup in the tub or shower on a  regular basis.  Attach bath mats securely with double-sided non-slip rug tape.  Remove throw  rugs and other tripping hazards from the floor. WHAT CAN I DO IN THE BEDROOM?  Use night lights.  Make sure that a bedside light is easy to reach.  Do not use oversized bedding that drapes onto the floor.  Have a firm chair that has side arms to use for getting dressed.  Remove throw rugs and other tripping hazards from the floor. WHAT CAN I DO IN THE KITCHEN?   Clean up any spills right away.  Avoid walking on wet floors.  Place frequently used items in easy-to-reach places.  If you need to reach for something above you, use a sturdy step stool that has a grab bar.  Keep electrical cables out of the way.  Do not use floor polish or wax that makes floors slippery. If you have to use wax, make sure that it is non-skid floor wax.  Remove throw rugs and other tripping hazards from the floor. WHAT CAN I DO IN THE STAIRWAYS?  Do not leave any items on the stairs.  Make sure that there are handrails on both sides of the stairs. Fix handrails that are broken or loose. Make sure that handrails are as long as the stairways.  Check any carpeting to make sure that it is firmly attached to the stairs. Fix any carpet that is loose or worn.  Avoid having throw rugs at the top or bottom of stairways, or secure the rugs with carpet tape to prevent them from moving.  Make sure that you have a light switch at the top of the stairs and the bottom of the stairs. If you do not have them, have them installed. WHAT ARE SOME OTHER FALL PREVENTION TIPS?  Wear closed-toe shoes that fit well and support your feet. Wear shoes that have rubber soles or low heels.  When you use a stepladder, make sure that it is completely opened and that the sides are firmly locked. Have someone hold the ladder while you are using it. Do not climb a closed stepladder.  Add color or contrast paint or tape to grab bars and handrails in your home. Place contrasting color strips on the first and last steps.  Use  mobility aids as needed, such as canes, walkers, scooters, and crutches.  Turn on lights if it is dark. Replace any light bulbs that burn out.  Set up furniture so that there are clear paths. Keep the furniture in the same spot.  Fix any uneven floor surfaces.  Choose a carpet design that does not hide the edge of steps of a stairway.  Be aware of any and all pets.  Review your medicines with your healthcare provider. Some medicines can cause dizziness or changes in blood pressure, which increase your risk of falling. Talk with your health care provider about other ways that you can decrease your risk of falls. This may include working with a physical therapist or trainer to improve your strength, balance, and endurance.   This information is not intended to replace advice given to you by your health care provider. Make sure you discuss any questions you have with your health care provider.   Document Released: 02/18/2002 Document Revised: 07/15/2014 Document Reviewed: 04/04/2014 Elsevier Interactive Patient Education 2016 [place]. Diabetes and Foot Care Diabetes may cause you to have problems because of poor blood supply (circulation) to your feet and legs. This may cause the skin on your  feet to become thinner, break easier, and heal more slowly. Your skin may become dry, and the skin may peel and crack. You may also have nerve damage in your legs and feet causing decreased feeling in them. You may not notice minor injuries to your feet that could lead to infections or more serious problems. Taking care of your feet is one of the most important things you can do for yourself.  HOME CARE INSTRUCTIONS  Wear shoes at all times, even in the house. Do not go barefoot. Bare feet are easily injured.  Check your feet daily for blisters, cuts, and redness. If you cannot see the bottom of your feet, use a mirror or ask someone for help.  Wash your feet with warm water (do not use hot water)  and mild soap. Then pat your feet and the areas between your toes until they are completely dry. Do not soak your feet as this can dry your skin.  Apply a moisturizing lotion or petroleum jelly (that does not contain alcohol and is unscented) to the skin on your feet and to dry, brittle toenails. Do not apply lotion between your toes.  Trim your toenails straight across. Do not dig under them or around the cuticle. File the edges of your nails with an emery board or nail file.  Do not cut corns or calluses or try to remove them with medicine.  Wear clean socks or stockings every day. Make sure they are not too tight. Do not wear knee-high stockings since they may decrease blood flow to your legs.  Wear shoes that fit properly and have enough cushioning. To break in new shoes, wear them for just a few hours a day. This prevents you from injuring your feet. Always look in your shoes before you put them on to be sure there are no objects inside.  Do not cross your legs. This may decrease the blood flow to your feet.  If you find a minor scrape, cut, or break in the skin on your feet, keep it and the skin around it clean and dry. These areas may be cleansed with mild soap and water. Do not cleanse the area with peroxide, alcohol, or iodine.  When you remove an adhesive bandage, be sure not to damage the skin around it.  If you have a wound, look at it several times a day to make sure it is healing.  Do not use heating pads or hot water bottles. They may burn your skin. If you have lost feeling in your feet or legs, you may not know it is happening until it is too late.  Make sure your health care provider performs a complete foot exam at least annually or more often if you have foot problems. Report any cuts, sores, or bruises to your health care provider immediately. SEEK MEDICAL CARE IF:   You have an injury that is not healing.  You have cuts or breaks in the skin.  You have an ingrown  nail.  You notice redness on your legs or feet.  You feel burning or tingling in your legs or feet.  You have pain or cramps in your legs and feet.  Your legs or feet are numb.  Your feet always feel cold. SEEK IMMEDIATE MEDICAL CARE IF:   There is increasing redness, swelling, or pain in or around a wound.  There is a red line that goes up your leg.  Pus is coming from a wound.  You  develop a fever or as directed by your health care provider.  You notice a bad smell coming from an ulcer or wound.   This information is not intended to replace advice given to you by your health care provider. Make sure you discuss any questions you have with your health care provider.   Document Released: 02/26/2000 Document Revised: 10/31/2012 Document Reviewed: 08/07/2012 Elsevier Interactive Patient Education [employer].

## 2015-10-27 NOTE — Progress Notes (Signed)
I have reviewed this visit and discussed with Lauren Ducatte, RN, BSN, and agree with her documentation.   Kyle Fletke MD 

## 2015-10-27 NOTE — Progress Notes (Signed)
Subjective:   Tiffany Becker is a 70 y.o. female who presents for Medicare Annual (Subsequent) preventive examination.  Cardiac Risk Factors include: advanced age (>18men, >35 women);diabetes mellitus;hypertension     Objective:     Vitals: BP (!) 150/72 (BP Location: Left Arm, Patient Position: Sitting, Cuff Size: Normal)   Pulse 60   Temp 98.1 F (36.7 C) (Oral)   Ht 5\' 9"  (1.753 m)   Wt 210 lb 3.2 oz (95.3 kg)   SpO2 97%   BMI 31.04 kg/m   Body mass index is 31.04 kg/m.   Tobacco History  Smoking Status  . Never Smoker  Smokeless Tobacco  . Never Used     Counseling given: Not Answered Patient has never smoked and does not plan to start.  Past Medical History:  Diagnosis Date  . Abnormal renal ultrasound 04/25/02  . Diabetes mellitus without complication ([place])   . Diverticulosis of colon 08/12/04  . GERD (gastroesophageal reflux disease)    H pylori neg 08/21/02  . Hypertension   . Peptic ulcer disease 03/14/80   EGD  . Primary osteoarthritis of both hips 4/09   Dr Mayer Camel  . Pyelonephritis 11/02  . Vitreous detachment    Past Surgical History:  Procedure Laterality Date  . BREAST REDUCTION SURGERY  03/14/93  . BUNIONECTOMY  12/09   L with 2nd and 5th toe straightening  . CERVICAL DISCECTOMY  03/14/06   C6-7  . CHOLECYSTECTOMY  01/12/89  . 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 SALPINGOOPHORECTOMY  09/11/97   ruptured cyst  . TONSILLECTOMY  03/14/52  . TOTAL ABDOMINAL HYSTERECTOMY  01/12/89   L salpingoophorectomy for ruptured cyst  . TUBAL LIGATION  03/14/72  . UMBILICAL HERNIA REPAIR  03/14/64   Family History  Problem Relation Age of Onset  . Coronary artery disease Father 52    died age 77  . Heart disease Father   . Stroke Father   . Osteoarthritis Mother   . Asthma Mother   . Heart disease Mother   . Diabetes Brother     bladder, pacemaker, Hemochromatosis  . Cancer Brother 48    Bladder   History  Sexual Activity  . Sexual activity: No     Outpatient Encounter Prescriptions as of 10/27/2015  Medication Sig  . aspirin 81 MG EC tablet Take 1 tablet (81 mg total) by mouth daily. Swallow whole.  . celecoxib (CELEBREX) 100 MG capsule Take 1 capsule (100 mg total) by mouth daily. (Patient taking differently: Take 100 mg by mouth as needed. )  . glyBURIDE (DIABETA) 5 MG tablet TAKE ONE-HALF TABLET BY MOUTH ONCE DAILY FOR  BREAKFAST  . lisinopril (PRINIVIL,ZESTRIL) 40 MG tablet TAKE ONE TABLET BY MOUTH ONCE DAILY  . meloxicam (MOBIC) 7.5 MG tablet TAKE ONE TABLET BY MOUTH ONCE DAILY  . metFORMIN (GLUCOPHAGE) 1000 MG tablet TAKE ONE TABLET BY MOUTH TWICE DAILY WITH MEALS  . pravastatin (PRAVACHOL) 40 MG tablet Take 1 tablet (40 mg total) by mouth daily.   No facility-administered encounter medications on file as of 10/27/2015.     Activities of Daily Living In your present state of health, do you have any difficulty performing the following activities: 10/27/2015  Hearing? N  Vision? N  Difficulty concentrating or making decisions? N  Walking or climbing stairs? N  Dressing or bathing? N  Doing errands, shopping? N  Preparing Food and eating ? N  Using the Toilet? N  In the past six  months, have you accidently leaked urine? Y  Do you have problems with loss of bowel control? N  Managing your Medications? N  Managing your Finances? N  Housekeeping or managing your Housekeeping? N  Some recent data might be hidden   Home Safety:  My home has a working smoke alarm:  Yes, 4-5 throughout home           My home throw rugs have been fastened down to the floor or removed:  Yes I have non-slip mats in the bathtub and shower:  Yes         All my home's stairs have railings or bannisters: two level home with bannisters inside and outside         My home's floors, stairs and hallways are free from clutter, wires and cords:  Yes       Patient Care Team: Lupita Dawn, MD as PCP - General (Family Medicine) Marilynne Halsted, MD  (Ophthalmology) Max Villa Herb, DPM as Consulting Physician (Podiatry)  [facility] for [facility] [place] for [facility]iology Assessment:     Exercise Activities and Dietary recommendations Current Exercise Habits: Home exercise routine, Type of exercise: walking, Time (Minutes): > 60, Frequency (Times/Week): 7, Weekly Exercise (Minutes/Week): 0, Intensity: Mild, Exercise limited by: None identified Patient states she is active and on her feet from time she gets up till time she goes to bed. She is the primary care giver for husband and mother.  Goals    . Blood Pressure < 150/90    . LDL CALC < 100    . Weight (lb) < 195 lb (88.5 kg) (pt-stated)          7% weight loss      Discussed recording consumption intake to assist with desired 7% weight loss.   Fall Risk Fall Risk  10/27/2015 08/17/2015 03/03/2015 10/20/2014 03/17/2014  Falls in the past year? No No No No No   Depression Screen PHQ 2/9 Scores 10/27/2015 08/17/2015 03/03/2015 11/21/2014  PHQ - 2 Score 0 0 0 0     Cognitive Testing MMSE - Mini Mental State Exam 07/02/2013  Orientation to time 5  Orientation to Place 5  Registration 3  Attention/ Calculation 5  Recall 3  Language- name 2 objects 2  Language- repeat 1  Language- follow 3 step command 3  Language- read & follow direction 1  Write a sentence 1  Copy design 1  Total score 30   Mini-Cog  Passed with score 4/5  TUG Test:  Done in 5 seconds   Immunization History  Administered Date(s) Administered  . H1N1 02/25/2008  . Influenza Split 12/06/2011  . Influenza Whole 12/18/2006, 12/14/2007, 01/06/2009  . Influenza,inj,Quad PF,36+ Mos 11/27/2012, 12/09/2013, 11/21/2014  . Pneumococcal Conjugate-13 07/02/2013  . Pneumococcal Polysaccharide-23 02/11/2001, 10/28/2014  . Td 05/12/2004  . Tdap 11/04/2014  . Zoster 08/09/2007   Screening Tests Health Maintenance  Topic Date Due  . Hepatitis C Screening  Dec 30, 1945  . FOOT EXAM  06/19/2014  . COLONOSCOPY   08/13/2014  . HEMOGLOBIN A1C  04/22/2015  . INFLUENZA VACCINE  10/13/2015  . OPHTHALMOLOGY EXAM  10/20/2015  . MAMMOGRAM  10/30/2016  . TETANUS/TDAP  11/03/2024  . DEXA SCAN  Completed  . ZOSTAVAX  Completed  . PNA vac Low Risk Adult  Completed  Hep C and HgbA1c drawn today Foot exam performed today Patient prefers to discuss Fecal Occult Blood Test with PCP rather than schedule a colonoscopy at this time. States eye  exam was done in Spring 2017 at [facility] on [address]  Mammogram scheduled on 11/02/2015   Diabetic Foot Exam - Simple   Simple Foot Form Diabetic Foot exam was performed with the following findings:  Yes 10/27/2015 10:54 AM  Visual Inspection See comments:  Yes Sensation Testing Intact to touch and monofilament testing bilaterally:  Yes Pulse Check Posterior Tibialis and Dorsalis pulse intact bilaterally:  Yes Comments Patient has hard calluses under toes and at outer edges of soles of feet bilaterally. States she walks barefoot in house and outside. Discussed importance of not walking barefoot due to increased risk of undetected skin break and infection. Encouraged to wear well fitting, sturdy shoes in house as well as outside.     Plan:    During the course of the visit the patient was educated and counseled about the following appropriate screening and preventive services:   Vaccines to include Pneumoccal, Influenza, Td, Zostavax  Cardiovascular Disease  Colorectal cancer screening  Bone density screening  Diabetes screening  Glaucoma screening  Mammography/PAP  Nutrition counseling   Patient Instructions (the written plan) was given to the patient.   Velora Heckler, RN  10/27/2015

## 2015-10-28 LAB — HEPATITIS C ANTIBODY: HCV AB: NEGATIVE

## 2015-10-31 ENCOUNTER — Other Ambulatory Visit: Payer: Self-pay | Admitting: Family Medicine

## 2015-11-02 DIAGNOSIS — Z1231 Encounter for screening mammogram for malignant neoplasm of breast: Secondary | ICD-10-CM | POA: Diagnosis not present

## 2015-11-27 ENCOUNTER — Ambulatory Visit (INDEPENDENT_AMBULATORY_CARE_PROVIDER_SITE_OTHER): Payer: Medicare Other | Admitting: *Deleted

## 2015-11-27 DIAGNOSIS — Z23 Encounter for immunization: Secondary | ICD-10-CM | POA: Diagnosis present

## 2015-11-27 NOTE — Progress Notes (Signed)
Pt given a flu shot and tolerated it well. Laneya Gasaway Kennon Holter, CMA

## 2015-12-19 ENCOUNTER — Other Ambulatory Visit: Payer: Self-pay | Admitting: Family Medicine

## 2016-01-18 ENCOUNTER — Other Ambulatory Visit: Payer: Self-pay | Admitting: Family Medicine

## 2016-01-26 DIAGNOSIS — L821 Other seborrheic keratosis: Secondary | ICD-10-CM | POA: Diagnosis not present

## 2016-01-26 DIAGNOSIS — L820 Inflamed seborrheic keratosis: Secondary | ICD-10-CM | POA: Diagnosis not present

## 2016-01-26 DIAGNOSIS — D225 Melanocytic nevi of trunk: Secondary | ICD-10-CM | POA: Diagnosis not present

## 2016-01-26 DIAGNOSIS — Z08 Encounter for follow-up examination after completed treatment for malignant neoplasm: Secondary | ICD-10-CM | POA: Diagnosis not present

## 2016-01-26 DIAGNOSIS — Z85828 Personal history of other malignant neoplasm of skin: Secondary | ICD-10-CM | POA: Diagnosis not present

## 2016-02-09 ENCOUNTER — Telehealth: Payer: Self-pay | Admitting: Family Medicine

## 2016-02-09 NOTE — Telephone Encounter (Signed)
Attempted to call patient, she was not home but spoke with her husband, the patient is requesting a topical medication (5% lidocaine and 5% baclofen) that husband takes for OA. Fax number of pharmacy is [phone] and phone number is [phone]. As patient has a history of OA I will attempt to send to pharmacy ([facility], name found by searching phone number).  Spoke with pharmacy and they do have this combination with diclofenac. She is going to fax a prescription pad to the office.

## 2016-02-09 NOTE — Telephone Encounter (Signed)
Patient asks prescrIption for "Topical pain cream" and to be faxed to Pharmacy's [phone].  Please, follow up ASAP.

## 2016-02-11 NOTE — Telephone Encounter (Signed)
RN staff - I did not receive a fax from this pharmacy, please call again and ask for script of compounded medications, thanks

## 2016-02-16 NOTE — Telephone Encounter (Signed)
Called [facility], spoke with calli. She is going to fax over script. Nat Christen, CMA

## 2016-02-18 MED ORDER — DICLOFENAC SODIUM 1 % TD GEL: TRANSDERMAL | 2 refills | Status: DC

## 2016-02-18 NOTE — Addendum Note (Signed)
Addended by: Lupita Dawn on: 02/18/2016 02:19 PM   Modules accepted: Orders

## 2016-02-18 NOTE — Telephone Encounter (Signed)
Spoke to patient. Medicare does not cover the compounded medication. Interested in Voltaren gel. Will send in prescription.

## 2016-03-16 ENCOUNTER — Other Ambulatory Visit: Payer: Self-pay | Admitting: Family Medicine

## 2016-03-16 MED ORDER — LISINOPRIL 40 MG PO TABS: 40.0000 mg | ORAL_TABLET | Freq: Every day | ORAL | 1 refills | Status: DC

## 2016-03-16 MED ORDER — METFORMIN HCL 1000 MG PO TABS: 1000.0000 mg | ORAL_TABLET | Freq: Two times a day (BID) | ORAL | 1 refills | Status: DC

## 2016-03-16 MED ORDER — GLYBURIDE 5 MG PO TABS: ORAL_TABLET | ORAL | 1 refills | Status: DC

## 2016-04-08 ENCOUNTER — Telehealth: Payer: Self-pay | Admitting: *Deleted

## 2016-04-08 NOTE — Telephone Encounter (Signed)
Prior Authorization received from [employer]  for glyburide 5 mg. PA form faxed to [facility] for review.  Review process could take 24-72 hours to complete. Derl Barrow, RN

## 2016-04-11 ENCOUNTER — Other Ambulatory Visit: Payer: Self-pay | Admitting: Family Medicine

## 2016-04-11 NOTE — Telephone Encounter (Signed)
Spoke with patient. A1C has been well controlled. Will discontinue Glyburide.

## 2016-04-11 NOTE — Telephone Encounter (Signed)
PA for Glyburide 5 mg tab denied via [facility].  Reference number: [id number].  Denial placed in provider box for review.  Derl Barrow, RN

## 2016-06-16 ENCOUNTER — Other Ambulatory Visit: Payer: Self-pay | Admitting: Family Medicine

## 2016-06-28 ENCOUNTER — Other Ambulatory Visit: Payer: Self-pay | Admitting: Family Medicine

## 2016-06-28 DIAGNOSIS — Z634 Disappearance and death of family member: Secondary | ICD-10-CM

## 2016-06-28 MED ORDER — ALPRAZOLAM 1 MG PO TABS: 1.0000 mg | ORAL_TABLET | Freq: Three times a day (TID) | ORAL | 0 refills | Status: DC | PRN

## 2016-06-28 NOTE — Progress Notes (Unsigned)
Received call from patient's daughter Lelon Frohlich. The mother of patient Tiffany Becker) is currently on Hospice and will pass away in next 1-2 days. The patient is very distressed and daughter asked for help with anxiety. Patient did take 0.5 mg Ativan and did not help much. Will call in short course of Xanax.

## 2016-07-20 NOTE — Progress Notes (Addendum)
   Subjective:    Patient ID: Tiffany Becker, female    DOB: 01/24/1946, 71 y.o.   MRN: [id number]  HPI 71 y/o female presents for follow up of bilateral shoulder pain.  Bilateral Shoulder Pain Evaluated last in 02/2015. Given bilateral subacromial steroid injection. Takes Celebrex as needed. History of cervical disectomy (plates and screws placed). She is having bilateral shoulder pain (R>L), worse with overhead movements, would like another injection if possible.   HTN Currently on Lisinopril 40 mg daily, no chest pain, no headaches; 120's-130's/60's-70's.   Social Her mother recently passed away from COPD/CHF. Given short course of ativan for bereavement. She states that she has only taken a few times, she has good social support from family    Review of Systems See above    Objective:   Physical Exam BP (!) 158/82   Pulse 96   Temp 97.6 F (36.4 C) (Oral)   Ht 5\' 9"  (1.753 m)   Wt 198 lb (89.8 kg)   BMI 29.24 kg/m  Gen: pleasant female, NAD MSK: Cervical - no midline or paraspinal tenderness, ROM is diminished in all directions, negative Spurling's bilaterally; Right shoulder - tender over trapezius and AC joint, strength 5/5 to empty can, lift off (from abdomen as unable to reach low back) and ER. + neer and Hawkings testing. Left shoulder - ROM is full, no tenderness, strength 5/5 to empty can, lift off, and ER. Negative Neer and Hawkings   Left shoulder x-ray 03/2015 AC and GH degenerative changes.   Right shoulder x-ray 03/2015 Moderate degenerative change of the acromioclavicular joint. Moderate glenohumeral arthritis with narrowing and osteophyte formation. Moderate bony hypertrophy of the greater tuberosity suggesting rotator cuff degenerative change. No fracture or dislocation.  Procedure Note: Subacromial Shoulder Injection Right The risks and benefits of the procedure were discussed with the patient. Written consent was obtained. The posterior shoulder was  prepped in a sterile fashion. A total of 40 mg DepoMedrol and 5 cc Lidocaine without Epi were injected into the subacromial space using a 25 gauge 1.5 inch need. The patient tolerate the procedure well. Hemostasis achieved. BandAid applied.        Assessment & Plan:  Bilateral shoulder pain Right > Left. Clinically consistent with OA (based on exam and previous xrays). Rotation cuff testing unremarkable.  -steroid injection performed on right shoulder -continue Celebrex  Essential hypertension, benign Slightly elevated today. Suspect due to recent stress/bereavement from recently losing her mother.  -continue Lisinopril, recheck at next visit, patient to check pressures at home and bring to next visit.   Bereavement Patient recently lost her mother. Mood is slowly improving. Has good social support. -continue prn Ativan, suspect will not need to refill.

## 2016-07-22 ENCOUNTER — Encounter: Payer: Self-pay | Admitting: Family Medicine

## 2016-07-22 ENCOUNTER — Ambulatory Visit (INDEPENDENT_AMBULATORY_CARE_PROVIDER_SITE_OTHER): Payer: Medicare Other | Admitting: Family Medicine

## 2016-07-22 VITALS — BP 158/82 | HR 96 | Temp 97.6°F | Ht 69.0 in | Wt 198.0 lb

## 2016-07-22 DIAGNOSIS — Z634 Disappearance and death of family member: Secondary | ICD-10-CM | POA: Diagnosis not present

## 2016-07-22 DIAGNOSIS — M25511 Pain in right shoulder: Secondary | ICD-10-CM

## 2016-07-22 DIAGNOSIS — I10 Essential (primary) hypertension: Secondary | ICD-10-CM

## 2016-07-22 DIAGNOSIS — M25512 Pain in left shoulder: Secondary | ICD-10-CM | POA: Diagnosis not present

## 2016-07-22 MED ORDER — METHYLPREDNISOLONE ACETATE 40 MG/ML IJ SUSP
40.0000 mg | Freq: Once | INTRAMUSCULAR | Status: AC
  Administered 2016-07-22: 40 mg via INTRAMUSCULAR

## 2016-07-22 NOTE — Assessment & Plan Note (Signed)
Right > Left. Clinically consistent with OA (based on exam and previous xrays). Rotation cuff testing unremarkable.  -steroid injection performed on right shoulder -continue Celebrex

## 2016-07-22 NOTE — Assessment & Plan Note (Signed)
Patient recently lost her mother. Mood is slowly improving. Has good social support. -continue prn Ativan, suspect will not need to refill.

## 2016-07-22 NOTE — Assessment & Plan Note (Signed)
Slightly elevated today. Suspect due to recent stress/bereavement from recently losing her mother.  -continue Lisinopril, recheck at next visit, patient to check pressures at home and bring to next visit.

## 2016-07-27 NOTE — Progress Notes (Signed)
71 y.o. year old female presents for well woman/preventative visit and annual GYN examination.  Acute Concerns: Type 2 DM - currently takes Metformin 1000 mg BID, due for eye exam HTN - home blood pressure 130-140's/60's, rare lightheadedness, no chest pain, occasinoal soreness of chest (related to previous breast reduction)  Exercise: No formal exercise, very active at home  Sexual/Birth History: Not currently sexually active with husband, has pain with intercourse after previous hysterectomy  Birth Control: s/p Hysterectomy  POA/Living Will: Husband John  Social:  Social History   Social History  . Marital status: Married    Spouse name: Jenny Reichmann  . Number of children: 2  . Years of education: 12   Occupational History  . Retired-Sales of store fixtures    Social History Main Topics  . Smoking status: Never Smoker  . Smokeless tobacco: Never Used  . Alcohol use No  . Drug use: No  . Sexual activity: No   Other Topics Concern  . None   Social History Narrative   Retired- Press photographer   2 daughters in the area      [place]:    Emergency Contact: husband, Jenny Reichmann [phone]   End of Life Plan: pt has advance directives but does not have in chart, requested copy from family.   Who lives with you: husband and mother   Any pets: dog   Diet: Pt has a varied diet of protein, starch and vegetables.   Exercise: Pt reports walking dog daily.   Seatbelts: Pt reports wearing seatbelt when in vehicles.    Nancy Fetter Exposure/Protection: Pt reports using sunscreen regularly.    Hobbies: church, walking, yard work, cooking             Immunization: Immunization History  Administered Date(s) Administered  . H1N1 02/25/2008  . Influenza Split 12/06/2011  . Influenza Whole 12/18/2006, 12/14/2007, 01/06/2009  . Influenza,inj,Quad PF,36+ Mos 11/27/2012, 12/09/2013, 11/21/2014, 11/27/2015  . Pneumococcal Conjugate-13 07/02/2013  . Pneumococcal Polysaccharide-23 02/11/2001, 10/28/2014   . Td 05/12/2004  . Tdap 11/04/2014  . Zoster 08/09/2007    Cancer Screening:  Pap Smear: s/p Total Abdominal Hysterectomy, no vaginal irritation/dischage/skin changes  Mammogram: last completed 10/2014 (BiRads 1), no breast changes/masses  Colonoscopy: last in 2006, due for repeat, interested in cologuard  Dexa: completed 10/2014 (osteopenic)  POC A1C 7.0  Physical Exam: VITALS: Reviewed GEN: Pleasant female, NAD HEENT: Normocephalic, PERRL, EOMI, no scleral icterus, bilateral TM pearly grey, nasal septum midline, MMM, uvula midline, no anterior or posterior lymphadenopathy, no thyromegaly CARDIAC:RRR, S1 and S2 present, no murmur, no heaves/thrills RESP: CTAB, normal effort BREAST:Deferred as patient asymptomatic ABD: soft, no tenderness, normal bowel sounds GU/GYN:Deferred as patient asymptomatic EXT: No edema, 2+ radial and DP pulses SKIN: no rash  ASSESSMENT & PLAN: 71 y.o. female presents for annual well woman/preventative exam and GYN exam. Please see problem specific assessment and plan.   Preventative health care 71 y/o female presents for well woman/preventative visit.  -no need for pap smear as previous hysterectomy -up to date on mammogram (repeat August 2018) -due for repeat DEXA in August 2018 -up to date on immunizations -stool test ordered to screen for colon cancer -encouraged healthy diet and regular exercise -cmp, cbc, lipid profile ordered today  Diabetes mellitus without complication Controlled. A1C 7.0. -continue Metformin 1000 mg BID -foot exam completed today -patient to schedule eye exam  Essential hypertension, benign Elevated in office. However, well controlled based on home readings. Suspect white coat hypertension. -no changes in  medications today -consider 24 hour home BP monitor if she continues to have elevated readings in office

## 2016-07-29 ENCOUNTER — Ambulatory Visit (INDEPENDENT_AMBULATORY_CARE_PROVIDER_SITE_OTHER): Payer: Medicare Other | Admitting: Family Medicine

## 2016-07-29 ENCOUNTER — Encounter: Payer: Self-pay | Admitting: Family Medicine

## 2016-07-29 VITALS — BP 162/68 | HR 69 | Temp 98.2°F | Ht 69.0 in | Wt 198.8 lb

## 2016-07-29 DIAGNOSIS — I10 Essential (primary) hypertension: Secondary | ICD-10-CM | POA: Diagnosis not present

## 2016-07-29 DIAGNOSIS — Z1211 Encounter for screening for malignant neoplasm of colon: Secondary | ICD-10-CM | POA: Diagnosis not present

## 2016-07-29 DIAGNOSIS — Z0000 Encounter for general adult medical examination without abnormal findings: Secondary | ICD-10-CM | POA: Diagnosis not present

## 2016-07-29 DIAGNOSIS — E119 Type 2 diabetes mellitus without complications: Secondary | ICD-10-CM

## 2016-07-29 LAB — POCT GLYCOSYLATED HEMOGLOBIN (HGB A1C): Hemoglobin A1C: 7

## 2016-07-29 NOTE — Patient Instructions (Addendum)
It was great to see you today.  Diabetes - A1C at 7 today, continue Metformin  Blood Pressure - controlled at home,   Due for Mammogram and Bone Density Test in August of this year.  Please make an eye doctor appointment.   Please complete the stool test for colon cancer screen.

## 2016-07-29 NOTE — Assessment & Plan Note (Signed)
71 y/o female presents for well woman/preventative visit.  -no need for pap smear as previous hysterectomy -up to date on mammogram (repeat August 2018) -due for repeat DEXA in August 2018 -up to date on immunizations -stool test ordered to screen for colon cancer -encouraged healthy diet and regular exercise -cmp, cbc, lipid profile ordered today

## 2016-07-29 NOTE — Assessment & Plan Note (Signed)
Controlled. A1C 7.0. -continue Metformin 1000 mg BID -foot exam completed today -patient to schedule eye exam

## 2016-07-29 NOTE — Assessment & Plan Note (Signed)
Elevated in office. However, well controlled based on home readings. Suspect white coat hypertension. -no changes in medications today -consider 24 hour home BP monitor if she continues to have elevated readings in office

## 2016-07-30 LAB — CBC
HEMATOCRIT: 44.3 % (ref 34.0–46.6)
Hemoglobin: 14.8 g/dL (ref 11.1–15.9)
MCH: 30 pg (ref 26.6–33.0)
MCHC: 33.4 g/dL (ref 31.5–35.7)
MCV: 90 fL (ref 79–97)
Platelets: 274 10*3/uL (ref 150–379)
RBC: 4.94 x10E6/uL (ref 3.77–5.28)
RDW: 13.8 % (ref 12.3–15.4)
WBC: 8 10*3/uL (ref 3.4–10.8)

## 2016-07-30 LAB — CMP14+EGFR
ALBUMIN: 4.5 g/dL (ref 3.6–4.8)
ALT: 17 IU/L (ref 0–32)
AST: 16 IU/L (ref 0–40)
Albumin/Globulin Ratio: 2 (ref 1.2–2.2)
Alkaline Phosphatase: 82 IU/L (ref 39–117)
BUN / CREAT RATIO: 10 — AB (ref 12–28)
BUN: 10 mg/dL (ref 8–27)
Bilirubin Total: 0.3 mg/dL (ref 0.0–1.2)
CALCIUM: 10 mg/dL (ref 8.7–10.3)
CO2: 22 mmol/L (ref 18–29)
CREATININE: 0.96 mg/dL (ref 0.57–1.00)
Chloride: 92 mmol/L — ABNORMAL LOW (ref 96–106)
GFR calc Af Amer: 70 mL/min/{1.73_m2} (ref >59)
GFR, EST NON AFRICAN AMERICAN: 61 mL/min/{1.73_m2} (ref >59)
GLOBULIN, TOTAL: 2.2 g/dL (ref 1.5–4.5)
GLUCOSE: 189 mg/dL — AB (ref 65–99)
Potassium: 5 mmol/L (ref 3.5–5.2)
Sodium: 132 mmol/L — ABNORMAL LOW (ref 134–144)
TOTAL PROTEIN: 6.7 g/dL (ref 6.0–8.5)

## 2016-07-30 LAB — LIPID PANEL
CHOL/HDL RATIO: 2.3 ratio (ref 0.0–4.4)
Cholesterol, Total: 162 mg/dL (ref 100–199)
HDL: 69 mg/dL (ref >39)
LDL Calculated: 70 mg/dL (ref 0–99)
Triglycerides: 114 mg/dL (ref 0–149)
VLDL CHOLESTEROL CAL: 23 mg/dL (ref 5–40)

## 2016-08-04 ENCOUNTER — Other Ambulatory Visit: Payer: Self-pay | Admitting: Family Medicine

## 2016-08-09 ENCOUNTER — Telehealth: Payer: Self-pay | Admitting: Family Medicine

## 2016-08-09 DIAGNOSIS — E7800 Pure hypercholesterolemia, unspecified: Secondary | ICD-10-CM

## 2016-08-09 DIAGNOSIS — E785 Hyperlipidemia, unspecified: Secondary | ICD-10-CM

## 2016-08-09 MED ORDER — ATORVASTATIN CALCIUM 40 MG PO TABS: 40.0000 mg | ORAL_TABLET | Freq: Every day | ORAL | 1 refills | Status: DC

## 2016-08-09 NOTE — Telephone Encounter (Signed)
Called and discussed lab results. Glucose elevated likely due to eating breakfast before coming in. Na a little low - will monitor periodically. ASCVD calculated at 28.1%. Patient agreeable to switch to Lipitor.

## 2016-08-09 NOTE — Assessment & Plan Note (Signed)
ASCVD calculated at 28.1%. Patient agreeable to switch to Lipitor.

## 2016-08-10 DIAGNOSIS — Z1211 Encounter for screening for malignant neoplasm of colon: Secondary | ICD-10-CM | POA: Diagnosis not present

## 2016-08-14 LAB — FECAL OCCULT BLOOD, IMMUNOCHEMICAL: Fecal Occult Bld: NEGATIVE

## 2016-08-15 ENCOUNTER — Telehealth: Payer: Self-pay | Admitting: Family Medicine

## 2016-08-15 NOTE — Telephone Encounter (Signed)
Notified patient of negative immunochemical test for colon cancer screening.

## 2016-09-26 ENCOUNTER — Ambulatory Visit: Payer: Self-pay | Admitting: Podiatry

## 2016-11-02 DIAGNOSIS — Z1231 Encounter for screening mammogram for malignant neoplasm of breast: Secondary | ICD-10-CM | POA: Diagnosis not present

## 2016-12-23 ENCOUNTER — Encounter: Payer: Self-pay | Admitting: Family Medicine

## 2016-12-23 ENCOUNTER — Ambulatory Visit (INDEPENDENT_AMBULATORY_CARE_PROVIDER_SITE_OTHER): Payer: Medicare Other | Admitting: Family Medicine

## 2016-12-23 VITALS — BP 152/68 | HR 67 | Temp 98.2°F | Ht 69.0 in | Wt 191.0 lb

## 2016-12-23 DIAGNOSIS — E119 Type 2 diabetes mellitus without complications: Secondary | ICD-10-CM | POA: Diagnosis not present

## 2016-12-23 DIAGNOSIS — Z0000 Encounter for general adult medical examination without abnormal findings: Secondary | ICD-10-CM

## 2016-12-23 DIAGNOSIS — Z634 Disappearance and death of family member: Secondary | ICD-10-CM

## 2016-12-23 DIAGNOSIS — Z1231 Encounter for screening mammogram for malignant neoplasm of breast: Secondary | ICD-10-CM

## 2016-12-23 DIAGNOSIS — Z23 Encounter for immunization: Secondary | ICD-10-CM

## 2016-12-23 DIAGNOSIS — F5104 Psychophysiologic insomnia: Secondary | ICD-10-CM

## 2016-12-23 DIAGNOSIS — I10 Essential (primary) hypertension: Secondary | ICD-10-CM | POA: Diagnosis not present

## 2016-12-23 LAB — POCT GLYCOSYLATED HEMOGLOBIN (HGB A1C): Hemoglobin A1C: 7.6

## 2016-12-23 MED ORDER — MIRTAZAPINE 7.5 MG PO TABS: 7.5000 mg | ORAL_TABLET | Freq: Every day | ORAL | 0 refills | Status: DC

## 2016-12-23 NOTE — Assessment & Plan Note (Signed)
Patient states she has not been able to mourn the loss of her mother (died in 07/18/2016). Also has lost many friends and family members within the last year and husband's health is declining. Currently with LOA, 20lb weight loss in the last year. Feels she has a good support system with family and her faith. Wanting something for sleep. Offered Integrated Care today but patient declined.  - Remeron 7.5mg  qhs - Follow up in 3-4 weeks. At that time, reassess need for [facility]

## 2016-12-23 NOTE — Patient Instructions (Signed)
It was great to see you!  I suspect your high blood pressures, insomnia, and lack of an appetite are all due to the stressful situations in your life and loss of dear friends and family members. - We are not making any blood pressure medication changes today. We do not want to dip your blood pressures too low. I expect that as you continue to process and handle the stressful situations in your life, your blood pressures will improve. - I am hopeful that the good support systems you have in your life as well as your faith will help you as well. - I am prescribing a medication to help you sleep. I hope that as you are able to sleep better, you will begin to feel better as well. - I want to see you back in 3-4 weeks to make sure you are tolerating the medication well. - If you do decide you want to talk to someone in our [facility] team to help process your grief, let us know and we can set that up for you.  For your diabetes, - Your A1c today is 7.6 - No medication changes today. - We will keep an eye on it and check it again in 3 months.  Take care and seek immediate care sooner if you develop any concerns.   Rory Percy, DO [facility]

## 2016-12-23 NOTE — Assessment & Plan Note (Signed)
Flu shot given today. Patient will make appointment for eye exam. Will obtain records from completed mammogram. Cologuard negative from 07/2016.

## 2016-12-23 NOTE — Progress Notes (Signed)
Subjective:   Patient ID: Tiffany Becker    DOB: [birth year], 70 y.o. female   MRN: [id number]  Tiffany Becker is a 71 y.o. female with a history of HTN, diabetes here for   Hypertension: - Medications: lisinopril 40mg  - Compliance: yes - Checking BP at home: yes, 175/92, 165/81, 177/71, 171/70, arm cuff. Off/on for about a year when stressed. 152/68 today. - Denies any SOB, CP, vision changes, LE edema, medication SEs, or symptoms of hypotension - Diet: doesn't eat a lot of salt, LOA recently. Today protein drink, coffee, handful of grits. - Exercise: walks  Diabetes, Type 2 - Doesn't feel as well after taking off of glyburide 0.5 tab, was taken off around December 2017. - Last A1c 7.0 07/2016 - Medications: Metformin 1000mg  BID - Compliance: yes - Diet/Exercise: see above - Eye exam: is calling to schedule soon, last one 2017 - Foot exam: 07/2016 - Denies symptoms of hypoglycemia, polyuria, polydipsia, numbness extremities, foot ulcers/trauma  Grief - Recent LOA, 20lb weight loss in a year, Doesn't sleep well - Lost around 6 people close to her within the last year (aunt and sister in law last October, mom in 07-15-2022, church members close to her recently), Husband's health not good - Wants something for anxiety and to help her sleep  Healthcare Maintenance - Vaccines: flu - wants - Colonoscopy: 08/2004, due 2016 - cologuard a few months ago - Mammogram: 10/2014, due 10/2016 - had in August, at [place]: completed 10/2014 - A1c: 7.0 07/2016 - Lipid Panel: 07/2016 - eye exam: 10/2014, due 10/2015 - calling to make appointment - no longer taking ASA as was told by her cardiologist that it "doesn't help women."  Review of Systems:  Per HPI.   [place]: reviewed. Smoking status reviewed. Medications reviewed.  Objective:   BP (!) 152/68   Pulse 67   Temp 98.2 F (36.8 C) (Oral)   Ht 5\' 9"  (1.753 m)   Wt 191 lb (86.6 kg)   SpO2 98%   BMI 28.21 kg/m  Vitals  and nursing note reviewed.  General: well nourished, well developed, in no acute distress with sad appearance.  HEENT: normocephalic, atraumatic, moist mucous membranes Neck: supple, non-tender without lymphadenopathy CV: regular rate and rhythm without murmurs, rubs, or gallops, Lungs: clear to auscultation bilaterally with normal work of breathing Skin: warm, dry, no rashes or lesions Extremities: warm and well perfused, normal tone MSK: Full ROM, strength intact, gait normal Neuro: Alert and oriented, speech normal  Assessment & Plan:   Diabetes mellitus without complication Last H8I 7.0 07/2016. A1c today 7.6. Compliant with current regimen of Metformin 1000mg  BID.  - continue current regimen and recheck A1c in 3 months. May consider adding another medication to her regimen at that time if appetite improves and gains weight.  Bereavement Patient states she has not been able to mourn the loss of her mother (died in 07/14/16). Also has lost many friends and family members within the last year and husband's health is declining. Currently with LOA, 20lb weight loss in the last year. Feels she has a good support system with family and her faith. Wanting something for sleep. Offered Integrated Care today but patient declined.  - Remeron 7.5mg  qhs - Follow up in 3-4 weeks. At that time, reassess need for [facility]  Preventative health care Flu shot given today. Patient will make appointment for eye exam. Will obtain records from completed mammogram. Cologuard negative from 07/2016.  Essential hypertension,  benign Recent high blood pressures taken at home - likely due to stressful life situations and continued bereavement. 152/68 today. With wide pulse pressure and age, hesitant to start additional blood pressure medications. Expect high BP to normalize on current regimen as she processes through her bereavement process.  - Continue current regimen of Lisinopril 40mg .  Orders Placed This Encounter   Procedures  . Flu Vaccine QUAD 36+ mos IM  . HgB A1c   Meds ordered this encounter  Medications  . mirtazapine (REMERON) 7.5 MG tablet    Sig: Take 1 tablet (7.5 mg total) by mouth at bedtime.    Dispense:  30 tablet    Refill:  0    Rory Percy, DO PGY-1, [facility] 12/23/2016 4:49 PM

## 2016-12-23 NOTE — Assessment & Plan Note (Signed)
Last A1c 7.0 07/2016. A1c today 7.6. Compliant with current regimen of Metformin 1000mg  BID.  - continue current regimen and recheck A1c in 3 months. May consider adding another medication to her regimen at that time if appetite improves and gains weight.

## 2016-12-23 NOTE — Assessment & Plan Note (Signed)
Recent high blood pressures taken at home - likely due to stressful life situations and continued bereavement. 152/68 today. With wide pulse pressure and age, hesitant to start additional blood pressure medications. Expect high BP to normalize on current regimen as she processes through her bereavement process.  - Continue current regimen of Lisinopril 40mg .

## 2016-12-26 ENCOUNTER — Encounter: Payer: Self-pay | Admitting: Family Medicine

## 2017-01-22 NOTE — Progress Notes (Signed)
   Subjective:   Patient ID: Tiffany Becker    DOB: [birth year], 71 y.o. female   MRN: [id number]  Tiffany Becker is a 71 y.o. female with a history of HTN, DM2, OA here for   Bereavement - Last office visit 10/12 with recent loss of friends and family members with loss of mother in 06/2016. Offered Integrate Care at that time but patient declined. - On Remeron 7.5mg  qhs PRN - taking Remeron maybe once a week, "knocks her out" and reports good sleep - Sometimes cuts in half, when she takes a whole one, tends to have a groggy feeling the next morning, but no dizziness or staggering. - Coping mechanisms: endorses good support system with church family and friends. Staying busy, currently remodeling her home. - PHQ9 - 1 (see below)  Review of Systems:  Per HPI.   [place]: reviewed. Smoking status reviewed. Medications reviewed.  Objective:   BP (!) 148/80   Pulse 75   Temp 98.7 F (37.1 C) (Oral)   Ht 5\' 9"  (1.753 m)   Wt 195 lb (88.5 kg)   SpO2 99%   BMI 28.80 kg/m  Vitals and nursing note reviewed.  General: well nourished, well developed, in no acute distress with non-toxic appearance Neck: supple, non-tender without lymphadenopathy CV: regular rate and rhythm without murmurs, rubs, or gallops Lungs: clear to auscultation bilaterally with normal work of breathing Skin: warm, dry, no rashes or lesions Extremities: warm and well perfused, normal tone MSK: Full ROM, strength intact, gait normal Neuro: Alert and oriented, speech normal  Depression screen PHQ 2/9 01/23/2017  Decreased Interest 0  Down, Depressed, Hopeless 0  PHQ - 2 Score 0  Altered sleeping 1  Tired, decreased energy 0  Change in appetite 0  Feeling bad or failure about yourself  0  Trouble concentrating 0  Moving slowly or fidgety/restless 0  Suicidal thoughts 0  PHQ-9 Score 1  Difficult doing work/chores Not difficult at all    Assessment & Plan:   Bereavement Patient states is doing better and  processing through mother's death. Sleeping better on Remeron, taking maybe once per week. States she feels better since getting adequate sleep. PHQ9 today 1. Would like to continue as needed. - continue Remeron 7.5mg  qhs as needed for sleep  No orders of the defined types were placed in this encounter.  No orders of the defined types were placed in this encounter.   Rory Percy, DO PGY-1, [facility] 01/23/2017 3:01 PM

## 2017-01-23 ENCOUNTER — Ambulatory Visit (INDEPENDENT_AMBULATORY_CARE_PROVIDER_SITE_OTHER): Payer: Medicare Other | Admitting: Family Medicine

## 2017-01-23 ENCOUNTER — Encounter: Payer: Self-pay | Admitting: Family Medicine

## 2017-01-23 ENCOUNTER — Other Ambulatory Visit: Payer: Self-pay

## 2017-01-23 VITALS — BP 148/80 | HR 75 | Temp 98.7°F | Ht 69.0 in | Wt 195.0 lb

## 2017-01-23 DIAGNOSIS — Z634 Disappearance and death of family member: Secondary | ICD-10-CM | POA: Diagnosis not present

## 2017-01-23 NOTE — Assessment & Plan Note (Signed)
Patient states is doing better and processing through mother's death. Sleeping better on Remeron, taking maybe once per week. States she feels better since getting adequate sleep. PHQ9 today 1. Would like to continue as needed. - continue Remeron 7.5mg  qhs as needed for sleep

## 2017-01-23 NOTE — Patient Instructions (Signed)
It was great to see you!  For your trouble sleeping,  - I am so glad you are feeling better and having good effects from the Remeron! Continue using it as needed to help you sleep.  We will recheck another A1c in January and adjust medications as needed. Be sure to schedule an eye exam soon.  Take care and seek immediate care sooner if you develop any concerns. It was a pleasure caring for you today!  Rory Percy, DO [facility]

## 2017-02-28 ENCOUNTER — Other Ambulatory Visit: Payer: Self-pay | Admitting: Family Medicine

## 2017-02-28 MED ORDER — METFORMIN HCL 1000 MG PO TABS: ORAL_TABLET | ORAL | 1 refills | Status: DC

## 2017-03-27 DIAGNOSIS — M7541 Impingement syndrome of right shoulder: Secondary | ICD-10-CM | POA: Diagnosis not present

## 2017-03-27 DIAGNOSIS — M25531 Pain in right wrist: Secondary | ICD-10-CM | POA: Diagnosis not present

## 2017-03-31 ENCOUNTER — Other Ambulatory Visit: Payer: Self-pay | Admitting: *Deleted

## 2017-04-04 ENCOUNTER — Other Ambulatory Visit: Payer: Self-pay | Admitting: *Deleted

## 2017-04-04 DIAGNOSIS — E785 Hyperlipidemia, unspecified: Secondary | ICD-10-CM

## 2017-04-07 MED ORDER — LISINOPRIL 40 MG PO TABS: 40.0000 mg | ORAL_TABLET | Freq: Every day | ORAL | 0 refills | Status: DC

## 2017-04-07 MED ORDER — ATORVASTATIN CALCIUM 40 MG PO TABS: 40.0000 mg | ORAL_TABLET | Freq: Every day | ORAL | 1 refills | Status: DC

## 2017-05-29 ENCOUNTER — Other Ambulatory Visit: Payer: Self-pay | Admitting: Family Medicine

## 2017-05-31 NOTE — Telephone Encounter (Signed)
Pt scheduled for an appt. Jacqui Headen, CMA  

## 2017-05-31 NOTE — Telephone Encounter (Signed)
Please ask patient to schedule follow up visit with me as I have never met her as her new PCP Thanks Leeanne Rio, MD

## 2017-06-06 ENCOUNTER — Emergency Department (HOSPITAL_BASED_OUTPATIENT_CLINIC_OR_DEPARTMENT_OTHER)
Admission: EM | Admit: 2017-06-06 | Discharge: 2017-06-06 | Disposition: A | Discharge status: Home / Self Care | Payer: Medicare Other | Attending: Emergency Medicine | Admitting: Emergency Medicine

## 2017-06-06 ENCOUNTER — Encounter (HOSPITAL_BASED_OUTPATIENT_CLINIC_OR_DEPARTMENT_OTHER): Payer: Self-pay | Admitting: *Deleted

## 2017-06-06 ENCOUNTER — Emergency Department (HOSPITAL_BASED_OUTPATIENT_CLINIC_OR_DEPARTMENT_OTHER): Payer: Medicare Other

## 2017-06-06 ENCOUNTER — Telehealth: Payer: Self-pay | Admitting: Family Medicine

## 2017-06-06 ENCOUNTER — Other Ambulatory Visit: Payer: Self-pay

## 2017-06-06 DIAGNOSIS — E119 Type 2 diabetes mellitus without complications: Secondary | ICD-10-CM | POA: Insufficient documentation

## 2017-06-06 DIAGNOSIS — I10 Essential (primary) hypertension: Secondary | ICD-10-CM | POA: Diagnosis not present

## 2017-06-06 DIAGNOSIS — Z7984 Long term (current) use of oral hypoglycemic drugs: Secondary | ICD-10-CM | POA: Insufficient documentation

## 2017-06-06 DIAGNOSIS — M791 Myalgia, unspecified site: Secondary | ICD-10-CM | POA: Diagnosis not present

## 2017-06-06 DIAGNOSIS — M7121 Synovial cyst of popliteal space [Baker], right knee: Secondary | ICD-10-CM

## 2017-06-06 DIAGNOSIS — Y999 Unspecified external cause status: Secondary | ICD-10-CM | POA: Insufficient documentation

## 2017-06-06 DIAGNOSIS — M79661 Pain in right lower leg: Secondary | ICD-10-CM | POA: Diagnosis not present

## 2017-06-06 DIAGNOSIS — Y939 Activity, unspecified: Secondary | ICD-10-CM | POA: Insufficient documentation

## 2017-06-06 DIAGNOSIS — Z79899 Other long term (current) drug therapy: Secondary | ICD-10-CM | POA: Insufficient documentation

## 2017-06-06 DIAGNOSIS — T148XXA Other injury of unspecified body region, initial encounter: Secondary | ICD-10-CM

## 2017-06-06 DIAGNOSIS — X58XXXA Exposure to other specified factors, initial encounter: Secondary | ICD-10-CM | POA: Insufficient documentation

## 2017-06-06 DIAGNOSIS — Y929 Unspecified place or not applicable: Secondary | ICD-10-CM | POA: Insufficient documentation

## 2017-06-06 DIAGNOSIS — S86911A Strain of unspecified muscle(s) and tendon(s) at lower leg level, right leg, initial encounter: Secondary | ICD-10-CM | POA: Diagnosis not present

## 2017-06-06 DIAGNOSIS — M79604 Pain in right leg: Secondary | ICD-10-CM | POA: Diagnosis present

## 2017-06-06 NOTE — ED Provider Notes (Signed)
[facility] EMERGENCY DEPARTMENT Provider Note   CSN: [id number] Arrival date & time: 06/06/17  1241     History   Chief Complaint Chief Complaint  Patient presents with  . Leg Pain    HPI Tiffany Becker is a 72 y.o. female.  72 yo F with a chief complaint of right leg pain.  Describes this is sharp and shooting.  Has been off and on for the past week or so.  She noticed a focal area that she thought was swollen and so came for evaluation for DVT.  Her mother has had multiple of these in the past.  She has never had one herself.  Denies trauma.  Denies fevers.  Denies chest pain or shortness of breath.  The history is provided by the patient.  Leg Pain   This is a new problem. The current episode started more than 1 week ago. The problem occurs constantly. The problem has not changed since onset.The pain is present in the right lower leg. The quality of the pain is described as aching and sharp. The pain is at a severity of 4/10. The pain is moderate. She has tried nothing for the symptoms. The treatment provided no relief. There has been no history of extremity trauma.    Past Medical History:  Diagnosis Date  . Abnormal renal ultrasound 04/25/02  . Diabetes mellitus without complication ([place])   . Diverticulosis of colon 08/12/04  . GERD (gastroesophageal reflux disease)    H pylori neg 08/21/02  . Hypertension   . Peptic ulcer disease 03/14/80   EGD  . Primary osteoarthritis of both hips 4/09   Dr Mayer Camel  . Pyelonephritis 11/02  . Vitreous detachment     Patient Active Problem List   Diagnosis Date Noted  . Bereavement 06/28/2016  . Bilateral shoulder pain 03/03/2015  . Osteopenia 10/30/2014  . Estrogen deficiency 10/20/2014  . Trigger finger 10/07/2013  . Cataracts, bilateral 07/26/2013  . Neoplasm of uncertain behavior of skin 07/23/2013  . Preventative health care 07/22/2013  . Essential hypertension, benign 11/03/2009  . Osteoarthritis of both knees  11/03/2009  . OSTEOARTHRITIS 08/09/2007  . HALLUX RIGIDUS, ACQUIRED 12/26/2006  . Diabetes mellitus without complication ([place]) 26/71/2458  . Dyslipidemia 05/11/2006  . OBESITY, NOS 05/11/2006  . DDD (degenerative disc disease), cervical 05/11/2006    Past Surgical History:  Procedure Laterality Date  . BREAST REDUCTION SURGERY  03/14/93  . BUNIONECTOMY  12/09   L with 2nd and 5th toe straightening  . CERVICAL DISCECTOMY  03/14/06   C6-7  . CHOLECYSTECTOMY  01/12/89  . 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 SALPINGOOPHORECTOMY  09/11/97   ruptured cyst  . TONSILLECTOMY  03/14/52  . TOTAL ABDOMINAL HYSTERECTOMY  01/12/89   L salpingoophorectomy for ruptured cyst  . TUBAL LIGATION  03/14/72  . UMBILICAL HERNIA REPAIR  03/14/64     OB History   None      Home Medications    Prior to Admission medications   Medication Sig Start Date End Date Taking? Authorizing Provider  ALPRAZolam Duanne Moron) 1 MG tablet Take 1 tablet (1 mg total) by mouth 3 (three) times daily as needed for anxiety. Patient not taking: Reported on 12/23/2016 06/28/16   Lupita Dawn, MD  aspirin 81 MG EC tablet Take 1 tablet (81 mg total) by mouth daily. Swallow whole. Patient not taking: Reported on 12/23/2016 07/11/12   Candelaria Celeste, MD  atorvastatin (LIPITOR) 40 MG tablet Take 1 tablet (  40 mg total) by mouth daily. 04/07/17   Leeanne Rio, MD  celecoxib (CELEBREX) 100 MG capsule TAKE ONE CAPSULE BY MOUTH ONCE DAILY 06/17/16   Lupita Dawn, MD  diclofenac sodium (VOLTAREN) 1 % GEL Apply to affected joints 4 times per day. 02/18/16   Lupita Dawn, MD  lisinopril (PRINIVIL,ZESTRIL) 40 MG tablet TAKE 1 TABLET BY MOUTH  DAILY 05/31/17   Leeanne Rio, MD  meloxicam (MOBIC) 7.5 MG tablet TAKE ONE TABLET BY MOUTH ONCE DAILY Patient not taking: Reported on 12/23/2016 07/28/15   Lupita Dawn, MD  metFORMIN (GLUCOPHAGE) 1000 MG tablet TAKE 1 TABLET BY MOUTH TWO  TIMES DAILY WITH A MEAL 05/31/17   Leeanne Rio, MD   mirtazapine (REMERON) 7.5 MG tablet Take 1 tablet (7.5 mg total) by mouth at bedtime. 12/23/16 01/22/17  Rory Percy, DO    Family History Family History  Problem Relation Age of Onset  . Coronary artery disease Father 61       died age 99  . Heart disease Father   . Stroke Father   . Osteoarthritis Mother   . Asthma Mother   . Heart disease Mother   . Diabetes Brother        bladder, pacemaker, Hemochromatosis  . Cancer Brother 34       Bladder    Social History Social History   Tobacco Use  . Smoking status: Never Smoker  . Smokeless tobacco: Never Used  Substance Use Topics  . Alcohol use: No    Alcohol/week: 0.5 oz    Types: 1 Standard drinks or equivalent per week  . Drug use: No     Allergies   Simvastatin   Review of Systems Review of Systems  Constitutional: Negative for chills and fever.  HENT: Negative for congestion and rhinorrhea.   Eyes: Negative for redness and visual disturbance.  Respiratory: Negative for shortness of breath and wheezing.   Cardiovascular: Negative for chest pain and palpitations.  Gastrointestinal: Negative for nausea and vomiting.  Genitourinary: Negative for dysuria and urgency.  Musculoskeletal: Positive for myalgias. Negative for arthralgias.  Skin: Negative for pallor and wound.  Neurological: Negative for dizziness and headaches.     Physical Exam Updated Vital Signs BP (!) 158/78 (BP Location: Right Arm)   Pulse 70   Temp 98.2 F (36.8 C) (Oral)   Resp 16   Ht 5\' 9"  (1.753 m)   Wt 88.5 kg (195 lb)   SpO2 97%   BMI 28.80 kg/m   Physical Exam  Constitutional: She is oriented to person, place, and time. She appears well-developed and well-nourished. No distress.  HENT:  Head: Normocephalic and atraumatic.  Eyes: Pupils are equal, round, and reactive to light. EOM are normal.  Neck: Normal range of motion. Neck supple.  Cardiovascular: Normal rate and regular rhythm. Exam reveals no gallop and no  friction rub.  No murmur heard. Pulmonary/Chest: Effort normal. She has no wheezes. She has no rales.  Abdominal: Soft. She exhibits no distension. There is no tenderness.  Musculoskeletal: She exhibits tenderness. She exhibits no edema.  Mild tenderness to the posterior aspect of the right proximal calf.  She has a small area of darkened skin pigmentation that she describes as tender.  She also has some tenderness to the distal aspect of the hamstring.  No appreciable swelling to the leg.  Pulse motor and sensation is intact distally.  Neurological: She is alert and oriented to person, place, and time.  Skin: Skin is warm and dry. She is not diaphoretic.  Psychiatric: She has a normal mood and affect. Her behavior is normal.  Nursing note and vitals reviewed.    ED Treatments / Results  Labs (all labs ordered are listed, but only abnormal results are displayed) Labs Reviewed - No data to display  EKG None  Radiology US Venous Img Lower Unilateral Right  Result Date: 06/06/2017 CLINICAL DATA:  Right lower extremity pain. History of Baker's cyst. EXAM: RIGHT LOWER EXTREMITY VENOUS DOPPLER ULTRASOUND TECHNIQUE: Gray-scale sonography with graded compression, as well as color Doppler and duplex ultrasound were performed to evaluate the lower extremity deep venous systems from the level of the common femoral vein and including the common femoral, femoral, profunda femoral, popliteal and calf veins including the posterior tibial, peroneal and gastrocnemius veins when visible. The superficial great saphenous vein was also interrogated. Spectral Doppler was utilized to evaluate flow at rest and with distal augmentation maneuvers in the common femoral, femoral and popliteal veins. COMPARISON:  04/11/2014 FINDINGS: Contralateral Common Femoral Vein: Respiratory phasicity is normal and symmetric with the symptomatic side. No evidence of thrombus. Normal compressibility. Common Femoral Vein: No evidence  of thrombus. Normal compressibility, respiratory phasicity and response to augmentation. Saphenofemoral Junction: No evidence of thrombus. Normal compressibility and flow on color Doppler imaging. Profunda Femoral Vein: No evidence of thrombus. Normal compressibility and flow on color Doppler imaging. Femoral Vein: No evidence of thrombus. Normal compressibility, respiratory phasicity and response to augmentation. Popliteal Vein: No evidence of thrombus. Normal compressibility, respiratory phasicity and response to augmentation. Calf Veins: No evidence of thrombus. Normal compressibility and flow on color Doppler imaging. Superficial Great Saphenous Vein: No evidence of thrombus. Normal compressibility. Venous Reflux:  None. Other Findings: Similar-sized popliteal fossa Baker's cyst measures approximately 3 cm in maximum dimensions (previously approximately 2.5 cm) and now contains some internal calcification compared to the prior study. IMPRESSION: 1. No evidence of right lower extremity deep venous thrombosis. 2. Chronic right popliteal fossa Baker's cyst with interval development of some internal calcification since the prior ultrasound study. Electronically Signed   By: Aletta Edouard M.D.   On: 06/06/2017 14:27    Procedures Procedures (including critical care time)  Medications Ordered in ED Medications - No data to display   Initial Impression / Assessment and Plan / ED Course  I have reviewed the triage vital signs and the nursing notes.  Pertinent labs & imaging results that were available during my care of the patient were reviewed by me and considered in my medical decision making (see chart for details).     72 yo F with a chief complaint of right leg pain.  Describes this is sharp and shooting.  Has been off and on for the past week or so.  She noticed a focal area that she thought was swollen and so came for evaluation for DVT.  Her mother has had multiple of these in the past.  She  has never had one herself.  Denies trauma.  Denies fevers.  Denies chest pain or shortness of breath.  Will obtain a DVT study.  DVT negative, old bakers cyst still present.  Clinically sounds more like muscle spasm, PCP follow up.   2:37 PM:  I have discussed the diagnosis/risks/treatment options with the patient and believe the pt to be eligible for discharge home to follow-up with PCP. We also discussed returning to the ED immediately if new or worsening sx occur. We discussed the sx which  are most concerning (e.g., sudden worsening pain, fever, inability to tolerate by mouth) that necessitate immediate return. Medications administered to the patient during their visit and any new prescriptions provided to the patient are listed below.  Medications given during this visit Medications - No data to display   The patient appears reasonably screen and/or stabilized for discharge and I doubt any other medical condition or other [facility] requiring further screening, evaluation, or treatment in the ED at this time prior to discharge.    Final Clinical Impressions(s) / ED Diagnoses   Final diagnoses:  Baker's cyst of knee, right  Muscle strain    ED Discharge Orders    None       Deno Etienne, DO 06/06/17 1437

## 2017-06-06 NOTE — ED Triage Notes (Signed)
Pain in her right leg for over a week. Pain behind her knee. She has a hx of a bakers cyst.

## 2017-06-06 NOTE — Discharge Instructions (Signed)
Take tylenol 1000mg(2 extra strength) four times a day.  ° °

## 2017-06-06 NOTE — Telephone Encounter (Signed)
Pt called nurse line to follow up after being seen in ED for possible DVT. Diagnosed with "huge" bakers cyst. Wanted MD to be aware. Has appointment with Dr. Ardelia Mems 06/13/17. Will forward to MD. Wallace Cullens, RN

## 2017-06-06 NOTE — Telephone Encounter (Signed)
Called pt but spoke with husband, pt went to the ED on 68. Jerauld Bostwick Kennon Holter, CMA

## 2017-06-06 NOTE — Telephone Encounter (Signed)
Pt called requesting appt today (3/26) and we are booked. She thinks she has a blood clot in her leg. Told her to go to urgent care of ED, but pt did not want to do that.  I made her an appt for tomorrow w/[place] at 11:10. She asked that [place] call her back today to discuss what to take for it until tomorrow. Please advise

## 2017-06-07 ENCOUNTER — Ambulatory Visit: Payer: Medicare Other | Admitting: Internal Medicine

## 2017-06-08 ENCOUNTER — Other Ambulatory Visit: Payer: Self-pay

## 2017-06-08 NOTE — Telephone Encounter (Signed)
Noted. Thanks, Leeanne Rio, MD

## 2017-06-12 DIAGNOSIS — E119 Type 2 diabetes mellitus without complications: Secondary | ICD-10-CM | POA: Diagnosis not present

## 2017-06-12 DIAGNOSIS — H40013 Open angle with borderline findings, low risk, bilateral: Secondary | ICD-10-CM | POA: Diagnosis not present

## 2017-06-12 DIAGNOSIS — H2513 Age-related nuclear cataract, bilateral: Secondary | ICD-10-CM | POA: Diagnosis not present

## 2017-06-12 DIAGNOSIS — Z7984 Long term (current) use of oral hypoglycemic drugs: Secondary | ICD-10-CM | POA: Diagnosis not present

## 2017-06-12 LAB — HM DIABETES EYE EXAM

## 2017-06-13 ENCOUNTER — Ambulatory Visit (INDEPENDENT_AMBULATORY_CARE_PROVIDER_SITE_OTHER): Payer: Medicare Other | Admitting: Family Medicine

## 2017-06-13 ENCOUNTER — Encounter: Payer: Self-pay | Admitting: Family Medicine

## 2017-06-13 VITALS — BP 145/70 | HR 63 | Temp 98.1°F | Ht 69.0 in | Wt 196.8 lb

## 2017-06-13 DIAGNOSIS — E785 Hyperlipidemia, unspecified: Secondary | ICD-10-CM

## 2017-06-13 DIAGNOSIS — E119 Type 2 diabetes mellitus without complications: Secondary | ICD-10-CM

## 2017-06-13 DIAGNOSIS — I10 Essential (primary) hypertension: Secondary | ICD-10-CM | POA: Diagnosis not present

## 2017-06-13 LAB — POCT GLYCOSYLATED HEMOGLOBIN (HGB A1C): Hemoglobin A1C: 7.5

## 2017-06-13 MED ORDER — ONETOUCH VERIO W/DEVICE KIT: 1.0000 | PACK | Freq: Once | 0 refills | Status: AC

## 2017-06-13 NOTE — Patient Instructions (Signed)
It was nice to meet you today!  Sent in new meter prescription Follow up with me in 3 months to recheck A1c Keep working on diet and exercise.  Be well, Dr. Ardelia Mems

## 2017-06-13 NOTE — Progress Notes (Signed)
Date of Visit: 06/13/2017   HPI:  Patient presents to meet her new PCP.  Diabetes: Currently taking metformin 1000 mg twice a day.  She was previously also on glyburide but it was stopped because she was having a low sugars down to the 40s.  Had also been on aspirin at one point but she did not tolerate it.  Just had an eye exam yesterday.  Denies chest pain or shortness of breath.  Does not check sugars at home as she needs a new meter.  Hypertension: Taking lisinopril 40 mg daily.  Tolerating this well.  Hyperlipidemia: Currently taking Lipitor 20 mg every other day.  She did not tolerate a higher dose at daily dosing..  As above, denies chest pain or shortness of breath.  Tolerating atorvastatin well on this current regimen.  ROS: See HPI.  [place]: History of diabetes, hyperlipidemia, hypertension, osteopenia, arthritis  PHYSICAL EXAM: BP (!) 145/70 (BP Location: Left Arm, Patient Position: Sitting, Cuff Size: Normal)   Pulse 63   Temp 98.1 F (36.7 C) (Oral)   Ht 5\' 9"  (1.753 m)   Wt 196 lb 12.8 oz (89.3 kg)   SpO2 98%   BMI 29.06 kg/m  Gen: No acute distress, pleasant, cooperative HEENT: Normocephalic, atraumatic Heart: Regular rate and rhythm, no murmur Lungs: Clear bilaterally, normal effort Neuro: Alert, grossly nonfocal, speech normal Ext: No edema  ASSESSMENT/PLAN:  Health maintenance:  -Had diabetic eye exam yesterday.  Will await records. -colon cancer screening - prefers FIT testing to colonoscopy, current on FIT testing  Diabetes mellitus without complication (HCC) G6Y 7.5.  Reasonable control given patient's age.  Discussion with patient today about whether to restart glyburide, as she really wanted to restart it.  I advised I am okay with an A1c of 7.5 especially she had hypoglycemic events on the glyburide.  She will work on exercise and diet.  Follow-up in 3 months to recheck A1c.  I did send in a new meter for her.  Dyslipidemia Plan to recheck lipids at  next visit as it has not been quite a year since her last lipid check.  Follow-up in 3 months.  Essential hypertension, benign Well-controlled, continue current regimen.  FOLLOW UP: Follow up in 3 months for chronic medical problems.  [place]. Ardelia Mems, [place]

## 2017-06-16 NOTE — Assessment & Plan Note (Signed)
Plan to recheck lipids at next visit as it has not been quite a year since her last lipid check.  Follow-up in 3 months.

## 2017-06-16 NOTE — Assessment & Plan Note (Signed)
Well-controlled, continue current regimen 

## 2017-06-16 NOTE — Assessment & Plan Note (Addendum)
A1c 7.5.  Reasonable control given patient's age.  Discussion with patient today about whether to restart glyburide, as she really wanted to restart it.  I advised I am okay with an A1c of 7.5 especially she had hypoglycemic events on the glyburide.  She will work on exercise and diet.  Follow-up in 3 months to recheck A1c.  I did send in a new meter for her.

## 2017-08-08 DIAGNOSIS — X32XXXA Exposure to sunlight, initial encounter: Secondary | ICD-10-CM | POA: Diagnosis not present

## 2017-08-08 DIAGNOSIS — L255 Unspecified contact dermatitis due to plants, except food: Secondary | ICD-10-CM | POA: Diagnosis not present

## 2017-08-08 DIAGNOSIS — Z1283 Encounter for screening for malignant neoplasm of skin: Secondary | ICD-10-CM | POA: Diagnosis not present

## 2017-08-08 DIAGNOSIS — D225 Melanocytic nevi of trunk: Secondary | ICD-10-CM | POA: Diagnosis not present

## 2017-08-08 DIAGNOSIS — L570 Actinic keratosis: Secondary | ICD-10-CM | POA: Diagnosis not present

## 2017-09-21 ENCOUNTER — Other Ambulatory Visit: Payer: Self-pay | Admitting: Family Medicine

## 2017-09-21 NOTE — Telephone Encounter (Signed)
Please remind patient to schedule a follow up appointment Tiffany Rio, MD

## 2017-09-21 NOTE — Telephone Encounter (Signed)
LMOVM for pt to call back and schedule follow up appt. Josceline Chenard Kennon Holter, CMA

## 2017-09-29 ENCOUNTER — Other Ambulatory Visit: Payer: Self-pay

## 2017-09-29 ENCOUNTER — Encounter: Payer: Self-pay | Admitting: Family Medicine

## 2017-09-29 ENCOUNTER — Ambulatory Visit (INDEPENDENT_AMBULATORY_CARE_PROVIDER_SITE_OTHER): Payer: Medicare Other | Admitting: Family Medicine

## 2017-09-29 VITALS — BP 164/80 | HR 62 | Temp 97.7°F | Ht 69.0 in | Wt 201.0 lb

## 2017-09-29 DIAGNOSIS — E119 Type 2 diabetes mellitus without complications: Secondary | ICD-10-CM

## 2017-09-29 DIAGNOSIS — E785 Hyperlipidemia, unspecified: Secondary | ICD-10-CM

## 2017-09-29 DIAGNOSIS — Z1211 Encounter for screening for malignant neoplasm of colon: Secondary | ICD-10-CM | POA: Diagnosis not present

## 2017-09-29 DIAGNOSIS — I10 Essential (primary) hypertension: Secondary | ICD-10-CM | POA: Diagnosis not present

## 2017-09-29 LAB — POCT GLYCOSYLATED HEMOGLOBIN (HGB A1C): HbA1c, POC (controlled diabetic range): 7.7 % — AB (ref 0.0–7.0)

## 2017-09-29 MED ORDER — GLYBURIDE 2.5 MG PO TABS: 2.5000 mg | ORAL_TABLET | Freq: Every day | ORAL | 1 refills | Status: DC

## 2017-09-29 MED ORDER — GLUCOSE BLOOD VI STRP: ORAL_STRIP | 12 refills | Status: DC

## 2017-09-29 MED ORDER — AMLODIPINE BESYLATE 5 MG PO TABS: 5.0000 mg | ORAL_TABLET | Freq: Every day | ORAL | 3 refills | Status: DC

## 2017-09-29 NOTE — Patient Instructions (Signed)
Adding gyburide 2.5mg  daily Also amlodipine 5mg  daily  On your way out, schedule an appointment one morning to come back for fasting labs. Do not eat or drink anything other than water the morning of your lab appointment until after your labs are drawn.  Also nurse blood pressure check  Bring back stool cards to [place]  Follow up with me in 4 months.  Be well, Dr. Ardelia Mems

## 2017-09-29 NOTE — Progress Notes (Signed)
Date of Visit: 09/29/2017   HPI:  Patient presents for follow up of diabetes.  Diabetes - currently taking metformin 1000mg  twice daily. Needs new strips for her glucometer. Due for foot exam today. Had eye exam at [facility] in April. Gets occasional diarrhea from metformin, otherwise tolerating well. No chest pain or shortness of breath. 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 Patient was summoned for jury duty and would like a letter saying she needs to be home with her husband.  Hypertension - currently takin glisinopril 40mg  daily.   Hyperlipidemia - taking atorvastatin 20mg  every Mon, Wed, and Fri. Tolerating this well. Not fasting today, agreeable to returning for fasting labs.  Celebrex refill - takes celebrex for finger arthritis/trigger finger. Needs refill on this today. Tolerating well.  ROS: See HPI.  [place]: history of diabetes, hyperlipidemia, hypertension, osteopenia  PHYSICAL EXAM: BP (!) 164/80   Pulse 62   Temp 97.7 F (36.5 C) (Oral)   Ht 5\' 9"  (1.753 m)   Wt 201 lb (91.2 kg)   SpO2 97%   BMI 29.68 kg/m  Gen: NAD, pleasant, cooperative HEENT: NCAT Heart: RRR, no murmurs Lungs: CTAB, NWOB Neuro: grossly nonfocal, speech normal, follows commands, alert and oriented  Diabetic foot exam: 2+ DP pulses bilat, normal monofilament testing bilaterally. No lesions or significant calluses.   ASSESSMENT/PLAN:  Health maintenance:  -foot exam done today -due for FIT testing (patient declines future colonoscopies for screening purposes), given stool cards to complete and return to [place]  Diabetes mellitus without complication ([place]) W8S up to 7.7 from 7.5. Patient prefers to restart glyburide 2.5mg  daily. rx sent in. She will monitor for hypoglycemia.  Cardiac: on statin, check lipids at future lab  visit Renal: on ACE Eye: UTD Foot: UTD Immunizations: UTD   Essential hypertension, benign Blood pressure elevated today on max dose lisinopril. Add amlodipine 5mg  daily. Nurse BP check in 1 week.  Dyslipidemia Check lipids at future lab appointment as nonfasting today.  Jury duty letter written  Celebrex refilled  FOLLOW UP: Follow up in 4 mos for above issues  [place] J. Ardelia Mems, [place]

## 2017-10-02 NOTE — Assessment & Plan Note (Signed)
Check lipids at future lab appointment as nonfasting today.

## 2017-10-02 NOTE — Assessment & Plan Note (Signed)
Blood pressure elevated today on max dose lisinopril. Add amlodipine 5mg  daily. Nurse BP check in 1 week.

## 2017-10-02 NOTE — Assessment & Plan Note (Signed)
a1c up to 7.7 from 7.5. Patient prefers to restart glyburide 2.5mg  daily. rx sent in. She will monitor for hypoglycemia.  Cardiac: on statin, check lipids at future lab visit Renal: on ACE Eye: UTD Foot: UTD Immunizations: UTD

## 2017-11-23 DIAGNOSIS — Z1211 Encounter for screening for malignant neoplasm of colon: Secondary | ICD-10-CM | POA: Diagnosis not present

## 2017-11-24 LAB — GLUCOSE, POCT (MANUAL RESULT ENTRY): POC Glucose: 140 mg/dl — AB (ref 70–99)

## 2017-11-24 NOTE — Addendum Note (Signed)
Addended by: Concepcion Elk on: 11/24/2017 01:54 PM   Modules accepted: Orders

## 2017-11-24 NOTE — Addendum Note (Signed)
Addended by: Londell Moh T on: 11/24/2017 08:40 AM   Modules accepted: Orders

## 2017-11-26 LAB — FECAL OCCULT BLOOD, IMMUNOCHEMICAL: Fecal Occult Bld: NEGATIVE

## 2017-11-28 ENCOUNTER — Other Ambulatory Visit: Payer: Self-pay | Admitting: Family Medicine

## 2017-12-06 ENCOUNTER — Ambulatory Visit (INDEPENDENT_AMBULATORY_CARE_PROVIDER_SITE_OTHER): Payer: Medicare Other

## 2017-12-06 DIAGNOSIS — Z23 Encounter for immunization: Secondary | ICD-10-CM

## 2017-12-29 DIAGNOSIS — Z1231 Encounter for screening mammogram for malignant neoplasm of breast: Secondary | ICD-10-CM | POA: Diagnosis not present

## 2018-01-12 ENCOUNTER — Other Ambulatory Visit: Payer: Self-pay

## 2018-01-12 NOTE — Patient Outreach (Signed)
[facility]) Care Management  01/12/2018  Tiffany Becker Nov 08, 1945 [id number]   Medication adherence call to Tiffany Becker spoke with she pick up Metformin 1000 mg from the pharmacy and has enough for three more month Tiffany Becker is showing past due under [place].   [place] Management Direct Dial [phone]  Fax [phone] [email or web address]

## 2018-01-16 ENCOUNTER — Ambulatory Visit: Payer: Medicare Other | Admitting: Cardiovascular Disease

## 2018-02-06 ENCOUNTER — Encounter: Payer: Self-pay | Admitting: Family Medicine

## 2018-02-28 ENCOUNTER — Other Ambulatory Visit: Payer: Self-pay | Admitting: Family Medicine

## 2018-02-28 NOTE — Telephone Encounter (Signed)
Please remind patient to schedule follow up with me, thanks! Maressa Apollo J Yarieliz Wasser, MD  

## 2018-03-01 NOTE — Telephone Encounter (Signed)
Attempted to call patient to schedule an appointment. Phone was picked up but then hung up.   If patient happens to call back, she needs to make an appointment with Dr. Ardelia Mems.  Ozella Almond, [place]

## 2018-03-02 NOTE — Telephone Encounter (Signed)
Spoke to patient and she says that she wants to get through the holidays first and then she will make and appointment in January.  Tiffany Becker, [place]

## 2018-03-27 ENCOUNTER — Encounter: Payer: Medicare Other | Admitting: Family Medicine

## 2018-04-23 ENCOUNTER — Ambulatory Visit (INDEPENDENT_AMBULATORY_CARE_PROVIDER_SITE_OTHER): Payer: Medicare Other

## 2018-04-23 VITALS — BP 144/72 | HR 67 | Temp 98.3°F | Ht 69.0 in | Wt 204.6 lb

## 2018-04-23 DIAGNOSIS — Z0000 Encounter for general adult medical examination without abnormal findings: Secondary | ICD-10-CM | POA: Diagnosis not present

## 2018-04-23 NOTE — Progress Notes (Signed)
I have reviewed this visit and agree with the documentation.  ?Fredrika Canby J Leandro Berkowitz, MD  ?

## 2018-04-23 NOTE — Progress Notes (Signed)
Subjective:   Tiffany Becker is a 73 y.o. female who presents for Medicare Annual (Subsequent) preventive examination.  Review of Systems:  Physical assessment deferred to PCP.  Cardiac Risk Factors include: advanced age (>24men, >85 women);hypertension     Objective:     Vitals: BP (!) 144/72   Pulse 67   Temp 98.3 F (36.8 C) (Oral)   Ht 5\' 9"  (1.753 m)   Wt 204 lb 9.6 oz (92.8 kg)   SpO2 97%   BMI 30.21 kg/m   Body mass index is 30.21 kg/m.  Advanced Directives 04/23/2018 09/29/2017 06/13/2017 01/23/2017 12/23/2016 07/29/2016 07/22/2016  Does Patient Have a Medical Advance Directive? Yes No No - Yes No No  Type of Paramedic of [place];Living will - - [place];Living will [place];Living will - -  Copy of [place] in Chart? No - copy requested - - No - copy requested No - copy requested - -  Would patient like information on creating a medical advance directive? - No - Patient declined No - Patient declined No - Patient declined No - Patient declined No - Patient declined No - Patient declined    Tobacco Social History   Tobacco Use  Smoking Status Never Smoker  Smokeless Tobacco Never Used     Counseling given: No   Clinical Intake:  Pre-visit preparation completed: Yes  Pain : No/denies pain Pain Score: 0-No pain     Nutritional Status: BMI 25 -29 Overweight Diabetes: Yes CBG done?: No Did pt. bring in CBG monitor from home?: No  What is the last grade level you completed in school?: some college  Interpreter Needed?: No     Past Medical History:  Diagnosis Date  . Abnormal renal ultrasound 04/25/02  . Diabetes mellitus without complication ([place])   . Diverticulosis of colon 08/12/04  . GERD (gastroesophageal reflux disease)    H pylori neg 08/21/02  . Hypertension   . Peptic ulcer disease 03/14/80   EGD  . Primary osteoarthritis of both hips 4/09   Dr Mayer Camel  .  Pyelonephritis 11/02  . Vitreous detachment    Past Surgical History:  Procedure Laterality Date  . BREAST REDUCTION SURGERY  03/14/93  . BUNIONECTOMY  12/09   L with 2nd and 5th toe straightening  . CERVICAL DISCECTOMY  03/14/06   C6-7  . CHOLECYSTECTOMY  01/12/89  . 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 SALPINGOOPHORECTOMY  09/11/97   ruptured cyst  . TONSILLECTOMY  03/14/52  . TOTAL ABDOMINAL HYSTERECTOMY  01/12/89   L salpingoophorectomy for ruptured cyst  . TUBAL LIGATION  03/14/72  . UMBILICAL HERNIA REPAIR  03/14/64   Family History  Problem Relation Age of Onset  . Coronary artery disease Father 13       died age 26  . Heart disease Father   . Stroke Father   . Osteoarthritis Mother   . Asthma Mother   . Heart disease Mother   . Diabetes Brother        bladder, pacemaker, Hemochromatosis  . Cancer Brother 53       Bladder   Social History   Socioeconomic History  . Marital status: Married    Spouse name: Jenny Reichmann  . Number of children: 2  . Years of education: 62  . Highest education level: 12th grade  Occupational History  . Occupation: Retired-Sales of store fixtures  Social Needs  . Financial resource strain:  Not hard at all  . Food insecurity:    Worry: Never true    Inability: Never true  . Transportation needs:    Medical: No    Non-medical: No  Tobacco Use  . Smoking status: Never Smoker  . Smokeless tobacco: Never Used  Substance and Sexual Activity  . Alcohol use: No  . Drug use: No  . Sexual activity: Not Currently  Lifestyle  . Physical activity:    Days per week: 3 days    Minutes per session: 30 min  . Stress: Only a little  Relationships  . Social connections:    Talks on phone: More than three times a week    Gets together: More than three times a week    Attends religious service: More than 4 times per year    Active member of club or organization: Yes    Attends meetings of clubs or organizations: Never    Relationship status: Married    Other Topics Concern  . Not on file  Social History Narrative   Retired- Financial controller with 2 levels, 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 Smoke detectors, no tripping hazards.Has grab bars in bathroom.   2 daughters in the area; [place] and [place]   Emergency Contact: husband, Jenny Reichmann [phone]   End of Life Plan: pt has advance directives but does not have in chart, requested copy from family.   Who lives with you: husband    Any pets: dog   Diet: Pt has a varied diet of protein, starch and vegetables.   Exercise: Pt reports walking dog daily, yard work, church activities.   Seatbelts: Pt reports wearing seatbelt when in vehicles.    Nancy Fetter Exposure/Protection: Pt reports using sunscreen regularly.    Hobbies: church, walking, yard work, cooking          Outpatient Encounter Medications as of 04/23/2018  Medication Sig  . amLODipine (NORVASC) 5 MG tablet Take 1 tablet (5 mg total) by mouth daily.  Marland Kitchen atorvastatin (LIPITOR) 40 MG tablet Take 1 tablet (40 mg total) by mouth daily. (Patient taking differently: Take 20 mg by mouth every Monday, Wednesday, and Friday. )  . B Complex-Folic Acid (B COMPLEX-VITAMIN B12 PO) Take by mouth daily.  . celecoxib (CELEBREX) 100 MG capsule TAKE ONE CAPSULE BY MOUTH ONCE DAILY (Patient taking differently: TAKE ONE CAPSULE BY MOUTH ONCE DAILY as needed)  . glucose blood (ONETOUCH VERIO) test strip Check sugar once per day  . glyBURIDE (DIABETA) 2.5 MG tablet Take 1 tablet (2.5 mg total) by mouth daily with breakfast. (Patient taking differently: Take 2.5 mg by mouth daily with breakfast. Takes one-half tablet daily.)  . lisinopril (PRINIVIL,ZESTRIL) 40 MG tablet TAKE 1 TABLET BY MOUTH  DAILY  . metFORMIN (GLUCOPHAGE) 1000 MG tablet TAKE 1 TABLET BY MOUTH 2  TIMES DAILY WITH A MEAL   No facility-administered encounter medications on file as of 04/23/2018.     Activities of Daily Living In your present state of health, do you have any difficulty performing  the following activities: 04/23/2018  Hearing? N  Vision? N  Comment has a cataract starting in left eye  Difficulty concentrating or making decisions? N  Walking or climbing stairs? N  Dressing or bathing? N  Doing errands, shopping? N  Preparing Food and eating ? N  Using the Toilet? N  In the past six months, have you accidently leaked urine? N  Do you have problems with loss of bowel control? N  Managing your Medications? N  Managing your Finances? N  Housekeeping or managing your Housekeeping? N  Some recent data might be hidden    Patient Care Team: Leeanne Rio, MD as PCP - General (Family Medicine) Marilynne Halsted, MD (Ophthalmology) Garrel Ridgel, DPM as Consulting Physician (Podiatry)    Assessment:   This is a routine wellness examination for [place].  Exercise Activities and Dietary recommendations Current Exercise Habits: The patient does not participate in regular exercise at present  Goals      Patient Stated   . Weight (lb) < 195 lb (88.5 kg) (pt-stated)     7% weight loss       Other   . Blood Pressure < 150/90    . Patient Stated     Maintain health to be able to give care to husband.       Fall Risk Fall Risk  04/23/2018 09/29/2017 06/13/2017 12/23/2016 07/29/2016  Falls in the past year? 0 No No No No   Is the patient's home free of loose throw rugs in walkways, pet beds, electrical cords, etc?   yes      Grab bars in the bathroom? yes      Handrails on the stairs?   yes      Adequate lighting?   yes   Depression Screen PHQ 2/9 Scores 04/23/2018 09/29/2017 06/13/2017 01/23/2017  PHQ - 2 Score 0 0 0 0  PHQ- 9 Score - - - 1     Cognitive Function MMSE - Mini Mental State Exam 04/23/2018 07/02/2013  Orientation to time 5 5  Orientation to Place 5 5  Registration 3 3  Attention/ Calculation 5 5  Recall 3 3  Language- name 2 objects 2 2  Language- repeat 1 1  Language- follow 3 step command 3 3  Language- read & follow direction 1 1   Write a sentence 1 1  Copy design 1 1  Total score 30 30     6CIT Screen 04/23/2018  What Year? 0 points  What month? 0 points  What time? 0 points  Count back from 20 0 points  Months in reverse 0 points  Repeat phrase 0 points  Total Score 0    Immunization History  Administered Date(s) Administered  . H1N1 02/25/2008  . Influenza Split 12/06/2011  . Influenza Whole 12/18/2006, 12/14/2007, 01/06/2009  . Influenza,inj,Quad PF,6+ Mos 11/27/2012, 12/09/2013, 11/21/2014, 11/27/2015, 12/23/2016, 12/06/2017  . Pneumococcal Conjugate-13 07/02/2013  . Pneumococcal Polysaccharide-23 02/11/2001, 10/28/2014  . Td 05/12/2004  . Tdap 11/04/2014  . Zoster 08/09/2007     Screening Tests Health Maintenance  Topic Date Due  . HEMOGLOBIN A1C  04/01/2018  . COLONOSCOPY  04/24/2019 (Originally 08/13/2014)  . OPHTHALMOLOGY EXAM  06/13/2018  . FOOT EXAM  09/30/2018  . COLON CANCER SCREENING ANNUAL FOBT  11/24/2018  . MAMMOGRAM  12/30/2019  . TETANUS/TDAP  11/03/2024  . INFLUENZA VACCINE  Completed  . DEXA SCAN  Completed  . Hepatitis C Screening  Completed  . PNA vac Low Risk Adult  Completed    Cancer Screenings: Lung: Low Dose CT Chest recommended if Age 72-80 years, 30 pack-year currently smoking OR have quit w/in 15years. Patient does not qualify. Breast:  Up to date on Mammogram? Yes   Up to date of Bone Density/Dexa? Yes Colorectal: FOBT up to date  Additional Screenings:  Hepatitis C Screening: complete     Plan:  All health maintenance up to date except A1c. Patient is overdue  for appointment with PCP. Appointment made for 04/26/18 for DM, HTN f/u. Patient will be fasting so lipid profile can be drawn.   I have personally reviewed and noted the following in the patient's chart:   . Medical and social history . Use of alcohol, tobacco or illicit drugs  . Current medications and supplements . Functional ability and status . Nutritional status . Physical  activity . Advanced directives . List of other physicians . Hospitalizations, surgeries, and ER visits in previous 12 months . Vitals . Screenings to include cognitive, depression, and falls . Referrals and appointments  In addition, I have reviewed and discussed with patient certain preventive protocols, quality metrics, and best practice recommendations. A written personalized care plan for preventive services as well as general preventive health recommendations were provided to patient.     Esau Grew, RN  04/23/2018

## 2018-04-23 NOTE — Patient Instructions (Addendum)
Tiffany Becker , Thank you for taking time to come for your Medicare Wellness Visit. I appreciate your ongoing commitment to your health goals. Please review the following plan we discussed and let me know if I can assist you in the future.   Please come fasting to your appointment with Dr. Ardelia Mems on 04/26/18.  These are the goals we discussed: Goals      Patient Stated   . Weight (lb) < 195 lb (88.5 kg) (pt-stated)     7% weight loss       Other   . Blood Pressure < 150/90    . Patient Stated     Maintain health to be able to give care to husband.       This is a list of the screening recommended for you and due dates:  Health Maintenance  Topic Date Due  . Hemoglobin A1C  04/01/2018  . Colon Cancer Screening  04/24/2019*  . Eye exam for diabetics  06/13/2018  . Complete foot exam   09/30/2018  . Stool Blood Test  11/24/2018  . Mammogram  12/30/2019  . Tetanus Vaccine  11/03/2024  . Flu Shot  Completed  . DEXA scan (bone density measurement)  Completed  .  Hepatitis C: One time screening is recommended by Center for Disease Control  (CDC) for  adults born from [birth year] through 1965.   Completed  . Pneumonia vaccines  Completed  *Topic was postponed. The date shown is not the original due date.     Fall Prevention in the Home, Adult Falls can cause injuries. They can happen to people of all ages. There are many things you can do to make your home safe and to help prevent falls. Ask for help when making these changes, if needed. What actions can I take to prevent falls? General Instructions  Use good lighting in all rooms. Replace any light bulbs that burn out.  Turn on the lights when you go into a dark area. Use night-lights.  Keep items that you use often in easy-to-reach places. Lower the shelves around your home if necessary.  Set up your furniture so you have a clear path. Avoid moving your furniture around.  Do not have throw rugs and other things on the floor  that can make you trip.  Avoid walking on wet floors.  If any of your floors are uneven, fix them.  Add color or contrast paint or tape to clearly mark and help you see: ? Any grab bars or handrails. ? First and last steps of stairways. ? Where the edge of each step is.  If you use a stepladder: ? Make sure that it is fully opened. Do not climb a closed stepladder. ? Make sure that both sides of the stepladder are locked into place. ? Ask someone to hold the stepladder for you while you use it.  If there are any pets around you, be aware of where they are. What can I do in the bathroom?      Keep the floor dry. Clean up any water that spills onto the floor as soon as it happens.  Remove soap buildup in the tub or shower regularly.  Use non-skid mats or decals on the floor of the tub or shower.  Attach bath mats securely with double-sided, non-slip rug tape.  If you need to sit down in the shower, use a plastic, non-slip stool.  Install grab bars by the toilet and in the tub and shower.  Do not use towel bars as grab bars. What can I do in the bedroom?  Make sure that you have a light by your bed that is easy to reach.  Do not use any sheets or blankets that are too big for your bed. They should not hang down onto the floor.  Have a firm chair that has side arms. You can use this for support while you get dressed. What can I do in the kitchen?  Clean up any spills right away.  If you need to reach something above you, use a strong step stool that has a grab bar.  Keep electrical cords out of the way.  Do not use floor polish or wax that makes floors slippery. If you must use wax, use non-skid floor wax. What can I do with my stairs?  Do not leave any items on the stairs.  Make sure that you have a light switch at the top of the stairs and the bottom of the stairs. If you do not have them, ask someone to add them for you.  Make sure that there are handrails on both  sides of the stairs, and use them. Fix handrails that are broken or loose. Make sure that handrails are as long as the stairways.  Install non-slip stair treads on all stairs in your home.  Avoid having throw rugs at the top or bottom of the stairs. If you do have throw rugs, attach them to the floor with carpet tape.  Choose a carpet that does not hide the edge of the steps on the stairway.  Check any carpeting to make sure that it is firmly attached to the stairs. Fix any carpet that is loose or worn. What can I do on the outside of my home?  Use bright outdoor lighting.  Regularly fix the edges of walkways and driveways and fix any cracks.  Remove anything that might make you trip as you walk through a door, such as a raised step or threshold.  Trim any bushes or trees on the path to your home.  Regularly check to see if handrails are loose or broken. Make sure that both sides of any steps have handrails.  Install guardrails along the edges of any raised decks and porches.  Clear walking paths of anything that might make someone trip, such as tools or rocks.  Have any leaves, snow, or ice cleared regularly.  Use sand or salt on walking paths during winter.  Clean up any spills in your garage right away. This includes grease or oil spills. What other actions can I take?  Wear shoes that: ? Have a low heel. Do not wear high heels. ? Have rubber bottoms. ? Are comfortable and fit you well. ? Are closed at the toe. Do not wear open-toe sandals.  Use tools that help you move around (mobility aids) if they are needed. These include: ? Canes. ? Walkers. ? Scooters. ? Crutches.  Review your medicines with your doctor. Some medicines can make you feel dizzy. This can increase your chance of falling. Ask your doctor what other things you can do to help prevent falls. Where to find more information  Centers for Disease Control and Prevention, STEADI:  https://garcia.biz/  [employer] on Aging: BrainJudge.co.uk Contact a doctor if:  You are afraid of falling at home.  You feel weak, drowsy, or dizzy at home.  You fall at home. Summary  There are many simple things that you can do to  make your home safe and to help prevent falls.  Ways to make your home safe include removing tripping hazards and installing grab bars in the bathroom.  Ask for help when making these changes in your home. This information is not intended to replace advice given to you by your health care provider. Make sure you discuss any questions you have with your health care provider. Document Released: 12/25/2008 Document Revised: 10/13/2016 Document Reviewed: 10/13/2016 Elsevier Interactive Patient Education  2019 [employer].

## 2018-04-26 ENCOUNTER — Ambulatory Visit (INDEPENDENT_AMBULATORY_CARE_PROVIDER_SITE_OTHER): Payer: Medicare Other | Admitting: Family Medicine

## 2018-04-26 ENCOUNTER — Encounter: Payer: Self-pay | Admitting: Family Medicine

## 2018-04-26 VITALS — BP 130/70 | HR 68 | Temp 98.2°F | Wt 203.6 lb

## 2018-04-26 DIAGNOSIS — E785 Hyperlipidemia, unspecified: Secondary | ICD-10-CM

## 2018-04-26 DIAGNOSIS — I10 Essential (primary) hypertension: Secondary | ICD-10-CM

## 2018-04-26 DIAGNOSIS — E119 Type 2 diabetes mellitus without complications: Secondary | ICD-10-CM

## 2018-04-26 DIAGNOSIS — M8580 Other specified disorders of bone density and structure, unspecified site: Secondary | ICD-10-CM | POA: Diagnosis not present

## 2018-04-26 LAB — POCT GLYCOSYLATED HEMOGLOBIN (HGB A1C): HbA1c, POC (controlled diabetic range): 6.5 % (ref 0.0–7.0)

## 2018-04-26 NOTE — Patient Instructions (Signed)
It was great to see you again today!  Stay on current medication Checking labs today  Follow up in 6 months, sooner if needed  Be well, Dr. Ardelia Mems

## 2018-04-26 NOTE — Assessment & Plan Note (Signed)
Well-controlled.  Continue current regimen. 

## 2018-04-26 NOTE — Assessment & Plan Note (Signed)
Excellent control with A1c of 6.5. continue current medications.  Follow up in 6 months.

## 2018-04-26 NOTE — Assessment & Plan Note (Signed)
Check lipids & CMET today to monitor hyperlipidemia

## 2018-04-26 NOTE — Progress Notes (Signed)
Date of Visit: 04/26/2018   HPI:  Patient presents for routine follow up.  Diabetes - taking glyburide 2.5mg  daily and metformin 1000mg  twice daily. No low sugars. Tolerating these medications well. No chest pain or shortness of breath.  Hypertension - taking amlodipine 5mg  daily and lisinopril 40mg  daily. Tolerating well.  Hyperlipidemia - takes atorvastatin 40mg  every M,W,F. Cannot take more often due to pain in her legs but can tolerate it like this 3 times a week.  Osteopenia - not taking calcium or vitamin D, she thinks she may not have tolerated vit D in the past. Agreeable to having lab checked today.  ROS: See HPI.  [place]: history of type 2 diabetes, hyperlipidemia, hypertension, obesity, OA, osteopenia  PHYSICAL EXAM: BP 130/70   Pulse 68   Temp 98.2 F (36.8 C) (Oral)   Wt 203 lb 9.6 oz (92.4 kg)   SpO2 97%   BMI 30.07 kg/m  Gen: no acute distress, pleasant, cooperative, well appearing HEENT: normocephalic, atraumatic, moist mucous membranes  Heart: regular rate and rhythm, no murmur Lungs: clear to auscultation bilaterally, normal work of breathing  Neuro: alert, grossly nonfocal, speech normal Ext: No appreciable lower extremity edema bilaterally   ASSESSMENT/PLAN:  Health maintenance:  -has upcoming [facility] exam at [facility] in March -UTD on HM items  Diabetes mellitus without complication ([place]) Excellent control with A1c of 6.5. continue current medications.  Follow up in 6 months.   Dyslipidemia Check lipids & CMET today to monitor hyperlipidemia   Essential hypertension, benign Well controlled. Continue current regimen.   Osteopenia Check vitamin D level today, will likely recommend supplementation pending this level.  FOLLOW UP: Follow up in 6 months for routine medical problems, sooner if needed.  [place]. Ardelia Mems, [place]

## 2018-04-26 NOTE — Assessment & Plan Note (Signed)
Check vitamin D level today, will likely recommend supplementation pending this level.

## 2018-04-27 ENCOUNTER — Telehealth: Payer: Self-pay | Admitting: Family Medicine

## 2018-04-27 DIAGNOSIS — E559 Vitamin D deficiency, unspecified: Secondary | ICD-10-CM | POA: Insufficient documentation

## 2018-04-27 LAB — CMP14+EGFR
ALBUMIN: 4.3 g/dL (ref 3.7–4.7)
ALK PHOS: 83 IU/L (ref 39–117)
ALT: 16 IU/L (ref 0–32)
AST: 15 IU/L (ref 0–40)
Albumin/Globulin Ratio: 2 (ref 1.2–2.2)
BILIRUBIN TOTAL: 0.2 mg/dL (ref 0.0–1.2)
BUN / CREAT RATIO: 15 (ref 12–28)
BUN: 13 mg/dL (ref 8–27)
CO2: 22 mmol/L (ref 20–29)
CREATININE: 0.85 mg/dL (ref 0.57–1.00)
Calcium: 9.6 mg/dL (ref 8.7–10.3)
Chloride: 98 mmol/L (ref 96–106)
GFR calc Af Amer: 80 mL/min/{1.73_m2} (ref >59)
GFR calc non Af Amer: 69 mL/min/{1.73_m2} (ref >59)
GLOBULIN, TOTAL: 2.1 g/dL (ref 1.5–4.5)
Glucose: 127 mg/dL — ABNORMAL HIGH (ref 65–99)
Potassium: 5.1 mmol/L (ref 3.5–5.2)
SODIUM: 137 mmol/L (ref 134–144)
Total Protein: 6.4 g/dL (ref 6.0–8.5)

## 2018-04-27 LAB — LIPID PANEL
CHOL/HDL RATIO: 2.9 ratio (ref 0.0–4.4)
Cholesterol, Total: 193 mg/dL (ref 100–199)
HDL: 66 mg/dL (ref >39)
LDL Calculated: 110 mg/dL — ABNORMAL HIGH (ref 0–99)
Triglycerides: 86 mg/dL (ref 0–149)
VLDL Cholesterol Cal: 17 mg/dL (ref 5–40)

## 2018-04-27 LAB — VITAMIN D 25 HYDROXY (VIT D DEFICIENCY, FRACTURES): VIT D 25 HYDROXY: 21.8 ng/mL — AB (ref 30.0–100.0)

## 2018-04-27 MED ORDER — CHOLECALCIFEROL 1.25 MG (50000 UT) PO TABS: 1.0000 | ORAL_TABLET | ORAL | 0 refills | Status: DC

## 2018-04-27 NOTE — Telephone Encounter (Signed)
Called patient to discuss low Vit D. Needs repletion. rx 50k units weekly for 12 weeks, then recheck at next visit.  patient agreeable. rx sent in.  Leeanne Rio, MD

## 2018-05-31 ENCOUNTER — Other Ambulatory Visit: Payer: Self-pay | Admitting: Family Medicine

## 2018-07-27 ENCOUNTER — Other Ambulatory Visit: Payer: Self-pay

## 2018-07-27 NOTE — Patient Outreach (Signed)
[facility]) Care Management  07/27/2018  Tiffany Becker [birth year] [id number]   Medication Adherence call to Tiffany Becker [facility] Identifiers Verify spoke with patient she is due on Lisinopril 40 mg patient explain she is still taking 1 tablet daily and has medication for another month and will order it when due. Tiffany Becker is showing past due under [place].   [place] Management Direct Dial [phone]  Fax [phone] [name]

## 2018-09-07 ENCOUNTER — Other Ambulatory Visit: Payer: Self-pay | Admitting: Family Medicine

## 2018-09-20 ENCOUNTER — Other Ambulatory Visit: Payer: Self-pay

## 2018-09-20 ENCOUNTER — Ambulatory Visit (INDEPENDENT_AMBULATORY_CARE_PROVIDER_SITE_OTHER): Payer: Medicare Other | Admitting: Family Medicine

## 2018-09-20 VITALS — BP 148/72 | HR 57

## 2018-09-20 DIAGNOSIS — R300 Dysuria: Secondary | ICD-10-CM | POA: Diagnosis not present

## 2018-09-20 DIAGNOSIS — N390 Urinary tract infection, site not specified: Secondary | ICD-10-CM | POA: Diagnosis not present

## 2018-09-20 DIAGNOSIS — E119 Type 2 diabetes mellitus without complications: Secondary | ICD-10-CM | POA: Diagnosis not present

## 2018-09-20 LAB — POCT URINALYSIS DIP (MANUAL ENTRY)
Bilirubin, UA: NEGATIVE
Blood, UA: NEGATIVE
Glucose, UA: NEGATIVE mg/dL
Ketones, POC UA: NEGATIVE mg/dL
Leukocytes, UA: NEGATIVE
Nitrite, UA: POSITIVE — AB
Protein Ur, POC: NEGATIVE mg/dL
Spec Grav, UA: 1.015 (ref 1.010–1.025)
Urobilinogen, UA: 0.2 E.U./dL
pH, UA: 6 (ref 5.0–8.0)

## 2018-09-20 LAB — POCT UA - MICROSCOPIC ONLY

## 2018-09-20 LAB — POCT GLYCOSYLATED HEMOGLOBIN (HGB A1C): HbA1c, POC (controlled diabetic range): 6.9 % (ref 0.0–7.0)

## 2018-09-20 MED ORDER — CEPHALEXIN 500 MG PO CAPS: 500.0000 mg | ORAL_CAPSULE | Freq: Four times a day (QID) | ORAL | 0 refills | Status: AC

## 2018-09-20 NOTE — Progress Notes (Signed)
   Subjective:    Patient ID: Tiffany Becker, female    DOB: 05/06/1945, 73 y.o.   MRN: [id number]   CC: concern for UTI  HPI: Concern for UTI Patient for concern of "bladder infection".  States that she has frequent bladder infections and previous primary care provider would give her standing antibiotics to treat them.  States that they always occur in her right kidney.  Reports recent intermittent pelvic pain that radiates to her umbilicus.  Does report some intermittent right flank pain but not constant.  Denies any fevers.  Reports that urine has no color or odor changes or hematuria.  Denies any frequency urgency but she never has this when she has UTIs that were positive in urine culture.  Denies any dysuria.  Denies any vaginal discharge.  Does have history of frequent abdominal surgeries.  History of umbilical hernia surgery at 73 years old.  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  Patient has also had a history of a hysterectomy.  Family history significant for bladder cancer.    Objective:  BP (!) 148/72   Pulse (!) 57   SpO2 99%  Vitals and nursing note reviewed  General: well nourished, in no acute distress HEENT: normocephalic, no scleral icterus or conjunctival pallor, no nasal discharge, moist mucous membranes, good dentition without erythema or discharge noted in posterior oropharynx Cardiac: RRR, clear S1 and S2, no murmurs, rubs, or gallops Respiratory: clear to auscultation bilaterally, no increased work of breathing Abdomen: soft, nontender, nondistended, no masses or organomegaly. Bowel sounds present Extremities: no edema or cyanosis. Warm, well perfused.   Skin: warm and dry, no rashes noted Neuro: alert and oriented, no focal deficits   Assessment & Plan:    UTI (urinary tract infection) Patient with symptoms consistent of her previous UTIs. Urinalysis positive for nitrites.  Microscopy showing 3+ bacteria.  Urine  culture was obtained.  We will plan to prescribe Keflex every 6 hours x7 days.  Advised that if patient develops fever or back pain she should follow-up sooner as she may need longer course for pyelonephritis at that time.  Currently no red flags.  Strict return precautions given.  If urine culture is resistant to Keflex we will plan to prescribe another antibiotic at that time.  Strict return precautions given.  Follow-up in 1 to 2 weeks if no improvement.    Return in 1 week (on 09/27/2018), or if symptoms worsen or fail to improve.   Caroline More, DO, PGY-3

## 2018-09-20 NOTE — Assessment & Plan Note (Signed)
Patient with symptoms consistent of her previous UTIs. Urinalysis positive for nitrites.  Microscopy showing 3+ bacteria.  Urine culture was obtained.  We will plan to prescribe Keflex every 6 hours x7 days.  Advised that if patient develops fever or back pain she should follow-up sooner as she may need longer course for pyelonephritis at that time.  Currently no red flags.  Strict return precautions given.  If urine culture is resistant to Keflex we will plan to prescribe another antibiotic at that time.  Strict return precautions given.  Follow-up in 1 to 2 weeks if no improvement.

## 2018-09-20 NOTE — Patient Instructions (Addendum)
Urinary Tract Infection, Adult A urinary tract infection (UTI) is an infection of any part of the urinary tract. The urinary tract includes:  The kidneys.  The ureters.  The bladder.  The urethra. These organs make, store, and get rid of pee (urine) in the body. What are the causes? This is caused by germs (bacteria) in your genital area. These germs grow and cause swelling (inflammation) of your urinary tract. What increases the risk? You are more likely to develop this condition if:  You have a small, thin tube (catheter) to drain pee.  You cannot control when you pee or poop (incontinence).  You are female, and: ? You use these methods to prevent pregnancy: ? A medicine that kills sperm (spermicide). ? A device that blocks sperm (diaphragm). ? You have low levels of a female hormone (estrogen). ? You are pregnant.  You have genes that add to your risk.  You are sexually active.  You take antibiotic medicines.  You have trouble peeing because of: ? A prostate that is bigger than normal, if you are female. ? A blockage in the part of your body that drains pee from the bladder (urethra). ? A kidney stone. ? A nerve condition that affects your bladder (neurogenic bladder). ? Not getting enough to drink. ? Not peeing often enough.  You have other conditions, such as: ? Diabetes. ? A weak disease-fighting system (immune system). ? Sickle cell disease. ? Gout. ? Injury of the spine. What are the signs or symptoms? Symptoms of this condition include:  Needing to pee right away (urgently).  Peeing often.  Peeing small amounts often.  Pain or burning when peeing.  Blood in the pee.  Pee that smells bad or not like normal.  Trouble peeing.  Pee that is cloudy.  Fluid coming from the vagina, if you are female.  Pain in the belly or lower back. Other symptoms include:  Throwing up (vomiting).  No urge to eat.  Feeling mixed up (confused).  Being tired  and grouchy (irritable).  A fever.  Watery poop (diarrhea). How is this treated? This condition may be treated with:  Antibiotic medicine.  Other medicines.  Drinking enough water. Follow these instructions at home:  Medicines  Take over-the-counter and prescription medicines only as told by your doctor.  If you were prescribed an antibiotic medicine, take it as told by your doctor. Do not stop taking it even if you start to feel better. General instructions  Make sure you: ? Pee until your bladder is empty. ? Do not hold pee for a long time. ? Empty your bladder after sex. ? Wipe from front to back after pooping if you are a female. Use each tissue one time when you wipe.  Drink enough fluid to keep your pee pale yellow.  Keep all follow-up visits as told by your doctor. This is important. Contact a doctor if:  You do not get better after 1-2 days.  Your symptoms go away and then come back. Get help right away if:  You have very bad back pain.  You have very bad pain in your lower belly.  You have a fever.  You are sick to your stomach (nauseous).  You are throwing up. Summary  A urinary tract infection (UTI) is an infection of any part of the urinary tract.  This condition is caused by germs in your genital area.  There are many risk factors for a UTI. These include having a small, thin  tube to drain pee and not being able to control when you pee or poop.  Treatment includes antibiotic medicines for germs.  Drink enough fluid to keep your pee pale yellow. This information is not intended to replace advice given to you by your health care provider. Make sure you discuss any questions you have with your health care provider. Document Released: 08/17/2007 Document Revised: 02/15/2018 Document Reviewed: 09/07/2017 Elsevier Patient Education  2020 [employer].  If worsening pain, fever, or worsening back pain please follow up ASAP.   Dr. Tammi Klippel

## 2018-09-23 LAB — URINE CULTURE

## 2018-10-05 ENCOUNTER — Other Ambulatory Visit: Payer: Self-pay | Admitting: Family Medicine

## 2018-12-20 DIAGNOSIS — Z1231 Encounter for screening mammogram for malignant neoplasm of breast: Secondary | ICD-10-CM | POA: Diagnosis not present

## 2018-12-24 DIAGNOSIS — M7062 Trochanteric bursitis, left hip: Secondary | ICD-10-CM | POA: Diagnosis not present

## 2019-01-09 ENCOUNTER — Encounter: Payer: Self-pay | Admitting: Family Medicine

## 2019-01-11 ENCOUNTER — Other Ambulatory Visit: Payer: Self-pay

## 2019-01-11 ENCOUNTER — Telehealth: Payer: Self-pay

## 2019-01-12 MED ORDER — AMLODIPINE BESYLATE 5 MG PO TABS: 5.0000 mg | ORAL_TABLET | Freq: Every day | ORAL | 0 refills | Status: DC

## 2019-01-15 ENCOUNTER — Ambulatory Visit: Payer: Medicare Other | Admitting: Family Medicine

## 2019-01-15 NOTE — Telephone Encounter (Signed)
error 

## 2019-01-17 ENCOUNTER — Ambulatory Visit (INDEPENDENT_AMBULATORY_CARE_PROVIDER_SITE_OTHER): Payer: Medicare Other | Admitting: Family Medicine

## 2019-01-17 ENCOUNTER — Other Ambulatory Visit: Payer: Self-pay

## 2019-01-17 VITALS — BP 138/80 | HR 69 | Wt 210.2 lb

## 2019-01-17 DIAGNOSIS — Z1211 Encounter for screening for malignant neoplasm of colon: Secondary | ICD-10-CM

## 2019-01-17 DIAGNOSIS — E119 Type 2 diabetes mellitus without complications: Secondary | ICD-10-CM

## 2019-01-17 DIAGNOSIS — M8580 Other specified disorders of bone density and structure, unspecified site: Secondary | ICD-10-CM

## 2019-01-17 DIAGNOSIS — E785 Hyperlipidemia, unspecified: Secondary | ICD-10-CM

## 2019-01-17 DIAGNOSIS — E559 Vitamin D deficiency, unspecified: Secondary | ICD-10-CM

## 2019-01-17 DIAGNOSIS — I10 Essential (primary) hypertension: Secondary | ICD-10-CM

## 2019-01-17 LAB — POCT GLYCOSYLATED HEMOGLOBIN (HGB A1C): HbA1c, POC (controlled diabetic range): 6.8 % (ref 0.0–7.0)

## 2019-01-17 MED ORDER — AMLODIPINE BESYLATE 5 MG PO TABS: 5.0000 mg | ORAL_TABLET | Freq: Every day | ORAL | 2 refills | Status: DC

## 2019-01-17 MED ORDER — ATORVASTATIN CALCIUM 40 MG PO TABS: 40.0000 mg | ORAL_TABLET | ORAL | 2 refills | Status: DC

## 2019-01-17 NOTE — Assessment & Plan Note (Signed)
Recheck vitamin  D level today.  

## 2019-01-17 NOTE — Assessment & Plan Note (Signed)
Well-controlled.  Continue current regimen. 

## 2019-01-17 NOTE — Progress Notes (Signed)
Date of Visit: 01/17/2019   HPI:  Tiffany Becker presents to discuss bump in her finger, also for routine follow up.  Bump on finger - has noticed for some time. Mobile nodule on the palmar aspect of her fourth digit. Recently begun to become painful. Very mobile, can move on either side of her finger.   Vit D deficiency - completed course of repletion. Agreeable to getting level rechecked today  Diabetes - currently taking glyburide 1.25mg  daily and metformin 1000mg  twice daily. Does not check sugars. No signs or symptoms of low sugars lately. Has eye exam scheduled in March, her appointment got delayed due to [place] pandemic.  Hypertension - taking lisinopril 40mg  daily and amlodipine 5mg  daily. Tolerating these well.  Hyperlipidemia - taking lipitor 40mg  on M, W, F as has not tolerated it daily in the past.  ROS: See HPI.  [place]: history of hypertension, hyperlipidemia, diabetes, osteopenia  PHYSICAL EXAM: BP 138/80   Pulse 69   Wt 210 lb 3.2 oz (95.3 kg)   SpO2 96%   BMI 31.04 kg/m  Gen: no acute distress, pleasant, cooperative HEENT: normocephalic, atraumatic Heart: regular rate and rhythm, no murmur Lungs: clear to auscultation bilaterally, normal work of breathing Neuro: alert, speech normal Diabetic foot exam: 2+ DP pulses bilat, normal monofilament testing bilaterally. No lesions or significant calluses.  Extremities: 67mm mobile subcutaneous hard circular mass just distal to the R 4th MCP joint on the palmar aspect of the finger. No swelling, erythema, or significant tenderness to palpation in the area.  ASSESSMENT/PLAN:  Health maintenance:  -cologuard ordered today -normal foot exam today -eye exam scheduled in March  Diabetes mellitus without complication ([place]) Well controlled. Continue current regimen.   Dyslipidemia Tolerating lipitor q M,W,F. Continue.  Essential hypertension, benign Well controlled. Continue current regimen.   Vitamin D deficiency Recheck  vitamin D level today   Cyst on finger - suspect ganglion cyst. Given that it has become painful, patient desiring removal. Will place referral to hand surgery for further management.  FOLLOW UP: Follow up in 3 mos for above issues Referring to hand surgery  [place] J. Ardelia Mems, [place]

## 2019-01-17 NOTE — Patient Instructions (Addendum)
Get your bone density test  Checking vitamin D and A1c today  You'll get cologuard in the mail  Referring to hand specialist  Call with any questions Follow up with me in 3 months, sooner if needed  Be well, Dr. Ardelia Mems

## 2019-01-17 NOTE — Assessment & Plan Note (Signed)
Tolerating lipitor q M,W,F. Continue.

## 2019-01-18 LAB — VITAMIN D 25 HYDROXY (VIT D DEFICIENCY, FRACTURES): Vit D, 25-Hydroxy: 22.7 ng/mL — ABNORMAL LOW (ref 30.0–100.0)

## 2019-01-29 DIAGNOSIS — Z1212 Encounter for screening for malignant neoplasm of rectum: Secondary | ICD-10-CM | POA: Diagnosis not present

## 2019-01-29 DIAGNOSIS — Z1211 Encounter for screening for malignant neoplasm of colon: Secondary | ICD-10-CM | POA: Diagnosis not present

## 2019-01-29 LAB — COLOGUARD: Cologuard: NEGATIVE

## 2019-02-13 ENCOUNTER — Telehealth: Payer: Self-pay | Admitting: Family Medicine

## 2019-02-13 DIAGNOSIS — E559 Vitamin D deficiency, unspecified: Secondary | ICD-10-CM

## 2019-02-13 MED ORDER — CHOLECALCIFEROL 1.25 MG (50000 UT) PO TABS: 1.0000 | ORAL_TABLET | ORAL | 0 refills | Status: DC

## 2019-02-13 NOTE — Telephone Encounter (Signed)
Called patient to discuss low vit D level. It's been several months since she completed weekly repletion Will repeat 12 weeks of 50k units per week, then recheck level.  Repeat level ordered, and rx sent in. Patient will call to schedule lab visit when she's done taking the medication. Patient appreciative Leeanne Rio, MD

## 2019-02-15 ENCOUNTER — Telehealth: Payer: Self-pay | Admitting: *Deleted

## 2019-02-15 NOTE — Telephone Encounter (Signed)
Pt had a possible exposure to Covid on Tuesday (12/2).  A person who was at church with them was exposed by boyfriend, who was positive but did not show symptoms.  Patient did hug church member but they were both wearing mask.  The person patient hugged is currently asymptomatic as well as pt and her husband.  Pt was just informed today that the church member was exposed.  Advised that pt and her husband should quarantine and watch for signs of covid.  They will call if they start to experience symptoms.  To PCP, for more instructions if indicated. Christen Bame, CMA

## 2019-02-15 NOTE — Telephone Encounter (Signed)
Called and spoke with patient. The person she hugged at church is undergoing testing but results have not returned. Advised that if her test is positive, then patient and husband should be tested. Offered testing now if they would like, gave info on testing sites. Advised against hugging anyone outside of their household. Patient appreciative.  Leeanne Rio, MD

## 2019-03-01 ENCOUNTER — Other Ambulatory Visit: Payer: Self-pay | Admitting: Family Medicine

## 2019-04-04 ENCOUNTER — Ambulatory Visit: Payer: Medicare Other | Attending: Internal Medicine

## 2019-04-04 DIAGNOSIS — Z23 Encounter for immunization: Secondary | ICD-10-CM | POA: Insufficient documentation

## 2019-04-04 NOTE — Progress Notes (Signed)
   Covid-19 Vaccination Clinic  Name:  Tiffany Becker    MRN: [id number] DOB: 07-25-1945  04/04/2019  Tiffany Becker was observed post Covid-19 immunization for 15 minutes without incidence. She was provided with Vaccine Information Sheet and instruction to access the V-Safe system.   Tiffany Becker was instructed to call 911 with any severe reactions post vaccine: Marland Kitchen Difficulty breathing  . Swelling of your face and throat  . A fast heartbeat  . A bad rash all over your body  . Dizziness and weakness    Immunizations Administered    Name Date Dose VIS Date Route   Pfizer COVID-19 Vaccine 04/04/2019 10:41 AM 0.3 mL 02/22/2019 Intramuscular   Manufacturer: [place]   Lot: [id number]   [place]: [id number]

## 2019-04-17 DIAGNOSIS — H40013 Open angle with borderline findings, low risk, bilateral: Secondary | ICD-10-CM | POA: Diagnosis not present

## 2019-04-17 DIAGNOSIS — E1136 Type 2 diabetes mellitus with diabetic cataract: Secondary | ICD-10-CM | POA: Diagnosis not present

## 2019-04-17 DIAGNOSIS — H2513 Age-related nuclear cataract, bilateral: Secondary | ICD-10-CM | POA: Diagnosis not present

## 2019-04-17 LAB — HM DIABETES EYE EXAM

## 2019-04-20 ENCOUNTER — Other Ambulatory Visit: Payer: Self-pay | Admitting: Family Medicine

## 2019-04-25 ENCOUNTER — Ambulatory Visit: Payer: Medicare Other | Attending: Internal Medicine

## 2019-04-25 DIAGNOSIS — Z23 Encounter for immunization: Secondary | ICD-10-CM | POA: Insufficient documentation

## 2019-04-25 NOTE — Progress Notes (Signed)
   Covid-19 Vaccination Clinic  Name:  Tiffany Becker    MRN: [id number] DOB: 06-Dec-1945  04/25/2019  [name] was observed post Covid-19 immunization for 15 minutes without incidence. She was provided with Vaccine Information Sheet and instruction to access the V-Safe system.   [name] was instructed to call 911 with any severe reactions post vaccine: Marland Kitchen Difficulty breathing  . Swelling of your face and throat  . A fast heartbeat  . A bad rash all over your body  . Dizziness and weakness    Immunizations Administered    Name Date Dose VIS Date Route   Pfizer COVID-19 Vaccine 04/25/2019 10:21 AM 0.3 mL 02/22/2019 Intramuscular   Manufacturer: [place]   Lot: [id number]   [place]: [id number]

## 2019-04-29 ENCOUNTER — Other Ambulatory Visit: Payer: Self-pay

## 2019-04-29 ENCOUNTER — Ambulatory Visit
Admission: RE | Admit: 2019-04-29 | Discharge: 2019-04-29 | Disposition: A | Discharge status: Home / Self Care | Payer: Medicare Other | Source: Ambulatory Visit | Attending: Family Medicine | Admitting: Family Medicine

## 2019-04-29 DIAGNOSIS — M8580 Other specified disorders of bone density and structure, unspecified site: Secondary | ICD-10-CM

## 2019-04-29 DIAGNOSIS — Z780 Asymptomatic menopausal state: Secondary | ICD-10-CM | POA: Diagnosis not present

## 2019-04-29 DIAGNOSIS — M85852 Other specified disorders of bone density and structure, left thigh: Secondary | ICD-10-CM | POA: Diagnosis not present

## 2019-05-14 ENCOUNTER — Emergency Department (HOSPITAL_COMMUNITY): Payer: Medicare Other

## 2019-05-14 ENCOUNTER — Emergency Department (HOSPITAL_COMMUNITY)
Admission: EM | Admit: 2019-05-14 | Discharge: 2019-05-14 | Disposition: A | Discharge status: Home / Self Care | Payer: Medicare Other | Attending: Emergency Medicine | Admitting: Emergency Medicine

## 2019-05-14 ENCOUNTER — Other Ambulatory Visit: Payer: Self-pay

## 2019-05-14 ENCOUNTER — Telehealth: Payer: Self-pay | Admitting: Family Medicine

## 2019-05-14 ENCOUNTER — Ambulatory Visit: Payer: Medicare Other | Admitting: Family Medicine

## 2019-05-14 DIAGNOSIS — Z7982 Long term (current) use of aspirin: Secondary | ICD-10-CM | POA: Insufficient documentation

## 2019-05-14 DIAGNOSIS — Z7984 Long term (current) use of oral hypoglycemic drugs: Secondary | ICD-10-CM | POA: Insufficient documentation

## 2019-05-14 DIAGNOSIS — I6523 Occlusion and stenosis of bilateral carotid arteries: Secondary | ICD-10-CM | POA: Diagnosis not present

## 2019-05-14 DIAGNOSIS — E119 Type 2 diabetes mellitus without complications: Secondary | ICD-10-CM | POA: Diagnosis not present

## 2019-05-14 DIAGNOSIS — Z79899 Other long term (current) drug therapy: Secondary | ICD-10-CM | POA: Diagnosis not present

## 2019-05-14 DIAGNOSIS — G459 Transient cerebral ischemic attack, unspecified: Secondary | ICD-10-CM

## 2019-05-14 DIAGNOSIS — I10 Essential (primary) hypertension: Secondary | ICD-10-CM | POA: Diagnosis not present

## 2019-05-14 DIAGNOSIS — R200 Anesthesia of skin: Secondary | ICD-10-CM | POA: Diagnosis present

## 2019-05-14 LAB — URINALYSIS, ROUTINE W REFLEX MICROSCOPIC
Bilirubin Urine: NEGATIVE
Glucose, UA: 50 mg/dL — AB
Hgb urine dipstick: NEGATIVE
Ketones, ur: NEGATIVE mg/dL
Leukocytes,Ua: NEGATIVE
Nitrite: NEGATIVE
Protein, ur: NEGATIVE mg/dL
Specific Gravity, Urine: 1.006 (ref 1.005–1.030)
pH: 5 (ref 5.0–8.0)

## 2019-05-14 LAB — COMPREHENSIVE METABOLIC PANEL
ALT: 25 U/L (ref 0–44)
AST: 22 U/L (ref 15–41)
Albumin: 3.8 g/dL (ref 3.5–5.0)
Alkaline Phosphatase: 68 U/L (ref 38–126)
Anion gap: 10 (ref 5–15)
BUN: 9 mg/dL (ref 8–23)
CO2: 24 mmol/L (ref 22–32)
Calcium: 9.4 mg/dL (ref 8.9–10.3)
Chloride: 99 mmol/L (ref 98–111)
Creatinine, Ser: 0.97 mg/dL (ref 0.44–1.00)
GFR calc Af Amer: >60 mL/min (ref >60)
GFR calc non Af Amer: 58 mL/min — ABNORMAL LOW (ref >60)
Glucose, Bld: 150 mg/dL — ABNORMAL HIGH (ref 70–99)
Potassium: 4.6 mmol/L (ref 3.5–5.1)
Sodium: 133 mmol/L — ABNORMAL LOW (ref 135–145)
Total Bilirubin: 0.7 mg/dL (ref 0.3–1.2)
Total Protein: 6.5 g/dL (ref 6.5–8.1)

## 2019-05-14 LAB — CBC WITH DIFFERENTIAL/PLATELET
Abs Immature Granulocytes: 0.03 10*3/uL (ref 0.00–0.07)
Basophils Absolute: 0.1 10*3/uL (ref 0.0–0.1)
Basophils Relative: 1 %
Eosinophils Absolute: 0.1 10*3/uL (ref 0.0–0.5)
Eosinophils Relative: 1 %
HCT: 42.7 % (ref 36.0–46.0)
Hemoglobin: 14 g/dL (ref 12.0–15.0)
Immature Granulocytes: 0 %
Lymphocytes Relative: 25 %
Lymphs Abs: 1.8 10*3/uL (ref 0.7–4.0)
MCH: 30.2 pg (ref 26.0–34.0)
MCHC: 32.8 g/dL (ref 30.0–36.0)
MCV: 92.2 fL (ref 80.0–100.0)
Monocytes Absolute: 0.7 10*3/uL (ref 0.1–1.0)
Monocytes Relative: 10 %
Neutro Abs: 4.3 10*3/uL (ref 1.7–7.7)
Neutrophils Relative %: 63 %
Platelets: 258 10*3/uL (ref 150–400)
RBC: 4.63 MIL/uL (ref 3.87–5.11)
RDW: 12.7 % (ref 11.5–15.5)
WBC: 6.9 10*3/uL (ref 4.0–10.5)
nRBC: 0 % (ref 0.0–0.2)

## 2019-05-14 LAB — RAPID URINE DRUG SCREEN, HOSP PERFORMED
Amphetamines: NOT DETECTED
Barbiturates: NOT DETECTED
Benzodiazepines: NOT DETECTED
Cocaine: NOT DETECTED
Opiates: NOT DETECTED
Tetrahydrocannabinol: NOT DETECTED

## 2019-05-14 LAB — ETHANOL: Alcohol, Ethyl (B): <10 mg/dL (ref <10)

## 2019-05-14 LAB — APTT: aPTT: 29 seconds (ref 24–36)

## 2019-05-14 LAB — PROTIME-INR
INR: 0.9 (ref 0.8–1.2)
Prothrombin Time: 12.1 seconds (ref 11.4–15.2)

## 2019-05-14 LAB — I-STAT CREATININE, ED: Creatinine, Ser: 0.8 mg/dL (ref 0.44–1.00)

## 2019-05-14 MED ORDER — ASPIRIN 81 MG PO CHEW
324.0000 mg | CHEWABLE_TABLET | Freq: Once | ORAL | Status: AC
  Administered 2019-05-14: 14:00:00 324 mg via ORAL
  Filled 2019-05-14: qty 4

## 2019-05-14 MED ORDER — IOHEXOL 350 MG/ML SOLN
100.0000 mL | Freq: Once | INTRAVENOUS | Status: AC | PRN
  Administered 2019-05-14: 100 mL via INTRAVENOUS

## 2019-05-14 MED ORDER — ASPIRIN EC 81 MG PO TBEC: 81.0000 mg | DELAYED_RELEASE_TABLET | Freq: Every day | ORAL | 1 refills | Status: DC

## 2019-05-14 NOTE — ED Notes (Signed)
Pt transported to MRI 

## 2019-05-14 NOTE — Telephone Encounter (Signed)
Patient scheduled a same day appointment this morning for numbness in her face and left arm. Called her to discuss. Since Saturday has had multiple episodes of transient numbness in her left hand and left face/upper lip.  She has risk factors for CVA. Concern for ongoing TIAs. Needs to be evaluated in the ED for prompt imaging. Advised she go to ED. Canceled appointment. Patient appreciative and will go to ED.  Leeanne Rio, MD

## 2019-05-14 NOTE — ED Provider Notes (Signed)
[facility] DEPARTMENT Provider Note   CSN: [id number] Arrival date & time: 05/14/19  1001     History Chief Complaint  Patient presents with  . Numbness    Tiffany Becker is a 74 y.o. female.  HPI 74 year old female presents with numbness.  Started 3 days ago while she was doing dishes.  Felt a sudden numbness to her upper lip, and compassing the entire left upper lip and partially her right upper lip.  Also notes concomitant numbness to the left hand in a glove distribution.  Went away after a couple minutes.  Recurred this morning and then went away after a minute or 2.  Never had weakness.  Just prior to both episodes she would feel fullness in her left ear like she had fluid.  No headache.  Currently asymptomatic. Never had weakness.   Past Medical History:  Diagnosis Date  . Abnormal renal ultrasound 04/25/02  . Diabetes mellitus without complication ([place])   . Diverticulosis of colon 08/12/04  . GERD (gastroesophageal reflux disease)    H pylori neg 08/21/02  . Hypertension   . Peptic ulcer disease 03/14/80   EGD  . Primary osteoarthritis of both hips 4/09   Dr Mayer Camel  . Pyelonephritis 11/02  . Vitreous detachment     Patient Active Problem List   Diagnosis Date Noted  . Vitamin D deficiency 04/27/2018  . Bereavement 06/28/2016  . Bilateral shoulder pain 03/03/2015  . Osteopenia 10/30/2014  . Estrogen deficiency 10/20/2014  . Trigger finger 10/07/2013  . Cataracts, bilateral 07/26/2013  . Neoplasm of uncertain behavior of skin 07/23/2013  . Preventative health care 07/22/2013  . Essential hypertension, benign 11/03/2009  . Osteoarthritis of both knees 11/03/2009  . OSTEOARTHRITIS 08/09/2007  . HALLUX RIGIDUS, ACQUIRED 12/26/2006  . Diabetes mellitus without complication ([place]) [id number]  . Dyslipidemia 05/11/2006  . OBESITY, NOS 05/11/2006  . DDD (degenerative disc disease), cervical 05/11/2006    Past Surgical History:  Procedure  Laterality Date  . BREAST REDUCTION SURGERY  03/14/93  . BUNIONECTOMY  12/09   L with 2nd and 5th toe straightening  . CERVICAL DISCECTOMY  03/14/06   C6-7  . CHOLECYSTECTOMY  01/12/89  . 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 SALPINGOOPHORECTOMY  09/11/97   ruptured cyst  . TONSILLECTOMY  03/14/52  . TOTAL ABDOMINAL HYSTERECTOMY  01/12/89   L salpingoophorectomy for ruptured cyst  . TUBAL LIGATION  03/14/72  . UMBILICAL HERNIA REPAIR  03/14/64     OB History   No obstetric history on file.     Family History  Problem Relation Age of Onset  . Coronary artery disease Father 62       died age 57  . Heart disease Father   . Stroke Father   . Osteoarthritis Mother   . Asthma Mother   . Heart disease Mother   . Diabetes Brother        bladder, pacemaker, Hemochromatosis  . Cancer Brother 74       Bladder    Social History   Tobacco Use  . Smoking status: Never Smoker  . Smokeless tobacco: Never Used  Substance Use Topics  . Alcohol use: No  . Drug use: No    Home Medications Prior to Admission medications   Medication Sig Start Date End Date Taking? Authorizing Provider  amLODipine (NORVASC) 5 MG tablet Take 1 tablet (5 mg total) by mouth daily. Patient taking differently: Take 5 mg by mouth at bedtime.  01/17/19  Yes Leeanne Rio, MD  atorvastatin (LIPITOR) 40 MG tablet Take 1 tablet (40 mg total) by mouth every Monday, Wednesday, and Friday. 01/18/19  Yes Leeanne Rio, MD  celecoxib (CELEBREX) 100 MG capsule TAKE ONE CAPSULE BY MOUTH ONCE DAILY Patient taking differently: Take 100 mg by mouth daily as needed for mild pain.  06/17/16  Yes Lupita Dawn, MD  Cholecalciferol 1.25 MG (50000 UT) TABS Take 1 tablet by mouth once a week. For 12 weeks Patient taking differently: Take 50,000 Units by mouth once a week. For 12 weeks 02/13/19  Yes Leeanne Rio, MD  glyBURIDE (DIABETA) 2.5 MG tablet Take 0.5 tablets (1.25 mg total) by mouth daily with breakfast. Patient  taking differently: Take 1.25 mg by mouth at bedtime.  03/01/19  Yes Leeanne Rio, MD  lisinopril (PRINIVIL,ZESTRIL) 40 MG tablet TAKE 1 TABLET BY MOUTH  DAILY Patient taking differently: Take 40 mg by mouth daily.  05/31/18  Yes Leeanne Rio, MD  metFORMIN (GLUCOPHAGE) 1000 MG tablet TAKE 1 TABLET BY MOUTH 2  TIMES DAILY WITH A MEAL Patient taking differently: Take 1,000 mg by mouth 2 (two) times daily with a meal.  04/22/19  Yes Leeanne Rio, MD  aspirin EC 81 MG tablet Take 1 tablet (81 mg total) by mouth daily. 05/14/19   Sherwood Gambler, MD  glucose blood [facility] VERIO) test strip Check sugar once per day 09/29/17   Leeanne Rio, MD    Allergies    Simvastatin  Review of Systems   Review of Systems  Constitutional: Negative for fever.  HENT: Positive for ear pain (fullness).   Cardiovascular: Negative for chest pain.  Neurological: Positive for numbness. Negative for weakness and headaches.  All other systems reviewed and are negative.   Physical Exam Updated Vital Signs BP (!) 148/96 (BP Location: Left Arm)   Pulse 74   Temp 98.4 F (36.9 C) (Oral)   Resp 14   Ht 5\' 9"  (1.753 m)   Wt 90.7 kg   SpO2 94%   BMI 29.53 kg/m   Physical Exam Vitals and nursing note reviewed.  Constitutional:      Appearance: She is well-developed.  HENT:     Head: Normocephalic and atraumatic.     Right Ear: Tympanic membrane and external ear normal.     Left Ear: Tympanic membrane and external ear normal.     Nose: Nose normal.  Eyes:     General:        Right eye: No discharge.        Left eye: No discharge.     Extraocular Movements: Extraocular movements intact.     Pupils: Pupils are equal, round, and reactive to light.  Cardiovascular:     Rate and Rhythm: Normal rate and regular rhythm.     Heart sounds: Normal heart sounds.  Pulmonary:     Effort: Pulmonary effort is normal.     Breath sounds: Normal breath sounds.  Abdominal:     Palpations:  Abdomen is soft.     Tenderness: There is no abdominal tenderness.  Skin:    General: Skin is warm and dry.  Neurological:     Mental Status: She is alert.     Comments: CN 3-12 grossly intact. 5/5 strength in all 4 extremities. Grossly normal sensation. Normal finger to nose.   Psychiatric:        Mood and Affect: Mood is not anxious.  ED Results / Procedures / Treatments   Labs (all labs ordered are listed, but only abnormal results are displayed) Labs Reviewed  COMPREHENSIVE METABOLIC PANEL - Abnormal; Notable for the following components:      Result Value   Sodium 133 (*)    Glucose, Bld 150 (*)    GFR calc non Af Amer 58 (*)    All other components within normal limits  URINALYSIS, ROUTINE W REFLEX MICROSCOPIC - Abnormal; Notable for the following components:   Glucose, UA 50 (*)    All other components within normal limits  ETHANOL  PROTIME-INR  APTT  RAPID URINE DRUG SCREEN, HOSP PERFORMED  CBC WITH DIFFERENTIAL/PLATELET  I-STAT CREATININE, ED    EKG EKG Interpretation  Date/Time:  Tuesday May 14 2019 10:04:48 EST Ventricular Rate:  74 PR Interval:  128 QRS Duration: 84 QT Interval:  348 QTC Calculation: 386 R Axis:   60 Text Interpretation: Normal sinus rhythm no acute ST/T changes similar to 2008 Confirmed by Sherwood Gambler [phone]) on 05/14/2019 10:53:37 AM   Radiology CT Angio Head W or Wo Contrast  Result Date: 05/14/2019 CLINICAL DATA:  Transient ischemic attack (TIA). Additional history provided: Patient reports multiple episodes of lip and left arm tingling which started on Saturday while washing dishes, reports 2 similar episodes this morning neuro deficits at this time EXAM: CT ANGIOGRAPHY HEAD AND NECK TECHNIQUE: Multidetector CT imaging of the head and neck was performed using the standard protocol during bolus administration of intravenous contrast. Multiplanar CT image reconstructions and MIPs were obtained to evaluate the vascular anatomy.  Carotid stenosis measurements (when applicable) are obtained utilizing NASCET criteria, using the distal internal carotid diameter as the denominator. CONTRAST:  157mL OMNIPAQUE IOHEXOL 350 MG/ML SOLN COMPARISON:  Noncontrast head CT 06/08/2004, report from brain MRI 07/27/1999 (images unavailable). FINDINGS: CT HEAD FINDINGS Brain: There is no evidence of acute intracranial hemorrhage, intracranial mass, midline shift or extra-axial fluid collection.No demarcated cortical infarction. Mild scattered hypodensity within the cerebral white matter is nonspecific, but consistent with chronic small vessel ischemic disease. Findings have slightly progressed from prior head CT 06/08/2004. Mild generalized parenchymal atrophy has also progressed. Vascular: No hyperdense vessel.  Atherosclerotic calcifications. Skull: Normal. Negative for fracture or focal lesion. Sinuses: No significant paranasal sinus disease or mastoid effusion. Orbits: Visualized orbits demonstrate no acute abnormality. Review of the MIP images confirms the above findings CTA NECK FINDINGS Aortic arch: Standard aortic branching. Mild calcified plaque within the visualized aortic arch. No significant innominate or proximal subclavian artery stenosis. Right carotid system: CCA and ICA patent within the neck without stenosis. Left carotid system: CCA and ICA patent within the neck without stenosis. Focal irregularity of the distal cervical vertebral artery which may be due to atherosclerotic disease or reflect subtle changes of fibromuscular dysplasia. Vertebral arteries: Codominant and patent within the neck without stenosis Skeleton: Sequela of prior C6-C7 ACDF with solid fusion across the disc space. Superimposed cervical spondylosis. Multilevel bony neural foraminal narrowing greatest on the left at C3-C4, bilaterally at C4-C5 and on the left at C5-C6. No high-grade bony spinal canal stenosis. Other neck: No neck mass or cervical lymphadenopathy.  Subcentimeter thyroid nodules. Upper chest: No consolidation within the imaged lung apices. Review of the MIP images confirms the above findings CTA HEAD FINDINGS Anterior circulation: The intracranial internal carotid arteries are patent. Calcified plaque within the cavernous and paraclinoid segments bilaterally. Moderate narrowing within the cavernous and paraclinoid segments on the right, and of the paraclinoid segment on  the left. The M1 middle cerebral arteries are patent without significant stenosis. Atherosclerotic irregularity of M2 and more distal MCA branch vessels bilaterally. Most notably, there is a moderate focal stenosis within a proximal M2 left MCA branch vessel. (Series 14, image 21). The anterior cerebral arteries are patent without high-grade proximal stenosis. No intracranial aneurysm is identified. Posterior circulation: The intracranial vertebral arteries are patent although with prominent calcified plaque. Sites of moderate to moderately severe stenosis within both V4 vertebral arteries. The basilar artery is patent without significant stenosis. The bilateral posterior cerebral arteries are patent without significant proximal stenosis. Posterior communicating arteries are poorly delineated and may be hypoplastic or absent bilaterally. Venous sinuses: Within limitations of contrast timing, no convincing thrombus. Anatomic variants: As described Review of the MIP images confirms the above findings IMPRESSION: CT head: 1. No CT evidence of acute intracranial abnormality. 2. Mild generalized parenchymal atrophy and chronic small vessel ischemic disease, slightly progressed from prior CT 06/08/2004 CTA neck: 1. The bilateral common carotid and internal carotid arteries are patent within the neck without stenosis. Focal irregularity of the distal left cervical ICA which may reflect atherosclerotic disease or reflect subtle changes of fibromuscular dysplasia. 2. The vertebral arteries are patent  within the neck without significant stenosis. 3. Cervical spondylosis with sequela of prior C6-C7 ACDF. CTA head: 1. No intracranial large vessel occlusion. 2. Intracranial atherosclerotic disease with multifocal stenoses most notably as follows. 3. Sites of moderate stenosis within the cavernous and paraclinoid right ICA, and paraclinoid left ICA. 4. Moderate focal stenosis within a proximal left M2 MCA branch vessel. 5. Sites of moderate to moderately severe stenosis within the V4 vertebral arteries bilaterally. Electronically Signed   By: Kellie Simmering DO   On: 05/14/2019 13:05   CT Angio Neck W and/or Wo Contrast  Result Date: 05/14/2019 CLINICAL DATA:  Transient ischemic attack (TIA). Additional history provided: Patient reports multiple episodes of lip and left arm tingling which started on Saturday while washing dishes, reports 2 similar episodes this morning neuro deficits at this time EXAM: CT ANGIOGRAPHY HEAD AND NECK TECHNIQUE: Multidetector CT imaging of the head and neck was performed using the standard protocol during bolus administration of intravenous contrast. Multiplanar CT image reconstructions and MIPs were obtained to evaluate the vascular anatomy. Carotid stenosis measurements (when applicable) are obtained utilizing NASCET criteria, using the distal internal carotid diameter as the denominator. CONTRAST:  161mL OMNIPAQUE IOHEXOL 350 MG/ML SOLN COMPARISON:  Noncontrast head CT 06/08/2004, report from brain MRI 07/27/1999 (images unavailable). FINDINGS: CT HEAD FINDINGS Brain: There is no evidence of acute intracranial hemorrhage, intracranial mass, midline shift or extra-axial fluid collection.No demarcated cortical infarction. Mild scattered hypodensity within the cerebral white matter is nonspecific, but consistent with chronic small vessel ischemic disease. Findings have slightly progressed from prior head CT 06/08/2004. Mild generalized parenchymal atrophy has also progressed. Vascular:  No hyperdense vessel.  Atherosclerotic calcifications. Skull: Normal. Negative for fracture or focal lesion. Sinuses: No significant paranasal sinus disease or mastoid effusion. Orbits: Visualized orbits demonstrate no acute abnormality. Review of the MIP images confirms the above findings CTA NECK FINDINGS Aortic arch: Standard aortic branching. Mild calcified plaque within the visualized aortic arch. No significant innominate or proximal subclavian artery stenosis. Right carotid system: CCA and ICA patent within the neck without stenosis. Left carotid system: CCA and ICA patent within the neck without stenosis. Focal irregularity of the distal cervical vertebral artery which may be due to atherosclerotic disease or reflect subtle changes of fibromuscular  dysplasia. Vertebral arteries: Codominant and patent within the neck without stenosis Skeleton: Sequela of prior C6-C7 ACDF with solid fusion across the disc space. Superimposed cervical spondylosis. Multilevel bony neural foraminal narrowing greatest on the left at C3-C4, bilaterally at C4-C5 and on the left at C5-C6. No high-grade bony spinal canal stenosis. Other neck: No neck mass or cervical lymphadenopathy. Subcentimeter thyroid nodules. Upper chest: No consolidation within the imaged lung apices. Review of the MIP images confirms the above findings CTA HEAD FINDINGS Anterior circulation: The intracranial internal carotid arteries are patent. Calcified plaque within the cavernous and paraclinoid segments bilaterally. Moderate narrowing within the cavernous and paraclinoid segments on the right, and of the paraclinoid segment on the left. The M1 middle cerebral arteries are patent without significant stenosis. Atherosclerotic irregularity of M2 and more distal MCA branch vessels bilaterally. Most notably, there is a moderate focal stenosis within a proximal M2 left MCA branch vessel. (Series 14, image 21). The anterior cerebral arteries are patent without  high-grade proximal stenosis. No intracranial aneurysm is identified. Posterior circulation: The intracranial vertebral arteries are patent although with prominent calcified plaque. Sites of moderate to moderately severe stenosis within both V4 vertebral arteries. The basilar artery is patent without significant stenosis. The bilateral posterior cerebral arteries are patent without significant proximal stenosis. Posterior communicating arteries are poorly delineated and may be hypoplastic or absent bilaterally. Venous sinuses: Within limitations of contrast timing, no convincing thrombus. Anatomic variants: As described Review of the MIP images confirms the above findings IMPRESSION: CT head: 1. No CT evidence of acute intracranial abnormality. 2. Mild generalized parenchymal atrophy and chronic small vessel ischemic disease, slightly progressed from prior CT 06/08/2004 CTA neck: 1. The bilateral common carotid and internal carotid arteries are patent within the neck without stenosis. Focal irregularity of the distal left cervical ICA which may reflect atherosclerotic disease or reflect subtle changes of fibromuscular dysplasia. 2. The vertebral arteries are patent within the neck without significant stenosis. 3. Cervical spondylosis with sequela of prior C6-C7 ACDF. CTA head: 1. No intracranial large vessel occlusion. 2. Intracranial atherosclerotic disease with multifocal stenoses most notably as follows. 3. Sites of moderate stenosis within the cavernous and paraclinoid right ICA, and paraclinoid left ICA. 4. Moderate focal stenosis within a proximal left M2 MCA branch vessel. 5. Sites of moderate to moderately severe stenosis within the V4 vertebral arteries bilaterally. Electronically Signed   By: Kellie Simmering DO   On: 05/14/2019 13:05   MR BRAIN WO CONTRAST  Result Date: 05/14/2019 CLINICAL DATA:  TIA.  Numbness face and left arm EXAM: MRI HEAD WITHOUT CONTRAST TECHNIQUE: Multiplanar, multiecho pulse  sequences of the brain and surrounding structures were obtained without intravenous contrast. COMPARISON:  CT head 05/14/2019 FINDINGS: Brain: Negative for acute infarct. Few small white matter hyperintensities left greater than right consistent with chronic microvascular ischemia. Negative for hemorrhage or mass. Ventricle size normal. Age-appropriate cortical atrophy Vascular: Normal arterial flow voids Skull and upper cervical spine: No focal skeletal abnormality. Degenerative changes right C1-C2. Sinuses/Orbits: Negative Other: None IMPRESSION: No acute abnormality. Mild chronic microvascular ischemic change in the white matter. Electronically Signed   By: Franchot Gallo M.D.   On: 05/14/2019 15:26    Procedures Procedures (including critical care time)  Medications Ordered in ED Medications  iohexol (OMNIPAQUE) 350 MG/ML injection 100 mL (100 mLs Intravenous Contrast Given 05/14/19 1236)  aspirin chewable tablet 324 mg (324 mg Oral Given 05/14/19 1401)    ED Course  I have reviewed the  triage vital signs and the nursing notes.  Pertinent labs & imaging results that were available during my care of the patient were reviewed by me and considered in my medical decision making (see chart for details).    MDM Rules/Calculators/A&P                      Presentation is atypical, but given multiple areas of numbness, work-up for TIA started.  CT angiography without acute emergent findings.  Discussed with Dr. Lorraine Lax, who recommends MRI and if negative follow-up with neuro as well as starting aspirin.  MRI is normal.  She feels fine.  Discharge home with return precautions. Final Clinical Impression(s) / ED Diagnoses Final diagnoses:  TIA (transient ischemic attack)    Rx / DC Orders ED Discharge Orders         Ordered    Ambulatory referral to Neurology    Comments: An appointment is requested in approximately: 1 week   05/14/19 1535    aspirin EC 81 MG tablet  Daily     05/14/19 1544            Sherwood Gambler, MD 05/14/19 1551

## 2019-05-14 NOTE — ED Triage Notes (Signed)
Pt reports multiple episodes of lip and left arm tingling that first started on Saturday while washing dishes. Pt reports 2 similar episodes this morning about 1 hr ago. Pt a.o, no neuro deficits noted at this time

## 2019-05-14 NOTE — ED Notes (Signed)
Pt transported to CT ?

## 2019-05-14 NOTE — ED Notes (Signed)
Pt ambulated to restroom. 

## 2019-05-20 ENCOUNTER — Ambulatory Visit: Payer: Medicare Other | Admitting: Neurology

## 2019-05-20 ENCOUNTER — Encounter: Payer: Self-pay | Admitting: Neurology

## 2019-05-20 ENCOUNTER — Other Ambulatory Visit: Payer: Self-pay

## 2019-05-20 VITALS — BP 149/85 | HR 68 | Temp 97.6°F | Ht 69.0 in | Wt 209.0 lb

## 2019-05-20 DIAGNOSIS — R29898 Other symptoms and signs involving the musculoskeletal system: Secondary | ICD-10-CM

## 2019-05-20 NOTE — Progress Notes (Signed)
Provider:  Larey Seat, MD  Primary Care Physician:  Leeanne Rio, [place] [address]     Referring Provider: Leeanne Rio, [place],  [place] [address]   This time recommended by ED         Chief Complaint according to patient   Patient presents with:    . New Patient (Initial Visit)     pt states that about 1.5-2 wks ago she was washing dishes and statesthat her top lip went numb, then it went into her left hand. she states that it passed but since this has happened around 15 different times. it is not always in the same order or way except for on tues it was the same. she went to ED at that time. MRI/CT negative.      Other pt has had emergency neck sx before and she had similar symptoms. she states she is not stressed.    She received both covid vaccine doses 04/04/19 and 04/25/19.         HISTORY OF PRESENT ILLNESS:  Tiffany Becker is a 74 year old  Caucasian female patient and seen here upon referral on 05/20/2019 from ED.  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 [facility] - Dr Christella Noa.. The patient added she had severe neck injuries, and has undergone surgery.   The patient reports that she ran into a wire when she was a teenager being chased by a dog.  Put a deep gash into her neck.  She did have an MRI of the cervical spine which confirmed that there are degenerative changes as well as an injury she stated to the spinal cord.  She had an anterior  spinal fusion for the cervical spine after which she called a blowout of the disc. She now presents with upper lip ,transient and repeated, numbness.  Brain MRI and CT were negative at the ED visits.     Social history:  Patient is retired from Press photographer- and lives in a household with her spouse.  She has 2 grown daughters, 1 grandson 65,  Her daughter is a Tax adviser. Both had knee surgery.  Pets are not  present. Tobacco use; never , no passive smoke exposure at home.Marland Kitchen  ETOH use: none  ,  Caffeine intake in form of Coffee( in am ) Soda( none ) Tea ( no) or energy drinks.         Review of Systems: Out of a complete 14 system review, the patient complains of only the following symptoms, and all other reviewed systems are negative.:   Concerned about her upper lip becoming numb , only transient.  Lasting 1-2 minutes.   Social History   Socioeconomic History  . Marital status: Married    Spouse name: Jenny Reichmann  . Number of children: 2  . Years of education: 33  . Highest education level: 12th grade  Occupational History  . Occupation: Retired-Sales of store fixtures  Tobacco Use  . Smoking status: Never Smoker  . Smokeless tobacco: Never  Used  Substance and Sexual Activity  . Alcohol use: No  . Drug use: No  . Sexual activity: Not Currently  Other Topics Concern  . Not on file  Social History Narrative   Retired- Financial controller with 2 levels, 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 Smoke detectors, no tripping hazards.Has grab bars in bathroom.   2 daughters in the area; [place] and [place]   Emergency Contact: husband, Jenny Reichmann [phone]   End of Life Plan: pt has advance directives but does not have in chart, requested copy from family.   Who lives with you: husband    Any pets: dog   Diet: Pt has a varied diet of protein, starch and vegetables.   Exercise: Pt reports walking dog daily, yard work, church activities.   Seatbelts: Pt reports wearing seatbelt when in vehicles.    Nancy Fetter  Exposure/Protection: Pt reports using sunscreen regularly.    Hobbies: church, walking, yard work, cooking         Social Determinants of Radio broadcast assistant Strain:   . Difficulty of Paying Living Expenses: Not on file  Food Insecurity:   . Worried About Charity fundraiser in the Last Year: Not on file  . Ran Out of Food in the Last Year: Not on file  Transportation Needs:   . Lack of Transportation (Medical): Not on file  . Lack of Transportation (Non-Medical): Not on file  Physical Activity:   . Days of Exercise per Week: Not on file  . Minutes of Exercise per Session: Not on file  Stress:   . Feeling of Stress : Not on file  Social Connections:   . Frequency of Communication with Friends and Family: Not on file  . Frequency of Social Gatherings with Friends and Family: Not on file  . Attends Religious Services: Not on file  . Active Member of Clubs or Organizations: Not on file  . Attends Archivist Meetings: Not on file  . Marital Status: Not on file    Family History  Problem Relation Age of Onset  . Coronary artery disease Father 49       died age 31  . Heart disease Father   . Stroke Father   . Osteoarthritis Mother   . Asthma Mother   . Heart disease Mother   . Diabetes Brother        bladder, pacemaker, Hemochromatosis  . Cancer Brother 46       Bladder    Past Medical History:  Diagnosis Date  . Abnormal renal ultrasound 04/25/02  . Diabetes mellitus without complication ([place])   . Diverticulosis of colon 08/12/04  . GERD (gastroesophageal reflux disease)    H pylori neg 08/21/02  . Hypertension   . Peptic ulcer disease 03/14/80   EGD  . Primary osteoarthritis of both hips 4/09   Dr Mayer Camel  . Pyelonephritis 11/02  . Vitreous detachment     Past Surgical History:  Procedure Laterality Date  . BREAST REDUCTION SURGERY  03/14/93  . BUNIONECTOMY  12/09   L with 2nd and 5th toe straightening  . CERVICAL DISCECTOMY  03/14/06   C6-7  .  CHOLECYSTECTOMY  01/12/89  . 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 SALPINGOOPHORECTOMY  09/11/97   ruptured cyst  . TONSILLECTOMY  03/14/52  . TOTAL ABDOMINAL HYSTERECTOMY  01/12/89   L salpingoophorectomy for ruptured cyst  . TUBAL LIGATION  03/14/72  . UMBILICAL HERNIA REPAIR  03/14/64  Current Outpatient Medications on File Prior to Visit  Medication Sig Dispense Refill  . amLODipine (NORVASC) 5 MG tablet Take 1 tablet (5 mg total) by mouth daily. (Patient taking differently: Take 5 mg by mouth at bedtime. ) 90 tablet 2  . aspirin EC 81 MG tablet Take 1 tablet (81 mg total) by mouth daily. 30 tablet 1  . atorvastatin (LIPITOR) 40 MG tablet Take 1 tablet (40 mg total) by mouth every Monday, Wednesday, and Friday. 45 tablet 2  . celecoxib (CELEBREX) 100 MG capsule TAKE ONE CAPSULE BY MOUTH ONCE DAILY (Patient taking differently: Take 100 mg by mouth daily as needed for mild pain. ) 60 capsule 2  . Cholecalciferol 1.25 MG (50000 UT) TABS Take 1 tablet by mouth once a week. For 12 weeks (Patient taking differently: Take 50,000 Units by mouth once a week. For 12 weeks) 12 tablet 0  . glucose blood (ONETOUCH VERIO) test strip Check sugar once per day 100 each 12  . glyBURIDE (DIABETA) 2.5 MG tablet Take 0.5 tablets (1.25 mg total) by mouth daily with breakfast. (Patient taking differently: Take 1.25 mg by mouth at bedtime. ) 45 tablet 3  . lisinopril (PRINIVIL,ZESTRIL) 40 MG tablet TAKE 1 TABLET BY MOUTH  DAILY (Patient taking differently: Take 40 mg by mouth daily. ) 90 tablet 3  . metFORMIN (GLUCOPHAGE) 1000 MG tablet TAKE 1 TABLET BY MOUTH 2  TIMES DAILY WITH A MEAL (Patient taking differently: Take 1,000 mg by mouth 2 (two) times daily with a meal. ) 180 tablet 3   No current facility-administered medications on file prior to visit.    Allergies  Allergen Reactions  . Simvastatin Anaphylaxis    REACTION: hair loss and leg pain    Physical exam:  Today's Vitals   05/20/19 1333  BP: (!)  149/85  Pulse: 68  Temp: 97.6 F (36.4 C)  Weight: 209 lb (94.8 kg)  Height: 5\' 9"  (1.753 m)   Body mass index is 30.86 kg/m.   Wt Readings from Last 3 Encounters:  05/20/19 209 lb (94.8 kg)  05/14/19 200 lb (90.7 kg)  01/17/19 210 lb 3.2 oz (95.3 kg)     Ht Readings from Last 3 Encounters:  05/20/19 5\' 9"  (1.753 m)  05/14/19 5\' 9"  (1.753 m)  04/23/18 5\' 9"  (1.753 m)      General: The patient is awake, alert and appears not in acute distress. The patient is well groomed. Head: Normocephalic, atraumatic. Neck is supple. Mallampati ,  Has TMJ left and right. Left worse. DDD. Cervical anterior fusion scar is well healed.  neck circumference:15.5 inches.  Nasal airflow is patent.  Retrognathia is not seen.  Dental status: intact.  Cardiovascular:  Regular rate and cardiac rhythm by pulse, without distended neck veins. Respiratory: Lungs are clear to auscultation.  Skin:  Without evidence of ankle edema, or rash. Trunk: The patient's posture is erect.   Neurologic exam : The patient is awake and alert, oriented to place and time.   Memory subjective described as intact.  Attention span & concentration ability appears normal.  Speech is fluent,  without  dysarthria, dysphonia or aphasia.  Mood and affect are appropriate.   Cranial nerves: no loss of smell or taste reported  Pupils are equal and briskly reactive to light. Funduscopic exam deferred. .  Extraocular movements in vertical and horizontal planes were intact and without nystagmus. No Diplopia. Visual fields by finger perimetry are intact. Hearing was intact to soft voice and  finger rubbing.    Facial sensation intact to fine touch.  Facial motor strength is symmetric and tongue and uvula move midline.  Neck ROM : rotation, tilt and flexion extension were normal for age and shoulder shrug was symmetrical.    Motor exam:  Symmetric bulk, tone and ROM.   Normal tone without cog wheeling, symmetric grip strength .     Sensory:  Fine touch, pinprick and vibration were tested and normal.  Proprioception tested in the upper extremities was normal.    Gait and station: Patient could rise unassisted from a seated position, walked without assistive device.  Stance is of normal width/ base and the patient turned with 3 steps.  Toe and heel walk were deferred.  Deep tendon reflexes: in the upper and lower extremities are symmetric and intact.  Babinski response was deferred.    I reviewed the ED records and images.  Skull and upper cervical spine: No focal skeletal abnormality. Degenerative changes right C1-C2.  Sinuses/Orbits: Negative  Other: None  IMPRESSION: No acute abnormality. Mild chronic microvascular ischemic change in the white matter.   Electronically Signed   By: Franchot Gallo M.D.   On: 05/14/2019 15:26    After spending a total time of 45 minutes face to face and additional time for physical and neurologic examination, review of laboratory studies,  personal review of imaging studies, reports and results of other testing and review of referral information / records as far as provided in visit, I have established the following assessments:  1) I have no explanation except possible relation to TMJ and neck pain history.  She reports she can bring on the numbness by sitting in a certain chair- (!)>      My Plan is to proceed with: PRN visit.   1) I am not concerned about this patient having TIAs, her manifestations were much to short in duration and too selective for the left upper lip- I strongly suspect this is related to either cervical nerve 2-3 or TMJ.     I would like to thank Leeanne Rio, MD for allowing me to meet with and to take care of this pleasant patient.  Electronically signed by: Larey Seat, MD 05/20/2019 1:56 PM  [facility] Board certified by The [employer] of Sleep Medicine and Diplomate of the  [employer] of Sleep Medicine. Board certified In Neurology through the [place], Fellow of the [employer] of Neurology. Medical Director of [facility].

## 2019-05-23 ENCOUNTER — Other Ambulatory Visit: Payer: Self-pay | Admitting: Family Medicine

## 2019-06-05 ENCOUNTER — Encounter: Payer: Self-pay | Admitting: Family Medicine

## 2019-08-08 DIAGNOSIS — H40013 Open angle with borderline findings, low risk, bilateral: Secondary | ICD-10-CM | POA: Diagnosis not present

## 2019-12-24 ENCOUNTER — Encounter: Payer: Self-pay | Admitting: Family Medicine

## 2019-12-24 DIAGNOSIS — Z1231 Encounter for screening mammogram for malignant neoplasm of breast: Secondary | ICD-10-CM | POA: Diagnosis not present

## 2020-01-10 ENCOUNTER — Other Ambulatory Visit: Payer: Self-pay | Admitting: Family Medicine

## 2020-01-10 ENCOUNTER — Encounter: Payer: Self-pay | Admitting: Family Medicine

## 2020-01-10 DIAGNOSIS — E785 Hyperlipidemia, unspecified: Secondary | ICD-10-CM

## 2020-01-12 ENCOUNTER — Other Ambulatory Visit: Payer: Self-pay | Admitting: Family Medicine

## 2020-01-13 NOTE — Telephone Encounter (Signed)
-----   Message from Tiffany Rio, MD sent at 01/10/2020  4:28 PM EDT ----- Regarding: FW: Statin Evaluation Can you guys ask this patient to schedule a follow up visit with me?  Thanks [place] ----- Message ----- From: Reed Breech D Sent: 01/10/2020 To: Tiffany Rio, MD Subject: Statin Evaluation                              Dear Dr. Ardelia Mems,   I am a [facility] clinical pharmacist that reviews patients for statin quality initiatives.     Per review of chart and payor information, patient has a diagnosis of diabetes but is not currently filling a statin prescription regularly.  This places patient into the SUPD (Statin Use In Patients with Diabetes) measure for CMS.    -I spoke with the patient and she reports taking 1/2 tab of atorvastatin 40mg  TIW. Please consider updating her statin prescription.    10-year ASCVD risk: %  The 10-year ASCVD risk score Mikey Bussing DC Jr., et al., 2013) is: 38.6%   Values used to calculate the score:     Age: 74 years     Sex: Female     Is Non-Hispanic African American: No     Diabetic: Yes     Tobacco smoker: No     Systolic Blood Pressure: 381 mmHg     Is BP treated: Yes     HDL Cholesterol: 66 mg/dL     Total Cholesterol: 193 mg/dL Last LDL: 110 mg/dL   Please consider the following recommendations:   1) Consider trial of statin therapy. GDMT recommends at least a moderate intensity level statin in patients with diabetes and ASCVD risk > 7.5%.    Examples of moderate intensity statin therapy include the following:   Atorvastatin 20mg  three times weekly on Monday, Wednesday, Friday #39 3 refills    ------------------------------------------------------------------------------------   2) If intolerance is a concern, could consider a once, twice or three times weekly regimen as follows:  -Once weekly #13 (90DS) #3 refills  -Twice weekly #26 (90DS) 3 refills  -Three times weekly #39 (90DS) #3 refills    -----------------------------------------------------------------------------------   3) If appropriate, consider adding one the following SUPD exclusion CPT codes. This is not an all inclusive list.   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  Reed Breech, PharmD Clinical Pharmacist  [place] [phone]

## 2020-02-04 ENCOUNTER — Encounter: Payer: Self-pay | Admitting: Family Medicine

## 2020-02-04 ENCOUNTER — Other Ambulatory Visit: Payer: Self-pay

## 2020-02-04 ENCOUNTER — Ambulatory Visit (INDEPENDENT_AMBULATORY_CARE_PROVIDER_SITE_OTHER): Payer: Medicare Other | Admitting: Family Medicine

## 2020-02-04 VITALS — BP 135/80 | HR 67 | Ht 69.0 in | Wt 202.0 lb

## 2020-02-04 DIAGNOSIS — M8580 Other specified disorders of bone density and structure, unspecified site: Secondary | ICD-10-CM | POA: Diagnosis not present

## 2020-02-04 DIAGNOSIS — G729 Myopathy, unspecified: Secondary | ICD-10-CM | POA: Diagnosis not present

## 2020-02-04 DIAGNOSIS — M791 Myalgia, unspecified site: Secondary | ICD-10-CM | POA: Diagnosis not present

## 2020-02-04 DIAGNOSIS — E559 Vitamin D deficiency, unspecified: Secondary | ICD-10-CM

## 2020-02-04 DIAGNOSIS — E119 Type 2 diabetes mellitus without complications: Secondary | ICD-10-CM | POA: Diagnosis not present

## 2020-02-04 DIAGNOSIS — E785 Hyperlipidemia, unspecified: Secondary | ICD-10-CM | POA: Diagnosis not present

## 2020-02-04 DIAGNOSIS — M1990 Unspecified osteoarthritis, unspecified site: Secondary | ICD-10-CM

## 2020-02-04 LAB — POCT GLYCOSYLATED HEMOGLOBIN (HGB A1C): Hemoglobin A1C: 6.6 % — AB (ref 4.0–5.6)

## 2020-02-04 NOTE — Assessment & Plan Note (Addendum)
Cannot tolerate regular use of statins. Update CMET and lipids at upcoming lab visit, will await results before recommendations on other potential lipid lowering therapies

## 2020-02-04 NOTE — Assessment & Plan Note (Signed)
Struggles with tolerating statin. Check lipids at upcoming lab visit.

## 2020-02-04 NOTE — Patient Instructions (Addendum)
It was great to see you again today!  A1c is great today at 6.6  Recommend you get COVID booster  On your way out, schedule an appointment one morning to come back for fasting labs. Do not eat or drink anything other than water the morning of your lab appointment until after your labs are drawn.   You need a calcium supplement - will decide on which one after we get your Vit D level back  Happy Holidays!  Dr. Ardelia Mems

## 2020-02-04 NOTE — Assessment & Plan Note (Signed)
Well controlled with A1c of 6.6. continue glyburide Foot exam today Cannot tolerate aspirin or statin

## 2020-02-04 NOTE — Progress Notes (Signed)
  Date of Visit: 02/04/2020   SUBJECTIVE:   HPI:  Tiffany Becker presents today for routine follow up.  Diabetes - taking glyburide 1.25mg  daily. Tolerating well. A1c today 6.6. cannot tolerate aspirin, it upsets her stomach.  Hyperlipidemia - not fasting today. Really struggles with taking statins. Takes her atorvastatin on occasion but has significant leg cramping whenever this happens.  Osteopenia - not taking calcium supplement presently. Previously did vit D weekly 50k units but has not had it checked since then.  Arthritis - follows with ortho Dr. Berenice Primas, he prescribes celebrex which she takes on occasion. Requests handicap placard be renewed. Has had this for years, has trouble walking long distances due to pain in her body  OBJECTIVE:   BP 135/80   Pulse 67   Ht 5\' 9"  (1.753 m)   Wt 202 lb (91.6 kg)   SpO2 98%   BMI 29.83 kg/m  Gen: no acute distress, pleasant, cooperative HEENT: normocephalic, atraumatic  Heart: regular rate and rhythm, no murmur Lungs: clear to auscultation bilaterally, normal work of breathing  Neuro: alert, speech normal, grossly nonfocal Ext: No appreciable lower extremity edema bilaterally  Diabetic foot exam: 2+ DP pulses bilat, normal monofilament testing bilaterally. No lesions or significant calluses. Does have bruise over middle toe from previously hitting toe, improving per patient.   ASSESSMENT/PLAN:   Health maintenance:  -foot exam done today and overall unremarkable -recommend COVID booster, opts not to get today due to upcoming holiday and worry about side effects, will schedule appointment for it  Diabetes mellitus without complication ([place]) Well controlled with A1c of 6.6. continue glyburide Foot exam today Cannot tolerate aspirin or statin  Myalgia due to statin Cannot tolerate regular use of statins. Update CMET and lipids at upcoming lab visit, will await results before recommendations on other potential lipid lowering  therapies  Osteopenia T score -1.6 earlier this year on DEXA Needs calcium supplement, but will wait for result of Vit D test before deciding on specific supplement. She will return for fasting labs next week, will get Vit D at that time  Dyslipidemia Struggles with tolerating statin. Check lipids at upcoming lab visit.  Osteoarthritis Has had handicap placard for years, renewal form filled out today  FOLLOW UP: Follow up in 1 week for lab visit  [place] J. Ardelia Mems, [place]

## 2020-02-04 NOTE — Assessment & Plan Note (Addendum)
Has had handicap placard for years, renewal form filled out today

## 2020-02-04 NOTE — Assessment & Plan Note (Signed)
T score -1.6 earlier this year on DEXA Needs calcium supplement, but will wait for result of Vit D test before deciding on specific supplement. She will return for fasting labs next week, will get Vit D at that time

## 2020-02-13 ENCOUNTER — Other Ambulatory Visit: Payer: Medicare Other

## 2020-02-13 ENCOUNTER — Ambulatory Visit (INDEPENDENT_AMBULATORY_CARE_PROVIDER_SITE_OTHER): Payer: Medicare Other

## 2020-02-13 ENCOUNTER — Other Ambulatory Visit: Payer: Self-pay

## 2020-02-13 DIAGNOSIS — Z23 Encounter for immunization: Secondary | ICD-10-CM

## 2020-02-13 DIAGNOSIS — E119 Type 2 diabetes mellitus without complications: Secondary | ICD-10-CM | POA: Diagnosis not present

## 2020-02-13 DIAGNOSIS — E559 Vitamin D deficiency, unspecified: Secondary | ICD-10-CM | POA: Diagnosis not present

## 2020-02-13 NOTE — Progress Notes (Signed)
   Covid-19 Vaccination Clinic  Name:  LOREAL SCHUESSLER    MRN: [id number] DOB: 1945-06-03  02/13/2020  Ms. Long was observed post Covid-19 immunization for 15 minutes without incident. She was provided with Vaccine Information Sheet and instruction to access the V-Safe system.   Ms. Rhue was instructed to call 911 with any severe reactions post vaccine: Marland Kitchen Difficulty breathing  . Swelling of face and throat  . A fast heartbeat  . A bad rash all over body  . Dizziness and weakness   Booster administered LD without complication.

## 2020-02-14 LAB — CMP14+EGFR
ALT: 18 IU/L (ref 0–32)
AST: 18 IU/L (ref 0–40)
Albumin/Globulin Ratio: 2.1 (ref 1.2–2.2)
Albumin: 4.5 g/dL (ref 3.7–4.7)
Alkaline Phosphatase: 93 IU/L (ref 44–121)
BUN/Creatinine Ratio: 12 (ref 12–28)
BUN: 11 mg/dL (ref 8–27)
Bilirubin Total: 0.3 mg/dL (ref 0.0–1.2)
CO2: 23 mmol/L (ref 20–29)
Calcium: 9.7 mg/dL (ref 8.7–10.3)
Chloride: 99 mmol/L (ref 96–106)
Creatinine, Ser: 0.91 mg/dL (ref 0.57–1.00)
GFR calc Af Amer: 72 mL/min/{1.73_m2} (ref >59)
GFR calc non Af Amer: 63 mL/min/{1.73_m2} (ref >59)
Globulin, Total: 2.1 g/dL (ref 1.5–4.5)
Glucose: 159 mg/dL — ABNORMAL HIGH (ref 65–99)
Potassium: 4.9 mmol/L (ref 3.5–5.2)
Sodium: 137 mmol/L (ref 134–144)
Total Protein: 6.6 g/dL (ref 6.0–8.5)

## 2020-02-14 LAB — LIPID PANEL
Chol/HDL Ratio: 3.1 ratio (ref 0.0–4.4)
Cholesterol, Total: 197 mg/dL (ref 100–199)
HDL: 63 mg/dL (ref >39)
LDL Chol Calc (NIH): 115 mg/dL — ABNORMAL HIGH (ref 0–99)
Triglycerides: 106 mg/dL (ref 0–149)
VLDL Cholesterol Cal: 19 mg/dL (ref 5–40)

## 2020-02-14 LAB — VITAMIN D 25 HYDROXY (VIT D DEFICIENCY, FRACTURES): Vit D, 25-Hydroxy: 24.8 ng/mL — ABNORMAL LOW (ref 30.0–100.0)

## 2020-02-20 ENCOUNTER — Other Ambulatory Visit: Payer: Self-pay

## 2020-02-20 ENCOUNTER — Ambulatory Visit (INDEPENDENT_AMBULATORY_CARE_PROVIDER_SITE_OTHER): Payer: Medicare Other

## 2020-02-20 ENCOUNTER — Encounter: Payer: Self-pay | Admitting: Podiatry

## 2020-02-20 ENCOUNTER — Ambulatory Visit: Payer: Medicare Other | Admitting: Podiatry

## 2020-02-20 DIAGNOSIS — G8929 Other chronic pain: Secondary | ICD-10-CM | POA: Diagnosis not present

## 2020-02-20 DIAGNOSIS — M25571 Pain in right ankle and joints of right foot: Secondary | ICD-10-CM

## 2020-02-20 DIAGNOSIS — S9001XA Contusion of right ankle, initial encounter: Secondary | ICD-10-CM | POA: Diagnosis not present

## 2020-02-20 DIAGNOSIS — M85671 Other cyst of bone, right ankle and foot: Secondary | ICD-10-CM | POA: Diagnosis not present

## 2020-02-20 NOTE — Progress Notes (Signed)
Subjective:  Patient ID: Tiffany Becker, female    DOB: 02-Jul-1945,  MRN: [id number] HPI Chief Complaint  Patient presents with  . Ankle Pain    Lateral ankle right - fell into water meter x 3 weeks ago, aching, swelling, tried OTC pain meds- no help  . New Patient (Initial Visit)    74 y.o. female presents with the above complaint.   ROS: Denies fever chills nausea vomiting muscle aches pains calf pain back pain chest pain shortness of breath.  Past Medical History:  Diagnosis Date  . Abnormal renal ultrasound 04/25/02  . Diabetes mellitus without complication ([place])   . Diverticulosis of colon 08/12/04  . GERD (gastroesophageal reflux disease)    H pylori neg 08/21/02  . Hypertension   . Peptic ulcer disease 03/14/80   EGD  . Primary osteoarthritis of both hips 4/09   Dr Mayer Camel  . Pyelonephritis 11/02  . Vitreous detachment    Past Surgical History:  Procedure Laterality Date  . BREAST REDUCTION SURGERY  03/14/93  . BUNIONECTOMY  12/09   L with 2nd and 5th toe straightening  . CERVICAL DISCECTOMY  03/14/06   C6-7  . CHOLECYSTECTOMY  01/12/89  . 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 SALPINGOOPHORECTOMY  09/11/97   ruptured cyst  . TONSILLECTOMY  03/14/52  . TOTAL ABDOMINAL HYSTERECTOMY  01/12/89   L salpingoophorectomy for ruptured cyst  . TUBAL LIGATION  03/14/72  . UMBILICAL HERNIA REPAIR  03/14/64    Current Outpatient Medications:  .  amLODipine (NORVASC) 5 MG tablet, Take 1 tablet (5 mg total) by mouth at bedtime., Disp: 90 tablet, Rfl: 0 .  atorvastatin (LIPITOR) 40 MG tablet, Take 1 tablet (40 mg total) by mouth every Monday, Wednesday, and Friday., Disp: 45 tablet, Rfl: 2 .  celecoxib (CELEBREX) 100 MG capsule, TAKE ONE CAPSULE BY MOUTH ONCE DAILY (Patient taking differently: Take 100 mg by mouth daily as needed for mild pain. ), Disp: 60 capsule, Rfl: 2 .  glucose blood (ONETOUCH VERIO) test strip, Check sugar once per day, Disp: 100 each, Rfl: 12 .  glyBURIDE (DIABETA) 2.5  MG tablet, Take 0.5 tablets (1.25 mg total) by mouth daily with breakfast. (Patient taking differently: Take 1.25 mg by mouth at bedtime. ), Disp: 45 tablet, Rfl: 3 .  lisinopril (ZESTRIL) 40 MG tablet, TAKE 1 TABLET BY MOUTH  DAILY, Disp: 90 tablet, Rfl: 3 .  metFORMIN (GLUCOPHAGE) 1000 MG tablet, TAKE 1 TABLET BY MOUTH 2  TIMES DAILY WITH A MEAL (Patient taking differently: Take 1,000 mg by mouth 2 (two) times daily with a meal. ), Disp: 180 tablet, Rfl: 3  Allergies  Allergen Reactions  . Simvastatin Anaphylaxis    REACTION: hair loss and leg pain   Review of Systems Objective:  There were no vitals filed for this visit.  General: Well developed, nourished, in no acute distress, alert and oriented x3   Dermatological: Skin is warm, dry and supple bilateral. Nails x 10 are well maintained; remaining integument appears unremarkable at this time. There are no open sores, no preulcerative lesions, no rash or signs of infection present.  Vascular: Dorsalis Pedis artery and Posterior Tibial artery pedal pulses are 2/4 bilateral with immedate capillary fill time. Pedal hair growth present. No varicosities and no lower extremity edema present bilateral.   Neruologic: Grossly intact via light touch bilateral. Vibratory intact via tuning fork bilateral. Protective threshold with Semmes Wienstein monofilament intact to all pedal sites bilateral. Patellar and Achilles deep  tendon reflexes 2+ bilateral. No Babinski or clonus noted bilateral.   Musculoskeletal: No gross boney pedal deformities bilateral. No pain, crepitus, or limitation noted with foot and ankle range of motion bilateral. Muscular strength 5/5 in all groups tested bilateral.  She has swelling and pain with induration around the lateral malleolus.  She has mild erythema no cellulitis drainage or odor.  Peroneal tendons are tender.  Gait: Unassisted, Nonantalgic.    Radiographs:  Ankle views demonstrate some osteoarthritic changes  about the rear foot and midfoot.  She also demonstrates what appears to be a large cyst of the fibula.  He also appears to be a fracture healing nondisplaced distalmost aspect of the fibula.  I see no signs of the cyst being expansile though it is osteopenic.  There is no air-fluid level noted.  Assessment & Plan:   Assessment: Cannot rule out a bone cyst and fracture of the fibula distally.  Osteoarthritis of the right foot.  Plan: Requesting an MRI with and without contrast for evaluation and differential diagnosis and surgical consideration.  Need to rule out any type of dysplasia.     Neven Fina T. [place], [place]

## 2020-02-21 ENCOUNTER — Telehealth: Payer: Self-pay | Admitting: Family Medicine

## 2020-02-21 DIAGNOSIS — E785 Hyperlipidemia, unspecified: Secondary | ICD-10-CM

## 2020-02-21 MED ORDER — CHOLECALCIFEROL 1.25 MG (50000 UT) PO TABS: 1.0000 | ORAL_TABLET | ORAL | 0 refills | Status: DC

## 2020-02-21 MED ORDER — CALCIUM CARBONATE 600 MG PO TABS: 600.0000 mg | ORAL_TABLET | Freq: Two times a day (BID) | ORAL | 1 refills | Status: DC

## 2020-02-21 NOTE — Telephone Encounter (Signed)
Called patient to discuss labs LDL above goal Can't tolerate statin Offered referral to cardiology lipid clinic. She is agreeable. Referral entered.  Also noted low vit D. Will replete with 50k units x12 weeks then recheck Also send in calcium supplement given osteopenia  Patient appreciative.  Leeanne Rio, MD

## 2020-02-25 ENCOUNTER — Ambulatory Visit
Admission: RE | Admit: 2020-02-25 | Discharge: 2020-02-25 | Disposition: A | Discharge status: Home / Self Care | Payer: Medicare Other | Source: Ambulatory Visit | Attending: Podiatry | Admitting: Podiatry

## 2020-02-25 DIAGNOSIS — M85671 Other cyst of bone, right ankle and foot: Secondary | ICD-10-CM

## 2020-02-25 DIAGNOSIS — M19071 Primary osteoarthritis, right ankle and foot: Secondary | ICD-10-CM | POA: Diagnosis not present

## 2020-02-25 DIAGNOSIS — M25571 Pain in right ankle and joints of right foot: Secondary | ICD-10-CM

## 2020-02-25 DIAGNOSIS — S9001XA Contusion of right ankle, initial encounter: Secondary | ICD-10-CM

## 2020-02-25 DIAGNOSIS — R600 Localized edema: Secondary | ICD-10-CM | POA: Diagnosis not present

## 2020-02-25 DIAGNOSIS — M25471 Effusion, right ankle: Secondary | ICD-10-CM | POA: Diagnosis not present

## 2020-02-25 DIAGNOSIS — M7731 Calcaneal spur, right foot: Secondary | ICD-10-CM | POA: Diagnosis not present

## 2020-02-25 MED ORDER — GADOBENATE DIMEGLUMINE 529 MG/ML IV SOLN
19.0000 mL | Freq: Once | INTRAVENOUS | Status: AC | PRN
  Administered 2020-02-25: 12:00:00 19 mL via INTRAVENOUS

## 2020-02-26 ENCOUNTER — Other Ambulatory Visit: Payer: Self-pay | Admitting: Family Medicine

## 2020-02-28 ENCOUNTER — Other Ambulatory Visit: Payer: Self-pay | Admitting: Family Medicine

## 2020-03-02 ENCOUNTER — Other Ambulatory Visit: Payer: Self-pay | Admitting: *Deleted

## 2020-03-02 MED ORDER — CELECOXIB 100 MG PO CAPS: 100.0000 mg | ORAL_CAPSULE | Freq: Every day | ORAL | 0 refills | Status: DC | PRN

## 2020-03-03 ENCOUNTER — Encounter: Payer: Self-pay | Admitting: Podiatry

## 2020-03-03 ENCOUNTER — Other Ambulatory Visit: Payer: Self-pay

## 2020-03-03 ENCOUNTER — Ambulatory Visit: Payer: Medicare Other | Admitting: Podiatry

## 2020-03-03 DIAGNOSIS — Q828 Other specified congenital malformations of skin: Secondary | ICD-10-CM

## 2020-03-03 NOTE — Progress Notes (Signed)
She presents today for follow-up of her MRI. She states that her ankle is feeling essentially normal. Still has a little tenderness right on the bone as she points to the fibula.  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 With some synovitis within the ankle joint. Also is concerning for sinus tarsitis.  On physical exam today she exhibited no other symptoms other than some mild tenderness of the distal fibula itself. Much decrease in edema no erythema cellulitis drainage or odor than what I had seen before.  Assessment: Ankle sprain resolving right.  Plan: Follow-up with me on an as-needed basis remember to thank her for all of the Christmas goodies.

## 2020-03-12 ENCOUNTER — Other Ambulatory Visit: Payer: Self-pay

## 2020-03-12 ENCOUNTER — Telehealth (INDEPENDENT_AMBULATORY_CARE_PROVIDER_SITE_OTHER): Payer: Medicare Other | Admitting: Family Medicine

## 2020-03-12 DIAGNOSIS — B9689 Other specified bacterial agents as the cause of diseases classified elsewhere: Secondary | ICD-10-CM

## 2020-03-12 DIAGNOSIS — J019 Acute sinusitis, unspecified: Secondary | ICD-10-CM | POA: Diagnosis not present

## 2020-03-12 MED ORDER — AMOXICILLIN 875 MG PO TABS: 875.0000 mg | ORAL_TABLET | Freq: Two times a day (BID) | ORAL | 0 refills | Status: AC

## 2020-03-12 NOTE — Assessment & Plan Note (Signed)
Given length or symptoms and described symptoms, will go ahead and treat patient for bacterial sinusitis with amox 875mg  BID x 5 days.  No need to COVID test as this time as she it outside of quarantine window.  Advised to return to care if no improvement in her symptoms or if symptoms worsen after treatment.  She voiced understanding.

## 2020-03-12 NOTE — Progress Notes (Signed)
[facility] Telemedicine Visit  Patient consented to have virtual visit and was identified by name and date of birth. Method of visit: Telephone  Encounter participants: Patient: Tiffany Becker - located at home Provider: Unknown Jim - located at home Others (if applicable): none  Chief Complaint: congestion, swollen lymph nodes in neck  HPI:  Lymphadenopathy and congestion Left side of neck under her ear is painful Notes some swelling and thinks it is her lymph node She also has some left ear pain Feels very congested  Has been happening about a week and a half She has some tenderness when she moves that way as well  Feeling a little sinus pressure No ear drainage and nothing coming down the back of her throat She has not been tested for COVID She does not have fever, chills, sore throat, shortness of breath No known sick contacts  She is vaccinated for COVID   ROS: per HPI  Pertinent PMHx: HTN, T2DM, HLD  Exam:  There were no vitals taken for this visit.  Respiratory: speaking in complete sentences, no evidence of respiratory distress over the phone  Assessment/Plan:  Acute bacterial rhinosinusitis Given length or symptoms and described symptoms, will go ahead and treat patient for bacterial sinusitis with amox 875mg  BID x 5 days.  No need to COVID test as this time as she it outside of quarantine window.  Advised to return to care if no improvement in her symptoms or if symptoms worsen after treatment.  She voiced understanding.    Time spent during visit with patient: 8 minutes

## 2020-04-12 ENCOUNTER — Other Ambulatory Visit: Payer: Self-pay | Admitting: Family Medicine

## 2020-04-13 ENCOUNTER — Encounter: Payer: Self-pay | Admitting: Family Medicine

## 2020-04-14 ENCOUNTER — Other Ambulatory Visit: Payer: Self-pay | Admitting: Family Medicine

## 2020-04-23 ENCOUNTER — Other Ambulatory Visit: Payer: Self-pay | Admitting: Family Medicine

## 2020-05-03 ENCOUNTER — Other Ambulatory Visit: Payer: Self-pay | Admitting: Family Medicine

## 2020-05-25 ENCOUNTER — Ambulatory Visit: Payer: Medicare Other | Admitting: Internal Medicine

## 2020-05-25 ENCOUNTER — Encounter: Payer: Self-pay | Admitting: Internal Medicine

## 2020-05-25 ENCOUNTER — Other Ambulatory Visit: Payer: Self-pay

## 2020-05-25 VITALS — BP 150/71 | HR 68 | Ht 69.0 in | Wt 203.6 lb

## 2020-05-25 DIAGNOSIS — T466X5A Adverse effect of antihyperlipidemic and antiarteriosclerotic drugs, initial encounter: Secondary | ICD-10-CM

## 2020-05-25 DIAGNOSIS — Z8249 Family history of ischemic heart disease and other diseases of the circulatory system: Secondary | ICD-10-CM | POA: Diagnosis not present

## 2020-05-25 DIAGNOSIS — E119 Type 2 diabetes mellitus without complications: Secondary | ICD-10-CM

## 2020-05-25 DIAGNOSIS — M791 Myalgia, unspecified site: Secondary | ICD-10-CM

## 2020-05-25 DIAGNOSIS — E785 Hyperlipidemia, unspecified: Secondary | ICD-10-CM | POA: Diagnosis not present

## 2020-05-25 MED ORDER — ROSUVASTATIN CALCIUM 5 MG PO TABS: 5.0000 mg | ORAL_TABLET | Freq: Every day | ORAL | 3 refills | Status: DC

## 2020-05-25 NOTE — Patient Instructions (Signed)
Medication Instructions:   -Start rosuvastatin (crestor) 5mg  once daily.  *If you need a refill on your cardiac medications before your next appointment, please call your pharmacy*   Lab Work: Your physician recommends that you return for lab work in: 3 month for lipid panel.  If you have labs (blood work) drawn today and your tests are completely normal, you will receive your results only by: Marland Kitchen MyChart Message (if you have MyChart) OR . A paper copy in the mail If you have any lab test that is abnormal or we need to change your treatment, we will call you to review the results.   Testing/Procedures: Dr. Debara Pickett has ordered a CT coronary calcium score. This test is done at [address]. This is $99 out of pocket.   Coronary CalciumScan A coronary calcium scan is an imaging test used to look for deposits of calcium and other fatty materials (plaques) in the inner lining of the blood vessels of the heart (coronary arteries). These deposits of calcium and plaques can partly clog and narrow the coronary arteries without producing any symptoms or warning signs. This puts a person at risk for a heart attack. This test can detect these deposits before symptoms develop. Tell a health care provider about:  Any allergies you have.  All medicines you are taking, including vitamins, herbs, eye drops, creams, and over-the-counter medicines.  Any problems you or family members have had with anesthetic medicines.  Any blood disorders you have.  Any surgeries you have had.  Any medical conditions you have.  Whether you are pregnant or may be pregnant. What are the risks? Generally, this is a safe procedure. However, problems may occur, including:  Harm to a pregnant woman and her unborn baby. This test involves the use of radiation. Radiation exposure can be dangerous to a pregnant woman and her unborn baby. If you are pregnant, you generally should not have this procedure  done.  Slight increase in the risk of cancer. This is because of the radiation involved in the test. What happens before the procedure? No preparation is needed for this procedure. What happens during the procedure?  You will undress and remove any jewelry around your neck or chest.  You will put on a hospital gown.  Sticky electrodes will be placed on your chest. The electrodes will be connected to an electrocardiogram (ECG) machine to record a tracing of the electrical activity of your heart.  A CT scanner will take pictures of your heart. During this time, you will be asked to lie still and hold your breath for 2-3 seconds while a picture of your heart is being taken. The procedure may vary among health care providers and hospitals. What happens after the procedure?  You can get dressed.  You can return to your normal activities.  It is up to you to get the results of your test. Ask your health care provider, or the department that is doing the test, when your results will be ready. Summary  A coronary calcium scan is an imaging test used to look for deposits of calcium and other fatty materials (plaques) in the inner lining of the blood vessels of the heart (coronary arteries).  Generally, this is a safe procedure. Tell your health care provider if you are pregnant or may be pregnant.  No preparation is needed for this procedure.  A CT scanner will take pictures of your heart.  You can return to your normal activities  after the scan is done. This information is not intended to replace advice given to you by your health care provider. Make sure you discuss any questions you have with your health care provider. Document Released: 08/27/2007 Document Revised: 01/18/2016 Document Reviewed: 01/18/2016 Elsevier Interactive Patient Education  2017 [place]: At [facility], you and your health needs are our priority.  As part of our continuing mission to  provide you with exceptional heart care, we have created designated Provider Care Teams.  These Care Teams include your primary Cardiologist (physician) and Advanced Practice Providers (APPs -  Physician Assistants and Nurse Practitioners) who all work together to provide you with the care you need, when you need it.  We recommend signing up for the patient portal called "MyChart".  Sign up information is provided on this After Visit Summary.  MyChart is used to connect with patients for Virtual Visits (Telemedicine).  Patients are able to view lab/test results, encounter notes, upcoming appointments, etc.  Non-urgent messages can be sent to your provider as well.   To learn more about what you can do with MyChart, go to [email or web address].    Your next appointment:   3 month(s)  The format for your next appointment:   In Person  Provider:   K. Mali Hilty, MD   Other Instructions In the [facility].

## 2020-05-25 NOTE — Progress Notes (Signed)
LIPID CLINIC CONSULT NOTE  Chief Complaint:  Manage dyslipidemia  Primary Care Physician: Leeanne Rio, MD  Primary Cardiologist:  No primary care provider on file.  HPI:  Tiffany Becker is a 75 y.o. female who is being seen today for the evaluation of dyslipidemia at the request of Ardelia Mems, Delorse Limber, MD.  This is a pleasant 75 year old female kindly referred for evaluation of dyslipidemia.  She has a history of high cholesterol with intolerance to simvastatin and more recently a atorvastatin due to to causing symptoms of leg pain and hair loss.  She was also noted to have recent vitamin D deficiency with a level of about 24 on repletion.  She has a family history of heart disease in her father who had an MI at 22.  Recent lipids in December showed total cholesterol of 197, triglycerides 106, HDL 63 and LDL 115.  She was previously taking atorvastatin 40 mg every Monday Wednesday and Friday and decrease the dose to 20 mg 3 times a week but still had side effects.  She feels that she eats a fairly healthy diet but has been actively trying to reduce saturated fats.  PMHx:  Past Medical History:  Diagnosis Date  . Abnormal renal ultrasound 04/25/02  . Diabetes mellitus without complication ([place])   . Diverticulosis of colon 08/12/04  . GERD (gastroesophageal reflux disease)    H pylori neg 08/21/02  . Hypertension   . Peptic ulcer disease 03/14/80   EGD  . Primary osteoarthritis of both hips 4/09   Dr Mayer Camel  . Pyelonephritis 11/02  . Vitreous detachment     Past Surgical History:  Procedure Laterality Date  . BREAST REDUCTION SURGERY  03/14/93  . BUNIONECTOMY  12/09   L with 2nd and 5th toe straightening  . CERVICAL DISCECTOMY  03/14/06   C6-7  . CHOLECYSTECTOMY  01/12/89  . 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 SALPINGOOPHORECTOMY  09/11/97   ruptured cyst  . TONSILLECTOMY  03/14/52  . TOTAL ABDOMINAL HYSTERECTOMY  01/12/89   L salpingoophorectomy for ruptured cyst  . TUBAL  LIGATION  03/14/72  . UMBILICAL HERNIA REPAIR  03/14/64    FAMHx:  Family History  Problem Relation Age of Onset  . Coronary artery disease Father 71       died age 53  . Heart disease Father   . Stroke Father   . Osteoarthritis Mother   . Asthma Mother   . Heart disease Mother   . Diabetes Brother        bladder, pacemaker, Hemochromatosis  . Cancer Brother 90       Bladder    SOCHx:   reports that she has never smoked. She has never used smokeless tobacco. She reports that she does not drink alcohol and does not use drugs.  ALLERGIES:  Allergies  Allergen Reactions  . Simvastatin Anaphylaxis    REACTION: hair loss and leg pain    ROS: Pertinent items noted in HPI and remainder of comprehensive ROS otherwise negative.  HOME MEDS: Current Outpatient Medications on File Prior to Visit  Medication Sig Dispense Refill  . amLODipine (NORVASC) 5 MG tablet TAKE 1 TABLET BY MOUTH AT BEDTIME 90 tablet 2  . atorvastatin (LIPITOR) 40 MG tablet Take 1 tablet (40 mg total) by mouth every Monday, Wednesday, and Friday. 45 tablet 2  . calcium carbonate (CALCIUM 600) 600 MG TABS tablet Take 1 tablet (600 mg total) by mouth 2 (two) times daily with  a meal. 180 tablet 1  . celecoxib (CELEBREX) 100 MG capsule TAKE 1 CAPSULE BY MOUTH  DAILY AS NEEDED 90 capsule 0  . Cholecalciferol 1.25 MG (50000 UT) TABS Take 1 tablet by mouth once a week. For 12 weeks 12 tablet 0  . glucose blood (ONETOUCH VERIO) test strip Check sugar once per day 100 each 12  . glyBURIDE (DIABETA) 2.5 MG tablet TAKE 1/2 (ONE-HALF) TABLET BY MOUTH ONCE DAILY WITH BREAKFAST 45 tablet 3  . lisinopril (ZESTRIL) 40 MG tablet TAKE 1 TABLET BY MOUTH  DAILY 90 tablet 3  . metFORMIN (GLUCOPHAGE) 1000 MG tablet Take 1 tablet (1,000 mg total) by mouth 2 (two) times daily with a meal. 180 tablet 3   No current facility-administered medications on file prior to visit.    LABS/IMAGING: No results found for this or any previous visit  (from the past 48 hour(s)). No results found.  LIPID PANEL:    Component Value Date/Time   CHOL 197 02/13/2020 0835   TRIG 106 02/13/2020 0835   HDL 63 02/13/2020 0835   CHOLHDL 3.1 02/13/2020 0835   CHOLHDL 3.0 10/20/2014 0941   VLDL 25 10/20/2014 0941   LDLCALC 115 (H) 02/13/2020 0835    WEIGHTS: Wt Readings from Last 3 Encounters:  05/25/20 203 lb 9.6 oz (92.4 kg)  02/04/20 202 lb (91.6 kg)  05/20/19 209 lb (94.8 kg)    VITALS: BP (!) 150/71   Pulse 68   Ht 5\' 9"  (1.753 m)   Wt 203 lb 9.6 oz (92.4 kg)   SpO2 98%   BMI 30.07 kg/m   EXAM: General appearance: alert and no distress Neck: no carotid bruit, no JVD and thyroid not enlarged, symmetric, no tenderness/mass/nodules Lungs: clear to auscultation bilaterally Heart: regular rate and rhythm, S1, S2 normal, no murmur, click, rub or gallop Abdomen: soft, non-tender; bowel sounds normal; no masses,  no organomegaly Extremities: extremities normal, atraumatic, no cyanosis or edema Pulses: 2+ and symmetric Skin: Skin color, texture, turgor normal. No rashes or lesions Neurologic: Grossly normal Psych: Pleasant  *Examination chaperoned by Laren Boom, RN.  EKG: Deferred  ASSESSMENT: 1. Mixed dyslipidemia 2. DM2 3. Hypertension 4. Family history of premature CAD in father 21. Statin intolerance -myalgias  PLAN: 1.   Tiffany Becker has a mixed dyslipidemia and I would submit her target LDL should be less than 70 based on diabetes and multiple risk factors.  Unfortunately she cannot tolerate both rosuvastatin and simvastatin.  She might be able to tolerate low-dose rosuvastatin I would recommend 5 mg daily.  We will also get a calcium score to better risk stratify her.  If this is significantly elevated, I would consider more intense therapy to target her LDL lower.  Ultimately she might be a candidate for PCSK9 inhibitor.  Thanks for the kind referral.  Follow-up with me in 3 to 4 months with repeat  lipids.  Pixie Casino, MD, [facility], [place] Director of the Advanced Lipid Disorders &  Cardiovascular Risk Reduction Clinic Diplomate of the American Board of Clinical Lipidology Attending Cardiologist  Direct Dial: [phone]  Fax: [phone]  Website:  [email or web address] 05/25/2020, 2:55 PM

## 2020-06-11 ENCOUNTER — Encounter: Payer: Self-pay | Admitting: Family Medicine

## 2020-06-11 ENCOUNTER — Other Ambulatory Visit: Payer: Self-pay

## 2020-06-11 ENCOUNTER — Ambulatory Visit (INDEPENDENT_AMBULATORY_CARE_PROVIDER_SITE_OTHER): Payer: Medicare Other | Admitting: Family Medicine

## 2020-06-11 VITALS — BP 154/80 | HR 69 | Wt 201.4 lb

## 2020-06-11 DIAGNOSIS — E785 Hyperlipidemia, unspecified: Secondary | ICD-10-CM | POA: Diagnosis not present

## 2020-06-11 DIAGNOSIS — M545 Low back pain, unspecified: Secondary | ICD-10-CM | POA: Diagnosis not present

## 2020-06-11 DIAGNOSIS — E559 Vitamin D deficiency, unspecified: Secondary | ICD-10-CM

## 2020-06-11 DIAGNOSIS — E119 Type 2 diabetes mellitus without complications: Secondary | ICD-10-CM

## 2020-06-11 DIAGNOSIS — N390 Urinary tract infection, site not specified: Secondary | ICD-10-CM

## 2020-06-11 LAB — POCT GLYCOSYLATED HEMOGLOBIN (HGB A1C): HbA1c, POC (controlled diabetic range): 6.4 % (ref 0.0–7.0)

## 2020-06-11 LAB — POCT URINALYSIS DIP (MANUAL ENTRY)
Bilirubin, UA: NEGATIVE
Blood, UA: NEGATIVE
Glucose, UA: NEGATIVE mg/dL
Ketones, POC UA: NEGATIVE mg/dL
Leukocytes, UA: NEGATIVE
Nitrite, UA: NEGATIVE
Protein Ur, POC: NEGATIVE mg/dL
Spec Grav, UA: 1.02 (ref 1.010–1.025)
Urobilinogen, UA: 0.2 E.U./dL
pH, UA: 6.5 (ref 5.0–8.0)

## 2020-06-11 MED ORDER — CEPHALEXIN 500 MG PO CAPS: 500.0000 mg | ORAL_CAPSULE | Freq: Two times a day (BID) | ORAL | 0 refills | Status: DC

## 2020-06-11 NOTE — Progress Notes (Signed)
error 

## 2020-06-11 NOTE — Assessment & Plan Note (Signed)
Pt has tolerated Glyburide and Metformin well. Reports eating well and drinking only water.  Plan: A1C improved to 6.4 today  Continue Glyburide and Metformin

## 2020-06-11 NOTE — Assessment & Plan Note (Addendum)
Pt has not tolerated 5mg  Crestor daily with re-emergence of muscle cramps and hallucis deviation.   Plan: Okay to stop 5mg  Crestor due to intolerance Follow up with Cardiology and scheduled 06/30/20 CT Cardiac Calcium Scoring exam

## 2020-06-11 NOTE — Patient Instructions (Addendum)
Culturing urine  Checking vitamin D  A1c today  Sent in keflex for your urine  Follow up in 3 months  Be well, Dr. Ardelia Mems

## 2020-06-11 NOTE — Assessment & Plan Note (Signed)
Vitamin D was low in December 2021. Pt has been taking PO replacement.  Plan: Collect Vitamin D lab today Will refill Vitamin D

## 2020-06-11 NOTE — Progress Notes (Signed)
    SUBJECTIVE:   CHIEF COMPLAINT / HPI:   Tiffany Becker presents today with concern for a UTI.  UTI Concern The pt notes that she began experiencing some right sided flank pain and noticed an unusual odor to her urine for the last 1.5-2 weeks. She notes that this is her typical symptom presentation for a UTI. Her last UTI was more than a year ago. She denies burning, itching, fevers, urinary frequency, urgency, suprapubic pain, headaches, nausea, or vomiting. She notes that she has never had any of these classic symptoms with her previous UTIs.  Hyperlipidemia The pt has not been able to tolerate previous statin therapy due to bothersome leg cramps. She saw Cardiology a few weeks ago and began 5mg  Crestor daily. She notes that she has begun to experience renewed leg cramps, which persisted throughout the night last night. She will receive a CT Cardiac Calcium Scoring exam on 06/30/20 arranged by cardiology.  Diabetes - taking glyburide half of a 2.5mg  tablet daily, and metformin 1000mg  twice daily. Tolerating well. No low sugars.  OBJECTIVE:   BP (!) 154/80   Pulse 69   Wt 201 lb 6.4 oz (91.4 kg)   SpO2 97%   BMI 29.74 kg/m   Physical Exam Vitals reviewed.  Constitutional:      General: She is not in acute distress.    Appearance: Normal appearance. She is not ill-appearing, toxic-appearing or diaphoretic.  HENT:     Head: Normocephalic and atraumatic.  Cardiovascular:     Rate and Rhythm: Normal rate and regular rhythm.     Pulses: Normal pulses.     Heart sounds: Normal heart sounds.  Pulmonary:     Effort: Pulmonary effort is normal.     Breath sounds: Normal breath sounds.  Abdominal:     Tenderness: There is no abdominal tenderness. There is no right CVA tenderness or left CVA tenderness.  Skin:    General: Skin is warm and dry.  Neurological:     Mental Status: She is alert and oriented to person, place, and time.      ASSESSMENT/PLAN:   Vitamin D  deficiency Vitamin D was low in December 2021. Pt has been taking PO replacement.  Plan: Collect Vitamin D lab today Will refill Vitamin D  Dyslipidemia Pt has not tolerated 5mg  Crestor daily with re-emergence of muscle cramps and hallucis deviation.   Plan: Okay to stop 5mg  Crestor due to intolerance Follow up with Cardiology and scheduled 06/30/20 CT Cardiac Calcium Scoring exam  Diabetes mellitus without complication ([place]) Pt has tolerated Glyburide and Metformin well. Reports eating well and drinking only water.  Plan: A1C improved to 6.4 today  Continue Glyburide and Metformin  UTI (urinary tract infection) Pt endorses symptoms consistent with previous UTIs including right flank pain and foul urinary odor. Denies fevers, frequency, urgency, or burning. Well appearing today. UA unremarkable but given history similar to prior UTIs will empirically treat with keflex and culture urine.    [place]    Patient seen along with MS3 student [place]. I personally evaluated this patient along with the student, and verified all aspects of the history, physical exam, and medical decision making as documented by the student. I agree with the student's documentation and have made all necessary edits.  Chrisandra Netters, MD  [place]

## 2020-06-11 NOTE — Assessment & Plan Note (Addendum)
Pt endorses symptoms consistent with previous UTIs including right flank pain and foul urinary odor. Denies fevers, frequency, urgency, or burning. Well appearing today. UA unremarkable but given history similar to prior UTIs will empirically treat with keflex and culture urine.

## 2020-06-12 LAB — VITAMIN D 25 HYDROXY (VIT D DEFICIENCY, FRACTURES): Vit D, 25-Hydroxy: 41.9 ng/mL (ref 30.0–100.0)

## 2020-06-15 ENCOUNTER — Encounter: Payer: Self-pay | Admitting: Family Medicine

## 2020-06-15 LAB — URINE CULTURE

## 2020-06-30 ENCOUNTER — Ambulatory Visit (INDEPENDENT_AMBULATORY_CARE_PROVIDER_SITE_OTHER)
Admission: RE | Admit: 2020-06-30 | Discharge: 2020-06-30 | Disposition: A | Discharge status: Home / Self Care | Payer: Self-pay | Source: Ambulatory Visit | Attending: Internal Medicine | Admitting: Internal Medicine

## 2020-06-30 ENCOUNTER — Other Ambulatory Visit: Payer: Self-pay

## 2020-06-30 DIAGNOSIS — E785 Hyperlipidemia, unspecified: Secondary | ICD-10-CM

## 2020-07-06 ENCOUNTER — Other Ambulatory Visit: Payer: Self-pay | Admitting: *Deleted

## 2020-07-06 ENCOUNTER — Encounter: Payer: Self-pay | Admitting: *Deleted

## 2020-07-06 MED ORDER — REPATHA SURECLICK 140 MG/ML ~~LOC~~ SOAJ: 1.0000 | SUBCUTANEOUS | 11 refills | Status: DC

## 2020-07-07 ENCOUNTER — Telehealth: Payer: Self-pay | Admitting: Internal Medicine

## 2020-07-07 NOTE — Telephone Encounter (Signed)
PA for repatha sureclick submitted via CMM (Key: [id number])

## 2020-07-09 NOTE — Telephone Encounter (Signed)
Request Reference Number: [id number]. REPATHA INJ 140MG /ML is approved through 01/06/2021.

## 2020-07-10 ENCOUNTER — Telehealth: Payer: Self-pay | Admitting: Internal Medicine

## 2020-07-10 NOTE — Telephone Encounter (Signed)
Patient applied for [employer] - approved 06/09/20 -- 06/08/21  ID: [id number] Pharmacy Card ID: [id number] PC Group: [id number] PC BIN: [id number] PC PCN: [id number]

## 2020-07-14 ENCOUNTER — Other Ambulatory Visit: Payer: Self-pay | Admitting: Family Medicine

## 2020-07-15 DIAGNOSIS — M1711 Unilateral primary osteoarthritis, right knee: Secondary | ICD-10-CM | POA: Diagnosis not present

## 2020-07-15 DIAGNOSIS — M25511 Pain in right shoulder: Secondary | ICD-10-CM | POA: Diagnosis not present

## 2020-08-14 ENCOUNTER — Telehealth: Payer: Self-pay

## 2020-08-14 MED ORDER — SULFAMETHOXAZOLE-TRIMETHOPRIM 800-160 MG PO TABS: 1.0000 | ORAL_TABLET | Freq: Two times a day (BID) | ORAL | 0 refills | Status: AC

## 2020-08-14 NOTE — Telephone Encounter (Signed)
Sent in rx for bactrim, twice a day for 5 days Please let patient know If she has recurrent symptoms after treatment with bactrim, she will need to come in to give a urine sample so we can reculture her urine.  Thanks Leeanne Rio, MD

## 2020-08-14 NOTE — Telephone Encounter (Signed)
Patient calls nurse line regarding UTI symptoms. Patient reports pain in the right lower back and malodorous urine.   Denies dysuria but reports irritation. Denies fever or abdominal pain.   Patient was treated for UTI on 3/31 with Keflex. Patient reports that symptoms decreased, however, never fully went away. Patient is requesting additional antibiotics be sent into pharmacy.   UC/ ED precautions given.   Please advise.   Talbot Grumbling, RN

## 2020-08-17 DIAGNOSIS — M1711 Unilateral primary osteoarthritis, right knee: Secondary | ICD-10-CM | POA: Diagnosis not present

## 2020-08-24 DIAGNOSIS — M1711 Unilateral primary osteoarthritis, right knee: Secondary | ICD-10-CM | POA: Diagnosis not present

## 2020-08-31 DIAGNOSIS — M1711 Unilateral primary osteoarthritis, right knee: Secondary | ICD-10-CM | POA: Diagnosis not present

## 2020-09-01 ENCOUNTER — Ambulatory Visit: Payer: Medicare Other | Admitting: Internal Medicine

## 2020-09-16 DIAGNOSIS — D225 Melanocytic nevi of trunk: Secondary | ICD-10-CM | POA: Diagnosis not present

## 2020-09-16 DIAGNOSIS — L821 Other seborrheic keratosis: Secondary | ICD-10-CM | POA: Diagnosis not present

## 2020-09-16 DIAGNOSIS — Z1283 Encounter for screening for malignant neoplasm of skin: Secondary | ICD-10-CM | POA: Diagnosis not present

## 2020-10-22 DIAGNOSIS — Z0100 Encounter for examination of eyes and vision without abnormal findings: Secondary | ICD-10-CM | POA: Diagnosis not present

## 2020-10-22 DIAGNOSIS — E119 Type 2 diabetes mellitus without complications: Secondary | ICD-10-CM | POA: Diagnosis not present

## 2020-10-22 DIAGNOSIS — E785 Hyperlipidemia, unspecified: Secondary | ICD-10-CM | POA: Diagnosis not present

## 2020-10-22 LAB — LIPID PANEL
Chol/HDL Ratio: 2.3 ratio (ref 0.0–4.4)
Cholesterol, Total: 136 mg/dL (ref 100–199)
HDL: 58 mg/dL (ref >39)
LDL Chol Calc (NIH): 63 mg/dL (ref 0–99)
Triglycerides: 74 mg/dL (ref 0–149)
VLDL Cholesterol Cal: 15 mg/dL (ref 5–40)

## 2020-10-28 ENCOUNTER — Other Ambulatory Visit: Payer: Self-pay

## 2020-10-28 ENCOUNTER — Encounter: Payer: Self-pay | Admitting: Internal Medicine

## 2020-10-28 ENCOUNTER — Ambulatory Visit: Payer: Medicare Other | Admitting: Internal Medicine

## 2020-10-28 VITALS — BP 158/62 | HR 74 | Ht 69.0 in | Wt 198.6 lb

## 2020-10-28 DIAGNOSIS — T466X5D Adverse effect of antihyperlipidemic and antiarteriosclerotic drugs, subsequent encounter: Secondary | ICD-10-CM

## 2020-10-28 DIAGNOSIS — E785 Hyperlipidemia, unspecified: Secondary | ICD-10-CM

## 2020-10-28 DIAGNOSIS — E119 Type 2 diabetes mellitus without complications: Secondary | ICD-10-CM | POA: Diagnosis not present

## 2020-10-28 DIAGNOSIS — M791 Myalgia, unspecified site: Secondary | ICD-10-CM

## 2020-10-28 DIAGNOSIS — Z79899 Other long term (current) drug therapy: Secondary | ICD-10-CM | POA: Diagnosis not present

## 2020-10-28 DIAGNOSIS — T466X5A Adverse effect of antihyperlipidemic and antiarteriosclerotic drugs, initial encounter: Secondary | ICD-10-CM

## 2020-10-28 NOTE — Progress Notes (Signed)
LIPID CLINIC CONSULT NOTE  Chief Complaint:  Manage dyslipidemia  Primary Care Physician: Leeanne Rio, MD  Primary Cardiologist:  None  HPI:  Tiffany Becker is a 75 y.o. female who is being seen today for the evaluation of dyslipidemia at the request of Ardelia Mems Delorse Limber, MD.  This is a pleasant 75 year old female kindly referred for evaluation of dyslipidemia.  She has a history of high cholesterol with intolerance to simvastatin and more recently a atorvastatin due to to causing symptoms of leg pain and hair loss.  She was also noted to have recent vitamin D deficiency with a level of about 24 on repletion.  She has a family history of heart disease in her father who had an MI at 87.  Recent lipids in December showed total cholesterol of 197, triglycerides 106, HDL 63 and LDL 115.  She was previously taking atorvastatin 40 mg every Monday Wednesday and Friday and decrease the dose to 20 mg 3 times a week but still had side effects.  She feels that she eats a fairly healthy diet but has been actively trying to reduce saturated fats.  10/28/2020  Tiffany Becker returns today for follow-up.  She has had significant improvement in her lipids.  Total cholesterol now 136, HDL 58, triglycerides 74 and LDL 63.  This apparently is on predominantly 5 mg of rosuvastatin.  She was also approved for Repatha however was concerned about side effects of the medication.  Although she did get the prescription filled and qualified for a health well grant for which she got $2500 to use for the year, at this point he does not appear that she needs the medication.  She is also made some dietary changes.  She has reached target LDL less than 70.  PMHx:  Past Medical History:  Diagnosis Date   Abnormal renal ultrasound 04/25/02   Diabetes mellitus without complication ([place])    Diverticulosis of colon 08/12/04   GERD (gastroesophageal reflux disease)    H pylori neg 08/21/02   Hypertension    Peptic  ulcer disease 03/14/80   EGD   Primary osteoarthritis of both hips 4/09   Dr Mayer Camel   Pyelonephritis 11/02   Vitreous detachment     Past Surgical History:  Procedure Laterality Date   BREAST REDUCTION SURGERY  03/14/93   BUNIONECTOMY  12/09   L with 2nd and 5th toe straightening   CERVICAL DISCECTOMY  03/14/06   C6-7   CHOLECYSTECTOMY  01/12/89   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   SALPINGOOPHORECTOMY  09/11/97   ruptured cyst   TONSILLECTOMY  03/14/52   TOTAL ABDOMINAL HYSTERECTOMY  01/12/89   L salpingoophorectomy for ruptured cyst   TUBAL LIGATION  [id number]   UMBILICAL HERNIA REPAIR  03/14/64    FAMHx:  Family History  Problem Relation Age of Onset   Coronary artery disease Father 43       died age 27   Heart disease Father    Stroke Father    Osteoarthritis Mother    Asthma Mother    Heart disease Mother    Diabetes Brother        bladder, pacemaker, Hemochromatosis   Cancer Brother 94       Bladder    SOCHx:   reports that she has never smoked. She has never used smokeless tobacco. She reports that she does not drink alcohol and does not use drugs.  ALLERGIES:  Allergies  Allergen Reactions   Rosuvastatin  Other (See Comments)    myalgias   Simvastatin Other (See Comments)    REACTION: hair loss and leg pain    ROS: Pertinent items noted in HPI and remainder of comprehensive ROS otherwise negative.  HOME MEDS: Current Outpatient Medications on File Prior to Visit  Medication Sig Dispense Refill   amLODipine (NORVASC) 5 MG tablet TAKE 1 TABLET BY MOUTH AT BEDTIME 90 tablet 2   calcium carbonate (CALCIUM 600) 600 MG TABS tablet Take 1 tablet (600 mg total) by mouth 2 (two) times daily with a meal. 180 tablet 1   celecoxib (CELEBREX) 100 MG capsule TAKE 1 CAPSULE BY MOUTH  DAILY AS NEEDED 90 capsule 3   Cholecalciferol 1.25 MG (50000 UT) TABS Take 1 tablet by mouth once a week. For 12 weeks 12 tablet 0   glucose blood (ONETOUCH VERIO) test strip Check sugar once per  day 100 each 12   glyBURIDE (DIABETA) 2.5 MG tablet TAKE 1/2 (ONE-HALF) TABLET BY MOUTH ONCE DAILY WITH BREAKFAST 45 tablet 3   lisinopril (ZESTRIL) 40 MG tablet TAKE 1 TABLET BY MOUTH  DAILY 90 tablet 3   metFORMIN (GLUCOPHAGE) 1000 MG tablet Take 1 tablet (1,000 mg total) by mouth 2 (two) times daily with a meal. 180 tablet 3   rosuvastatin (CRESTOR) 5 MG tablet Take 1 tablet (5 mg total) by mouth daily. 90 tablet 3   Evolocumab (REPATHA SURECLICK) [id number] MG/ML SOAJ Inject 1 Dose into the skin every 14 (fourteen) days. (Patient not taking: Reported on 10/28/2020) 2 mL 11   No current facility-administered medications on file prior to visit.    LABS/IMAGING: No results found for this or any previous visit (from the past 48 hour(s)). No results found.  LIPID PANEL:    Component Value Date/Time   CHOL 136 10/22/2020 0834   TRIG 74 10/22/2020 0834   HDL 58 10/22/2020 0834   CHOLHDL 2.3 10/22/2020 0834   CHOLHDL 3.0 10/20/2014 0941   VLDL 25 10/20/2014 0941   LDLCALC 63 10/22/2020 0834    WEIGHTS: Wt Readings from Last 3 Encounters:  10/28/20 198 lb 9.6 oz (90.1 kg)  06/11/20 201 lb 6.4 oz (91.4 kg)  05/25/20 203 lb 9.6 oz (92.4 kg)    VITALS: BP (!) 158/62   Pulse 74   Ht '5\' 9"'$  (1.753 m)   Wt 198 lb 9.6 oz (90.1 kg)   SpO2 96%   BMI 29.33 kg/m   EXAM: General appearance: alert and no distress Neck: no carotid bruit, no JVD and thyroid not enlarged, symmetric, no tenderness/mass/nodules Lungs: clear to auscultation bilaterally Heart: regular rate and rhythm, S1, S2 normal, no murmur, click, rub or gallop Abdomen: soft, non-tender; bowel sounds normal; no masses,  no organomegaly Extremities: extremities normal, atraumatic, no cyanosis or edema Pulses: 2+ and symmetric Skin: Skin color, texture, turgor normal. No rashes or lesions Neurologic: Grossly normal Psych: Pleasant  EKG: Deferred  ASSESSMENT: Mixed dyslipidemia DM2 Hypertension Family history of premature  CAD in father Statin intolerance -myalgias  PLAN: 1.   Tiffany Becker has reached target LDL cholesterol on 5 mg rosuvastatin.  Although she may have some mild side effects with this she thinks it is well enough tolerated that she will stay on it.  She has reached her targets.  She is not currently on a PCSK9 inhibitor.  I encouraged her to reach out to health well to see if they could refund the money on her health well card.  Plan repeat lipids and  follow-up with me in 6 months or sooner as necessary.  Pixie Casino, MD, [facility], [place] Director of the Advanced Lipid Disorders &  Cardiovascular Risk Reduction Clinic Diplomate of the American Board of Clinical Lipidology Attending Cardiologist  Direct Dial: [phone]  Fax: [phone]  Website:  [email or web address] 10/28/2020, 3:10 PM

## 2020-10-28 NOTE — Patient Instructions (Signed)
Medication Instructions:  Your Physician recommend you continue on your current medication as directed.    *If you need a refill on your cardiac medications before your next appointment, please call your pharmacy*   Lab Work: Your physician recommends that you return for lab work prior to 6 month follow up appointment (fasting lipid).  If you have labs (blood work) drawn today and your tests are completely normal, you will receive your results only by: [place] (if you have MyChart) OR A paper copy in the mail If you have any lab test that is abnormal or we need to change your treatment, we will call you to review the results.   Testing/Procedures: None ordered today   Follow-Up: At [facility], you and your health needs are our priority.  As part of our continuing mission to provide you with exceptional heart care, we have created designated Provider Care Teams.  These Care Teams include your primary Cardiologist (physician) and Advanced Practice Providers (APPs -  Physician Assistants and Nurse Practitioners) who all work together to provide you with the care you need, when you need it.  We recommend signing up for the patient portal called "MyChart".  Sign up information is provided on this After Visit Summary.  MyChart is used to connect with patients for Virtual Visits (Telemedicine).  Patients are able to view lab/test results, encounter notes, upcoming appointments, etc.  Non-urgent messages can be sent to your provider as well.   To learn more about what you can do with MyChart, go to [email or web address].    Your next appointment:   6 month(s)  The format for your next appointment:   In Person  Provider:   K. Mali Hilty, MD

## 2020-11-02 DIAGNOSIS — M1712 Unilateral primary osteoarthritis, left knee: Secondary | ICD-10-CM | POA: Diagnosis not present

## 2020-11-02 DIAGNOSIS — M25522 Pain in left elbow: Secondary | ICD-10-CM | POA: Diagnosis not present

## 2020-11-02 DIAGNOSIS — M19011 Primary osteoarthritis, right shoulder: Secondary | ICD-10-CM | POA: Diagnosis not present

## 2020-12-25 DIAGNOSIS — Z1231 Encounter for screening mammogram for malignant neoplasm of breast: Secondary | ICD-10-CM | POA: Diagnosis not present

## 2021-01-07 ENCOUNTER — Encounter: Payer: Self-pay | Admitting: Family Medicine

## 2021-01-13 DIAGNOSIS — H2513 Age-related nuclear cataract, bilateral: Secondary | ICD-10-CM | POA: Diagnosis not present

## 2021-01-13 DIAGNOSIS — H40013 Open angle with borderline findings, low risk, bilateral: Secondary | ICD-10-CM | POA: Diagnosis not present

## 2021-01-13 LAB — HM DIABETES EYE EXAM

## 2021-01-18 ENCOUNTER — Telehealth: Payer: Self-pay

## 2021-01-18 NOTE — Telephone Encounter (Signed)
Patient calls nurse line regarding patient having cough, congestion and ear pain. States that cough is productive with yellow mucus. Denies fever and reports negative home COVID test.   Patient is requesting antibiotics. Advised that appointment is generally needed prior to receiving antibiotics. Patient states she is unable to come in for appointment at this time.   Please advise.   Talbot Grumbling, RN

## 2021-01-18 NOTE — Telephone Encounter (Signed)
Needs evaluation, can do virtual visit if needed. I believe my 8:30 and 8:50 slots for tomorrow AM will be opening up in a few minutes. Can you see if patient and her husband can do a virtual visit then? I'll place the slots on hold in the meantime.  Thanks Leeanne Rio, MD

## 2021-01-19 ENCOUNTER — Telehealth (INDEPENDENT_AMBULATORY_CARE_PROVIDER_SITE_OTHER): Payer: Medicare Other | Admitting: Family Medicine

## 2021-01-19 DIAGNOSIS — J069 Acute upper respiratory infection, unspecified: Secondary | ICD-10-CM | POA: Diagnosis not present

## 2021-01-19 MED ORDER — OSELTAMIVIR PHOSPHATE 75 MG PO CAPS: 75.0000 mg | ORAL_CAPSULE | Freq: Two times a day (BID) | ORAL | 0 refills | Status: AC

## 2021-01-19 NOTE — Progress Notes (Signed)
[place] Telemedicine Visit  Patient consented to have virtual visit and was identified by name and date of birth. Method of visit: Video was attempted but was interrupted: <50% of visit completed via video  Encounter participants: Patient: Tiffany Becker - located at home Provider: Chrisandra Netters - located at office Others (if applicable): n/a  Chief Complaint: cough/congestion/sick  HPI:  Patient seen today virtually due to respiratory symptoms, as patient stated she could not come in to the office. Her video was not working so we had audio only visit.  She started feeling sick on Friday.  Saturday reports a "fever of 98".  Denies body aches but just feels very very tired.  Throat has felt irritated and has been coughing.  Sinuses are congested.  Not eating much but is drinking well.  Checking sugars at home and they are in the 140s.  Saturday night she vomited and had diarrhea but none since then.  She took a home COVID test which was negative.  Denies any sick contacts, except for her husband who has similar symptoms though not as severe.  Denies shortness of breath.  She has tried Mucinex DM, Xyzal, Vicks sinus rub.  Does think she has turned the corner some.  Initially her call us was to request antibiotics.  ROS: per HPI  Pertinent PMHx: diabetes, hyperlipidemia, hypertension   Exam:  Respiratory: Patient speaking normally in full sentences throughout the encounter, without any respiratory distress evident.   Assessment/Plan:  Viral URI Symptoms consistent with viral URI. Given severity of symptoms and active flu in our community, suspect may have influenza. Home COVID test was negative. Reviewed that antibiotics are not indicated for viral infections. Discussed options for treatment including empiric treatment with tamiflu. Patient qualifies for tamiflu therapy despite >48 hours of symptoms due to her age and comorbidities. After discussion she opted  to be treated with tamiflu, rx sent in. Discussed other supportive care measures including hydration, afrin for nasal congestion for 3 d max, using robitussin for cough relief, etc. Discussed return precautions and when to call us back. Also discussed potential for lingering postviral cough lasting several weeks.  Time spent during visit with patient: 8 minutes

## 2021-01-20 ENCOUNTER — Encounter: Payer: Self-pay | Admitting: Family Medicine

## 2021-01-21 NOTE — Telephone Encounter (Signed)
Patient returns call to nurse line regarding persistent cough. States that cough and congestion have been keeping her up at night and she is "miserable"   Patient states that she was told to call back for additional medication if cough had not improved.   Please advise.   Talbot Grumbling, RN

## 2021-01-22 MED ORDER — BENZONATATE 100 MG PO CAPS: 100.0000 mg | ORAL_CAPSULE | Freq: Two times a day (BID) | ORAL | 0 refills | Status: DC | PRN

## 2021-01-22 NOTE — Telephone Encounter (Signed)
Called patient. She reports cough is mildly improving. Still prefers to have cough medicine sent in.  Rx for tessalon perles sent in. Advised of risk to children. Patient reports no kids come into her home.  Patient appreciative  Leeanne Rio, MD

## 2021-02-16 ENCOUNTER — Other Ambulatory Visit: Payer: Self-pay | Admitting: Family Medicine

## 2021-02-16 NOTE — Telephone Encounter (Signed)
Please disregard the previous request, I see where it was documented that she takes 1/2 of 2.5 mg. Med has been refilled. Thanks.

## 2021-02-16 NOTE — Telephone Encounter (Signed)
Hello team,   Please reach out to patient and advise that the documented Glyburide dose does not correlate with the refill requested. I have given her a limited supply so she can come in to be evaluated and adjust medication as needed. Thanks

## 2021-02-17 ENCOUNTER — Other Ambulatory Visit: Payer: Self-pay | Admitting: Family Medicine

## 2021-02-18 NOTE — Telephone Encounter (Signed)
Called and spoke to patient's husband who I left message with for patient to call [facility] and make appointment.  Ozella Almond, [place]

## 2021-02-18 NOTE — Telephone Encounter (Signed)
Please call patient and schedule for visit with any provider in next 1-2 months, needs labs (last kidney function over a year ago). Dorris Singh, MD  Family Medicine Teaching Service

## 2021-03-01 ENCOUNTER — Other Ambulatory Visit (INDEPENDENT_AMBULATORY_CARE_PROVIDER_SITE_OTHER): Payer: Medicare Other

## 2021-03-01 ENCOUNTER — Telehealth: Payer: Self-pay

## 2021-03-01 ENCOUNTER — Telehealth: Payer: Self-pay | Admitting: Family Medicine

## 2021-03-01 ENCOUNTER — Other Ambulatory Visit: Payer: Self-pay

## 2021-03-01 DIAGNOSIS — N390 Urinary tract infection, site not specified: Secondary | ICD-10-CM | POA: Diagnosis not present

## 2021-03-01 LAB — POCT URINALYSIS DIP (MANUAL ENTRY)
Bilirubin, UA: NEGATIVE
Blood, UA: NEGATIVE
Glucose, UA: NEGATIVE mg/dL
Ketones, POC UA: NEGATIVE mg/dL
Nitrite, UA: POSITIVE — AB
Protein Ur, POC: NEGATIVE mg/dL
Spec Grav, UA: 1.015 (ref 1.010–1.025)
Urobilinogen, UA: 0.2 E.U./dL
pH, UA: 6 (ref 5.0–8.0)

## 2021-03-01 LAB — POCT UA - MICROSCOPIC ONLY

## 2021-03-01 MED ORDER — CEPHALEXIN 500 MG PO CAPS: 500.0000 mg | ORAL_CAPSULE | Freq: Three times a day (TID) | ORAL | 0 refills | Status: DC

## 2021-03-01 NOTE — Telephone Encounter (Signed)
Patient calls nurse line requesting to come in for urine culture for possible UTI. Patient reports that she is having R flank pain and malodorous urine. Denies abdominal pain or fever. Symptom onset approx one week.   Patient reports that PCP usually lets her come in for UA and culture without OV.  Please advise.   Talbot Grumbling, RN

## 2021-03-01 NOTE — Telephone Encounter (Signed)
I put in future orders  Thanks  [place]

## 2021-03-01 NOTE — Telephone Encounter (Signed)
Patient called and scheduled for lab visit for UA and culture.   Talbot Grumbling, RN

## 2021-03-01 NOTE — Telephone Encounter (Signed)
Having her usual foul smell and R back pain that she always gets with UTIs No fever or nausea and vomiting  Sent in cephalexin  She is to contact us if worsenig or not better in 2 days  Has appointment with Dr Owens Shark in Jan 16

## 2021-03-05 LAB — URINE CULTURE

## 2021-03-11 DIAGNOSIS — Z01818 Encounter for other preprocedural examination: Secondary | ICD-10-CM | POA: Diagnosis not present

## 2021-03-11 DIAGNOSIS — H2511 Age-related nuclear cataract, right eye: Secondary | ICD-10-CM | POA: Diagnosis not present

## 2021-03-11 DIAGNOSIS — H2513 Age-related nuclear cataract, bilateral: Secondary | ICD-10-CM | POA: Diagnosis not present

## 2021-03-25 DIAGNOSIS — Z7984 Long term (current) use of oral hypoglycemic drugs: Secondary | ICD-10-CM | POA: Diagnosis not present

## 2021-03-25 DIAGNOSIS — E1136 Type 2 diabetes mellitus with diabetic cataract: Secondary | ICD-10-CM | POA: Diagnosis not present

## 2021-03-25 DIAGNOSIS — H25811 Combined forms of age-related cataract, right eye: Secondary | ICD-10-CM | POA: Diagnosis not present

## 2021-03-26 ENCOUNTER — Other Ambulatory Visit: Payer: Self-pay | Admitting: Family Medicine

## 2021-03-29 ENCOUNTER — Other Ambulatory Visit: Payer: Self-pay

## 2021-03-29 ENCOUNTER — Ambulatory Visit (INDEPENDENT_AMBULATORY_CARE_PROVIDER_SITE_OTHER): Payer: Medicare Other | Admitting: Family Medicine

## 2021-03-29 ENCOUNTER — Encounter: Payer: Self-pay | Admitting: Family Medicine

## 2021-03-29 VITALS — BP 130/68 | HR 68 | Ht 69.0 in | Wt 196.0 lb

## 2021-03-29 DIAGNOSIS — E785 Hyperlipidemia, unspecified: Secondary | ICD-10-CM

## 2021-03-29 DIAGNOSIS — N390 Urinary tract infection, site not specified: Secondary | ICD-10-CM | POA: Diagnosis not present

## 2021-03-29 DIAGNOSIS — J069 Acute upper respiratory infection, unspecified: Secondary | ICD-10-CM

## 2021-03-29 DIAGNOSIS — E119 Type 2 diabetes mellitus without complications: Secondary | ICD-10-CM | POA: Diagnosis not present

## 2021-03-29 DIAGNOSIS — E1169 Type 2 diabetes mellitus with other specified complication: Secondary | ICD-10-CM | POA: Diagnosis not present

## 2021-03-29 DIAGNOSIS — I10 Essential (primary) hypertension: Secondary | ICD-10-CM | POA: Diagnosis not present

## 2021-03-29 DIAGNOSIS — R058 Other specified cough: Secondary | ICD-10-CM

## 2021-03-29 LAB — POCT GLYCOSYLATED HEMOGLOBIN (HGB A1C): HbA1c, POC (controlled diabetic range): 7.2 % — AB (ref 0.0–7.0)

## 2021-03-29 MED ORDER — METFORMIN HCL ER (MOD) 1000 MG PO TB24: 1000.0000 mg | ORAL_TABLET | Freq: Every day | ORAL | 3 refills | Status: DC

## 2021-03-29 MED ORDER — SHINGRIX 50 MCG/0.5ML IM SUSR: 0.5000 mL | INTRAMUSCULAR | 0 refills | Status: DC

## 2021-03-29 NOTE — Patient Instructions (Addendum)
It was wonderful to see you today.  Please bring ALL of your medications with you to every visit.   Today we talked about:  I will  send your results via your preferred method  I sent in long acting metformin    An x-ray was ordered for you---you do not need an appointment to have this completed.  I recommend going to [facility]   Thank you for choosing [facility].   Please call [phone] with any questions about today's appointment.  Please be sure to schedule follow up at the front  desk before you leave today.   Dorris Singh, MD  Family Medicine

## 2021-03-29 NOTE — Progress Notes (Signed)
SUBJECTIVE:   CHIEF COMPLAINT: congestion and coughing  HPI:   Tiffany Becker is a very pleasant 76 y.o.  with history notable for type 2 diabetes (very well controlled on oral agents) presenting for congestion, cough, and hoarseness.  Patient recently had cataract surgery on 12 January.  On Friday, 13 January,( 4 days prior to the presentation) the patient developed congestion, cough and had 1 day of fevers.  She reports her fevers have subsided.  Her voice is hoarse.  She has sinus pressure right greater than left and some congestion.  Her ophthalmologist told her she had some fluid behind her ear.  She has tried multiple over-the-counter medications including honey, Robitussin and a number of other agents to help with her symptoms.  She has had little relief.  She wonders if she needs a Z-Pak to clear up her symptoms or something is going on.  Of note the patient has had an ongoing cough.  She was diagnosed with suspected influenza in November.  She reports her cough did subside generally but she notices it intermittently.  She does report that the cough is usually nonproductive.  This morning she noticed her sputum was yellow in color.  She specifically denies chest pain or any dyspnea.  She is been able to do her usual activities.  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  The patient's A1c today is 7.2.  She would like to see this be lower.  She denies symptoms of hyper or hypoglycemia.  She wonders if she should go up on her glyburide.  The patient reports tolerance to her rosuvastatin. The patient was recently treated for urinary tract infection over the phone with cephalexin.  She reports most of her symptoms have resolved.  She does report intermittent urinary tract infections.  She largely suspect this is due to incomplete defecation.  She reports she incompletely evacuates and then sometimes has fecal material near the urethra.  She reports most of her symptoms of a urinary  tract infection are gone but she wonders if she could still have residual.  PERTINENT  PMH / PSH/Family/Social History : noted   OBJECTIVE:   BP 130/68    Pulse 68    Ht 5\' 9"  (1.753 m)    Wt 196 lb (88.9 kg)    SpO2 98%    BMI 28.94 kg/m   Today's weight:  Last Weight  Most recent update: 03/29/2021 10:38 AM    Weight  88.9 kg (196 lb)            Review of prior weights: Filed Weights   03/29/21 1038  Weight: 196 lb (88.9 kg)   Bilateral external auditory canals are very clean, tympanic membranes are clear without erythema there is no bulging.  There is a small amount of fluid particular behind the right tympanic membrane.  Nares have some mild congestion.  Oropharynx is minimally erythematous with significant evidence of postnasal drip.  There is no lymphadenopathy.  Lungs clear bilaterally to auscultation.  Cardiac exam is notable for regular rate and rhythm without murmurs rubs or gallops. Sinus tenderness right greater than left No lower extremity edema Talkative and appropriate throughout the exam she does sound hoarse  ASSESSMENT/PLAN:    type 2 diabetes mellitus ([place]) complicated by dyslipidemia  A1c is at goal.  We discussed the risk of hypoglycemia, particularly sulfonylurea use.  We will continue current medication therapy.  Recommend repeating in 3 months.  If this is still  elevated consider transition to alternative agent such as GLP-1 agonist which be weekly.  Patient is on a statin as the patient's type 2 diabetes is complicated by high cholesterol.  She reports compliance.  Lipid panel today  BMP today for monitoring.   Transition metformin to ER version given her side effects of diarrhea with this short acting medication.  We will see if this improves her symptoms.  Viral respiratory infection low suspicion for allergies given symptoms.  Symptoms are most consistent with a rhino, adeno or respiratory syncytial type virus.  Given duration and absence of ongoing  fevers or other symptoms of bacterial infection low suspicion for bacterial infection at this time.  We discussed the signs and symptoms of bacterial coinfection.  Flu and COVID testing today.  She is within the window for treatment for COVID.  COVID or flu diagnosis  would potentially change her ability to have her cataract surgery on the 26th.  Recommended continued symptomatic treatment, pushing fluids recommended honey for cough.  We will let her know the results of the test and next steps.  She is to call if her symptoms improve then worsen as this could indicate a bacterial infection.  Given the duration of her cough I recommended a chest x-ray.  Particular if the cough persists beyond a few weeks.  She did have a persistent cough for more than 8 weeks in the fall it seems.  Could also consider lisinopril  as cause and transition to losartan if the cough persists.  History of frequent UTI offered pelvic floor therapy, could also consider referral to urogynecology in the future.  Repeat urine culture today.  She was last treated in mid December.    Dorris Singh, [place]

## 2021-03-29 NOTE — Assessment & Plan Note (Signed)
A1c is at goal.  We discussed the risk of hypoglycemia, particularly sulfonylurea use.  We will continue current medication therapy.  Recommend repeating in 3 months.  If this is still elevated consider transition to alternative agent such as GLP-1 agonist which be weekly.  Patient is on a statin as the patient's type 2 diabetes is complicated by high cholesterol.  She reports compliance.  Lipid panel today  BMP today for monitoring.

## 2021-03-30 LAB — BASIC METABOLIC PANEL
BUN/Creatinine Ratio: 13 (ref 12–28)
BUN: 12 mg/dL (ref 8–27)
CO2: 21 mmol/L (ref 20–29)
Calcium: 9.5 mg/dL (ref 8.7–10.3)
Chloride: 96 mmol/L (ref 96–106)
Creatinine, Ser: 0.9 mg/dL (ref 0.57–1.00)
Glucose: 158 mg/dL — ABNORMAL HIGH (ref 70–99)
Potassium: 4.8 mmol/L (ref 3.5–5.2)
Sodium: 134 mmol/L (ref 134–144)
eGFR: 67 mL/min/{1.73_m2} (ref >59)

## 2021-03-30 LAB — LDL CHOLESTEROL, DIRECT: LDL Direct: 47 mg/dL (ref 0–99)

## 2021-03-31 ENCOUNTER — Telehealth: Payer: Self-pay | Admitting: Family Medicine

## 2021-03-31 DIAGNOSIS — U071 COVID-19: Secondary | ICD-10-CM

## 2021-03-31 LAB — COVID-19, FLU A+B NAA
Influenza A, NAA: NOT DETECTED
Influenza B, NAA: NOT DETECTED
SARS-CoV-2, NAA: DETECTED — AB

## 2021-03-31 MED ORDER — NIRMATRELVIR/RITONAVIR (PAXLOVID)TABLET: 3.0000 | ORAL_TABLET | Freq: Two times a day (BID) | ORAL | 0 refills | Status: AC

## 2021-03-31 MED ORDER — NIRMATRELVIR/RITONAVIR (PAXLOVID)TABLET: 3.0000 | ORAL_TABLET | Freq: Two times a day (BID) | ORAL | 0 refills | Status: DC

## 2021-03-31 NOTE — Telephone Encounter (Signed)
Called patient to discuss results.  She is COVID-positive.  Urine culture growing gram-negative rods.  She continues to feel weak and have a poor appetite.  She denies chest pain or dyspnea.  Prescribed Paxlovid but after lengthy discussion of potential risks, benefits and side effects.  Drug interactions were checked and reviewed extensively.  Rosuvastatin, amlodipine and glyburide held for duration of treatment and for 3 days thereafter.  She can resume these medications thereafter.  If her blood sugar rises she can restart glyburide.  We will also send MyChart message with this information and also put on prescription per patient's request.  Per patient's request sent [facility].  Of note husband is also sick we will schedule for virtual visit and instructed wife to do test.

## 2021-03-31 NOTE — Telephone Encounter (Signed)
When scheduling virtual visit for spouse, patient reports that [facility] does not carry Paxlovid and needs rx sent to [facility] in [place].   [place] and canceled rx.   Resent prescription to [facility] in [place].   Talbot Grumbling, RN

## 2021-03-31 NOTE — Addendum Note (Signed)
Addended by: Talbot Grumbling on: 03/31/2021 09:37 AM   Modules accepted: Orders

## 2021-04-02 ENCOUNTER — Telehealth: Payer: Self-pay | Admitting: Family Medicine

## 2021-04-02 DIAGNOSIS — N390 Urinary tract infection, site not specified: Secondary | ICD-10-CM

## 2021-04-02 DIAGNOSIS — N3090 Cystitis, unspecified without hematuria: Secondary | ICD-10-CM

## 2021-04-02 LAB — URINE CULTURE

## 2021-04-02 MED ORDER — CEFDINIR 300 MG PO CAPS: 300.0000 mg | ORAL_CAPSULE | Freq: Two times a day (BID) | ORAL | 0 refills | Status: DC

## 2021-04-02 NOTE — Telephone Encounter (Signed)
Called patient with results. Growing Klebsiella and E. Coli. Resistant to cefuroxime. Sensitive to ceftriaxone. Sent in cefdinir, if symptoms not improving switch to doxycycline. All questions answered. Given frequency of UTI, referral to Urology sent.  Dorris Singh, MD  Family Medicine Teaching Service

## 2021-04-19 ENCOUNTER — Other Ambulatory Visit: Payer: Self-pay | Admitting: *Deleted

## 2021-04-19 DIAGNOSIS — R300 Dysuria: Secondary | ICD-10-CM | POA: Diagnosis not present

## 2021-04-19 DIAGNOSIS — N390 Urinary tract infection, site not specified: Secondary | ICD-10-CM | POA: Diagnosis not present

## 2021-04-19 DIAGNOSIS — N12 Tubulo-interstitial nephritis, not specified as acute or chronic: Secondary | ICD-10-CM | POA: Diagnosis not present

## 2021-04-19 DIAGNOSIS — E119 Type 2 diabetes mellitus without complications: Secondary | ICD-10-CM

## 2021-04-19 MED ORDER — METFORMIN HCL ER (MOD) 1000 MG PO TB24: 1000.0000 mg | ORAL_TABLET | Freq: Every day | ORAL | 3 refills | Status: DC

## 2021-04-22 DIAGNOSIS — Z87440 Personal history of urinary (tract) infections: Secondary | ICD-10-CM | POA: Diagnosis not present

## 2021-04-22 DIAGNOSIS — N390 Urinary tract infection, site not specified: Secondary | ICD-10-CM | POA: Diagnosis not present

## 2021-04-22 DIAGNOSIS — K7589 Other specified inflammatory liver diseases: Secondary | ICD-10-CM | POA: Diagnosis not present

## 2021-04-23 ENCOUNTER — Other Ambulatory Visit: Payer: Self-pay | Admitting: Family Medicine

## 2021-04-23 ENCOUNTER — Telehealth: Payer: Self-pay

## 2021-04-23 NOTE — Telephone Encounter (Signed)
Patient calls nurse line requesting antibiotics for possible sinus infection. Patient reports ear pain, sore throat, headache, sinus pressure, yellow nasal drainage. Reports slight chills last night.   She has been using vaporizer, vicks vapor rub, mucinex sinus, however, is not improving.   We do not have any appointments until Monday.   Please advise.   Talbot Grumbling, RN

## 2021-04-26 NOTE — Telephone Encounter (Signed)
Called patient. Patient was not at home, husband answered. Provided husband with message that patient would need to call our office to schedule appointment.   Patient will return call to office in regards to scheduling appointment.   Talbot Grumbling, RN

## 2021-04-27 ENCOUNTER — Ambulatory Visit (INDEPENDENT_AMBULATORY_CARE_PROVIDER_SITE_OTHER): Payer: Medicare Other | Admitting: Student

## 2021-04-27 ENCOUNTER — Other Ambulatory Visit: Payer: Self-pay

## 2021-04-27 DIAGNOSIS — B9689 Other specified bacterial agents as the cause of diseases classified elsewhere: Secondary | ICD-10-CM

## 2021-04-27 DIAGNOSIS — J329 Chronic sinusitis, unspecified: Secondary | ICD-10-CM

## 2021-04-27 MED ORDER — AMOXICILLIN-POT CLAVULANATE 875-125 MG PO TABS: 1.0000 | ORAL_TABLET | Freq: Two times a day (BID) | ORAL | 0 refills | Status: AC

## 2021-04-27 NOTE — Assessment & Plan Note (Signed)
Likely acute bacterial rhinosinusitis (ABRS) given persistence of symptoms >10 dayswith sinus pressure, drainage and chills. Given age >79 and type 2 DM, will treat with Augmentin 875-125 BID for 5 days. No concern for complicated ABRS given lack of periorbital edema, vision changes, cranial nerve palsies, AMS, neck stiffness. Provided strict return precautions should she develop any of these symptoms - If symptoms do not improve following completion of antibacterial treatment, I told her to call our office and return for evaluation. Also encouraged her to call her eye surgeon to inform them of her symptoms and our treatment plan

## 2021-04-27 NOTE — Patient Instructions (Addendum)
It was great to see you! Thank you for allowing me to participate in your care!  I recommend that you always bring your medications to each appointment as this makes it easy to ensure we are on the correct medications and helps [place] not miss when refills are needed.  Our plans for today:  - I think your sinus pain and pressure/drainage are from bacterial sinusitis because of persistent symptoms. I sent in an antibiotic Augmentin to be taken twice daily for 5 days. After completion of this antibiotic, if your symptoms persist please call and schedule an appointment in our clinic. - Call your eye surgeon and let them know what is going on to keep them in the loop. - If you develop any pain with eye movement, swelling or visual disturbances, please seek urgent care at the ER.  Take care and seek immediate care sooner if you develop any concerns.   Dr. Orvis Brill, [place]

## 2021-04-27 NOTE — Progress Notes (Signed)
° ° °  SUBJECTIVE:   CHIEF COMPLAINT / HPI:   Tiffany Becker is a pleasant 76 year old female presenting for sinus pain, pressure and yellow discharge x 1 week now green with some blood. Has Neti pot at home and feeling so congested she couldn't even use it.  Had right cataract surgery on 1/12. Then, had COVID a few days later. Reports eye pain, pressure and headache that started this morning. No blurry vision or double vision. She says when she got up "my head felt like it weighed 100 pound." No fever. Does have some chills. Still coughing, no sputum production. Has been taking Mucinex DM at home, with no relief. Also using Vick's vapor rub without relief.  PERTINENT  PMH / PSH: T2DM, Cataracts, Recurrent UTI  OBJECTIVE:   BP 134/60    Pulse 82    Ht 5\' 9"  (1.753 m)    Wt 196 lb 3.2 oz (89 kg)    SpO2 97%    BMI 28.97 kg/m   General: Well-appearing, in no distress, very pleasant HEENT: Pupils PERRLA. EOMI. No conjunctival redness/injection or periorbital edema or erythema. Mild tenderness to palpation of maxillary sinuses bilaterally. No nasal septum deviation or nasal polyps. Mildly inflamed turbinates. No oropharyngeal erythema or tonsillar hypertrophy or exudate. CV: RRR Resp: Breathing comfortably on room air. Lungs clear in all fields. Neuro: No focal neurologic deficits. Skin: Warm, dry  ASSESSMENT/PLAN:   Bacterial sinusitis Likely acute bacterial rhinosinusitis (ABRS) given persistence of symptoms >10 dayswith sinus pressure, drainage and chills. Given age >52 and type 2 DM, will treat with Augmentin 875-125 BID for 5 days. No concern for complicated ABRS given lack of periorbital edema, vision changes, cranial nerve palsies, AMS, neck stiffness. Provided strict return precautions should she develop any of these symptoms - If symptoms do not improve following completion of antibacterial treatment, I told her to call our office and return for evaluation. Also encouraged her to call  her eye surgeon to inform them of her symptoms and our treatment plan     Orvis Brill, [place], [place]

## 2021-05-04 DIAGNOSIS — Z79899 Other long term (current) drug therapy: Secondary | ICD-10-CM | POA: Diagnosis not present

## 2021-05-05 LAB — LIPID PANEL
Chol/HDL Ratio: 2.2 ratio (ref 0.0–4.4)
Cholesterol, Total: 148 mg/dL (ref 100–199)
HDL: 68 mg/dL (ref >39)
LDL Chol Calc (NIH): 66 mg/dL (ref 0–99)
Triglycerides: 71 mg/dL (ref 0–149)
VLDL Cholesterol Cal: 14 mg/dL (ref 5–40)

## 2021-05-14 ENCOUNTER — Other Ambulatory Visit: Payer: Self-pay

## 2021-05-14 ENCOUNTER — Ambulatory Visit: Payer: Medicare Other | Admitting: Internal Medicine

## 2021-05-14 ENCOUNTER — Encounter: Payer: Self-pay | Admitting: Internal Medicine

## 2021-05-14 VITALS — BP 140/78 | HR 63 | Ht 69.0 in | Wt 196.6 lb

## 2021-05-14 DIAGNOSIS — T466X5D Adverse effect of antihyperlipidemic and antiarteriosclerotic drugs, subsequent encounter: Secondary | ICD-10-CM | POA: Diagnosis not present

## 2021-05-14 DIAGNOSIS — E785 Hyperlipidemia, unspecified: Secondary | ICD-10-CM | POA: Diagnosis not present

## 2021-05-14 DIAGNOSIS — Z8249 Family history of ischemic heart disease and other diseases of the circulatory system: Secondary | ICD-10-CM | POA: Diagnosis not present

## 2021-05-14 DIAGNOSIS — M791 Myalgia, unspecified site: Secondary | ICD-10-CM

## 2021-05-14 DIAGNOSIS — T466X5A Adverse effect of antihyperlipidemic and antiarteriosclerotic drugs, initial encounter: Secondary | ICD-10-CM

## 2021-05-14 DIAGNOSIS — E119 Type 2 diabetes mellitus without complications: Secondary | ICD-10-CM | POA: Diagnosis not present

## 2021-05-14 NOTE — Progress Notes (Signed)
? ? ?LIPID CLINIC CONSULT NOTE ? ?Chief Complaint:  ?Manage dyslipidemia ? ?Primary Care Physician: ?Leeanne Rio, MD ? ?Primary Cardiologist:  ?None ? ?HPI:  ?Tiffany Becker is a 76 y.o. female who is being seen today for the evaluation of dyslipidemia at the request of Ardelia Mems, Delorse Limber, MD.  This is a pleasant 76 year old female kindly referred for evaluation of dyslipidemia.  She has a history of high cholesterol with intolerance to simvastatin and more recently a atorvastatin due to to causing symptoms of leg pain and hair loss.  She was also noted to have recent vitamin D deficiency with a level of about 24 on repletion.  She has a family history of heart disease in her father who had an MI at 46.  Recent lipids in December showed total cholesterol of 197, triglycerides 106, HDL 63 and LDL 115.  She was previously taking atorvastatin 40 mg every Monday Wednesday and Friday and decrease the dose to 20 mg 3 times a week but still had side effects.  She feels that she eats a fairly healthy diet but has been actively trying to reduce saturated fats. ? ?10/28/2020 ? ?Tiffany Becker returns today for follow-up.  She has had significant improvement in her lipids.  Total cholesterol now 136, HDL 58, triglycerides 74 and LDL 63.  This apparently is on predominantly 5 mg of rosuvastatin.  She was also approved for Repatha however was concerned about side effects of the medication.  Although she did get the prescription filled and qualified for a health well grant for which she got $2500 to use for the year, at this point he does not appear that she needs the medication.  She is also made some dietary changes.  She has reached target LDL less than 70. ? ?05/14/2021 ? ?Tiffany Becker is seen today for follow-up.  Overall she seems to be doing well.  She denies any chest pain or worsening shortness of breath.  She is tolerating low-dose rosuvastatin and has some infrequent leg discomfort.  That being said, her lipids  remain low with LDL now at 66, total cholesterol 148, HDL 68 and Trigs 71. ? ?PMHx:  ?Past Medical History:  ?Diagnosis Date  ? Abnormal renal ultrasound 04/25/02  ? Diabetes mellitus without complication ([place])   ? Diverticulosis of colon 08/12/04  ? GERD (gastroesophageal reflux disease)   ? H pylori neg 08/21/02  ? Hypertension   ? Peptic ulcer disease 03/14/80  ? EGD  ? Primary osteoarthritis of both hips 4/09  ? Dr Mayer Camel  ? Pyelonephritis 11/02  ? Vitreous detachment   ? ? ?Past Surgical History:  ?Procedure Laterality Date  ? BREAST REDUCTION SURGERY  03/14/93  ? BUNIONECTOMY  12/09  ? L with 2nd and 5th toe straightening  ? CERVICAL DISCECTOMY  03/14/06  ? C6-7  ? CHOLECYSTECTOMY  01/12/89  ? 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  ? SALPINGOOPHORECTOMY  09/11/97  ? ruptured cyst  ? TONSILLECTOMY  03/14/52  ? TOTAL ABDOMINAL HYSTERECTOMY  01/12/89  ? L salpingoophorectomy for ruptured cyst  ? TUBAL LIGATION  03/14/72  ? UMBILICAL HERNIA REPAIR  03/14/64  ? ? ?FAMHx:  ?Family History  ?Problem Relation Age of Onset  ? Coronary artery disease Father 80  ?     died age 12  ? Heart disease Father   ? Stroke Father   ? Osteoarthritis Mother   ? Asthma Mother   ? Heart disease Mother   ? Diabetes Brother   ?  bladder, pacemaker, Hemochromatosis  ? Cancer Brother 1  ?     Bladder  ? ? ?SOCHx:  ? reports that she has never smoked. She has never used smokeless tobacco. She reports that she does not drink alcohol and does not use drugs. ? ?ALLERGIES:  ?Allergies  ?Allergen Reactions  ? Rosuvastatin Other (See Comments)  ?  myalgias  ? Simvastatin Other (See Comments)  ?  REACTION: hair loss and leg pain  ? ? ?ROS: ?Pertinent items noted in HPI and remainder of comprehensive ROS otherwise negative. ? ?HOME MEDS: ?Current Outpatient Medications on File Prior to Visit  ?Medication Sig Dispense Refill  ? amLODipine (NORVASC) 5 MG tablet TAKE 1 TABLET BY MOUTH AT BEDTIME 90 tablet 0  ? benzonatate (TESSALON) 100 MG capsule Take 1 capsule (100 mg  total) by mouth 2 (two) times daily as needed for cough. 20 capsule 0  ? cefdinir (OMNICEF) 300 MG capsule Take 1 capsule (300 mg total) by mouth 2 (two) times daily. 10 capsule 0  ? celecoxib (CELEBREX) 100 MG capsule TAKE 1 CAPSULE BY MOUTH  DAILY AS NEEDED 90 capsule 3  ? Cholecalciferol 1.25 MG (50000 UT) TABS Take 1 tablet by mouth once a week. For 12 weeks 12 tablet 0  ? glucose blood (ONETOUCH VERIO) test strip Check sugar once per day 100 each 12  ? glyBURIDE (DIABETA) 2.5 MG tablet TAKE 1/2 (ONE-HALF) TABLET BY MOUTH ONCE DAILY WITH BREAKFAST 45 tablet 0  ? lisinopril (ZESTRIL) 40 MG tablet TAKE 1 TABLET BY MOUTH  DAILY 90 tablet 0  ? metFORMIN (GLUMETZA) 1000 MG (MOD) 24 hr tablet Take 1 tablet (1,000 mg total) by mouth daily with breakfast. 180 tablet 3  ? rosuvastatin (CRESTOR) 5 MG tablet Take 1 tablet (5 mg total) by mouth daily. 90 tablet 3  ? ?No current facility-administered medications on file prior to visit.  ? ? ?LABS/IMAGING: ?No results found for this or any previous visit (from the past 48 hour(s)). ?No results found. ? ?LIPID PANEL: ?   ?Component Value Date/Time  ? CHOL 148 05/04/2021 1141  ? TRIG 71 05/04/2021 1141  ? HDL 68 05/04/2021 1141  ? CHOLHDL 2.2 05/04/2021 1141  ? CHOLHDL 3.0 10/20/2014 0941  ? VLDL 25 10/20/2014 0941  ? [place] 66 05/04/2021 1141  ? LDLDIRECT 47 03/29/2021 1145  ? ? ?WEIGHTS: ?Wt Readings from Last 3 Encounters:  ?05/14/21 196 lb 9.6 oz (89.2 kg)  ?04/27/21 196 lb 3.2 oz (89 kg)  ?03/29/21 196 lb (88.9 kg)  ? ? ?VITALS: ?BP 140/78   Pulse 63   Ht 5\' 9"  (1.753 m)   Wt 196 lb 9.6 oz (89.2 kg)   SpO2 96%   BMI 29.03 kg/m?  ? ?EXAM: ?General appearance: alert and no distress ?Neck: no carotid bruit, no JVD and thyroid not enlarged, symmetric, no tenderness/mass/nodules ?Lungs: clear to auscultation bilaterally ?Heart: regular rate and rhythm, S1, S2 normal, no murmur, click, rub or gallop ?Abdomen: soft, non-tender; bowel sounds normal; no masses,  no  organomegaly ?Extremities: extremities normal, atraumatic, no cyanosis or edema ?Pulses: 2+ and symmetric ?Skin: Skin color, texture, turgor normal. No rashes or lesions ?Neurologic: Grossly normal ?Psych: Pleasant ? ?EKG: ?Deferred ? ?ASSESSMENT: ?Mixed dyslipidemia ?Elevated CAC score of 724, 93rd percentile, aortic atherosclerosis ?DM2 ?Hypertension ?Family history of premature CAD in father ?Statin intolerance -myalgias ? ?PLAN: ?1.   Tiffany Becker continues to have good control over her lipids on low-dose rosuvastatin.  She is tolerating it  well enough.  She does have a high calcium score which was 93rd percentile including aortic atherosclerosis.  Strong family history of heart disease.  We will continue this current therapy but need to see her annually at least for risk modification.  Encouraged her to contact me if she develops any anginal symptoms or worsening dyspnea. ? ?Pixie Casino, MD, [facility], FACP  ?[place]  ?Medical Director of the Advanced Lipid Disorders &  ?Cardiovascular Risk Reduction Clinic ?Diplomate of the [employer] of Clinical Lipidology ?Attending Cardiologist  ?Direct Dial: [phone]  Fax: [phone]  ?Website:  [email or web address] ? ?Nadean Corwin Emary Zalar ?05/14/2021, 1:54 PM  ?

## 2021-05-14 NOTE — Patient Instructions (Signed)
Medication Instructions:  ?Your physician recommends that you continue on your current medications as directed. Please refer to the Current Medication list given to you today. ? ?*If you need a refill on your cardiac medications before your next appointment, please call your pharmacy* ? ? ?Lab Work: ?FASTING lab work in 1 year -- complete before your next visit with Dr. Debara Pickett  ? ?If you have labs (blood work) drawn today and your tests are completely normal, you will receive your results only by: ?MyChart Message (if you have MyChart) OR ?A paper copy in the mail ?If you have any lab test that is abnormal or we need to change your treatment, we will call you to review the results. ? ?Follow-Up: ?At [facility], you and your health needs are our priority.  As part of our continuing mission to provide you with exceptional heart care, we have created designated Provider Care Teams.  These Care Teams include your primary Cardiologist (physician) and Advanced Practice Providers (APPs -  Physician Assistants and Nurse Practitioners) who all work together to provide you with the care you need, when you need it. ? ?We recommend signing up for the patient portal called "MyChart".  Sign up information is provided on this After Visit Summary.  MyChart is used to connect with patients for Virtual Visits (Telemedicine).  Patients are able to view lab/test results, encounter notes, upcoming appointments, etc.  Non-urgent messages can be sent to your provider as well.   ?To learn more about what you can do with MyChart, go to [email or web address].   ? ?Your next appointment:   ?1 year with Dr. Debara Pickett  ? ?

## 2021-05-21 DIAGNOSIS — N390 Urinary tract infection, site not specified: Secondary | ICD-10-CM | POA: Diagnosis not present

## 2021-05-31 ENCOUNTER — Other Ambulatory Visit: Payer: Self-pay | Admitting: Family Medicine

## 2021-06-02 DIAGNOSIS — M19011 Primary osteoarthritis, right shoulder: Secondary | ICD-10-CM | POA: Diagnosis not present

## 2021-06-15 ENCOUNTER — Other Ambulatory Visit: Payer: Self-pay | Admitting: Family Medicine

## 2021-06-30 DIAGNOSIS — M1712 Unilateral primary osteoarthritis, left knee: Secondary | ICD-10-CM | POA: Diagnosis not present

## 2021-07-06 ENCOUNTER — Other Ambulatory Visit: Payer: Self-pay

## 2021-07-06 DIAGNOSIS — E119 Type 2 diabetes mellitus without complications: Secondary | ICD-10-CM

## 2021-07-06 MED ORDER — METFORMIN HCL ER (MOD) 1000 MG PO TB24: 1000.0000 mg | ORAL_TABLET | Freq: Every day | ORAL | 1 refills | Status: DC

## 2021-07-06 NOTE — Telephone Encounter (Signed)
Please let patient know I am refilling this medication, but she needs to schedule an appointment with me.   Thanks, Abdirizak Richison J Layana Konkel, MD  

## 2021-07-22 DIAGNOSIS — H25812 Combined forms of age-related cataract, left eye: Secondary | ICD-10-CM | POA: Diagnosis not present

## 2021-07-22 DIAGNOSIS — I10 Essential (primary) hypertension: Secondary | ICD-10-CM | POA: Diagnosis not present

## 2021-07-22 DIAGNOSIS — Z961 Presence of intraocular lens: Secondary | ICD-10-CM | POA: Diagnosis not present

## 2021-07-22 DIAGNOSIS — Z9841 Cataract extraction status, right eye: Secondary | ICD-10-CM | POA: Diagnosis not present

## 2021-07-27 ENCOUNTER — Ambulatory Visit (INDEPENDENT_AMBULATORY_CARE_PROVIDER_SITE_OTHER): Payer: Medicare Other | Admitting: Family Medicine

## 2021-07-27 ENCOUNTER — Other Ambulatory Visit: Payer: Self-pay | Admitting: Family Medicine

## 2021-07-27 VITALS — BP 134/56 | HR 70 | Wt 195.0 lb

## 2021-07-27 DIAGNOSIS — E785 Hyperlipidemia, unspecified: Secondary | ICD-10-CM

## 2021-07-27 DIAGNOSIS — E1169 Type 2 diabetes mellitus with other specified complication: Secondary | ICD-10-CM

## 2021-07-27 DIAGNOSIS — I10 Essential (primary) hypertension: Secondary | ICD-10-CM | POA: Diagnosis not present

## 2021-07-27 DIAGNOSIS — E119 Type 2 diabetes mellitus without complications: Secondary | ICD-10-CM

## 2021-07-27 LAB — POCT GLYCOSYLATED HEMOGLOBIN (HGB A1C): HbA1c, POC (controlled diabetic range): 7.2 % — AB (ref 0.0–7.0)

## 2021-07-27 MED ORDER — METFORMIN HCL 1000 MG PO TABS: 1000.0000 mg | ORAL_TABLET | Freq: Two times a day (BID) | ORAL | 3 refills | Status: DC

## 2021-07-27 MED ORDER — GLYBURIDE 2.5 MG PO TABS: ORAL_TABLET | ORAL | 3 refills | Status: DC

## 2021-07-27 MED ORDER — AMLODIPINE BESYLATE 5 MG PO TABS: 5.0000 mg | ORAL_TABLET | Freq: Every day | ORAL | 3 refills | Status: DC

## 2021-07-27 NOTE — Patient Instructions (Signed)
It was great to see you again today! ? ?Stay on current medications ? ?Sent in refills ? ?Follow up in 6 months, sooner if needed ? ?Be well, ?Dr. Ardelia Mems ?

## 2021-07-27 NOTE — Progress Notes (Signed)
  Date of Visit: 07/27/2021   SUBJECTIVE:   HPI:  Tiffany Becker presents today for routine follow up.  Diabetes - taking metformin '1000mg'$  twice daily and glyburide 1.'25mg'$  daily (takes 1/2 of a 2.'5mg'$  tablet). Overall doing well with this regimen. No low sugars.  Hypertension - taking lisinopril '40mg'$  daily and amlodipine '5mg'$  daily. Tolerating well.  Pain - takes celebrex '100mg'$  daily as needed, takes only very occasionally, not often.  Hyperlipidemia - taking crestor '5mg'$  daily and tolerating ok - does have some muscle cramps with it sometimes.  OBJECTIVE:   BP (!) 134/56   Pulse 70   Wt 195 lb (88.5 kg)   BMI 28.80 kg/m  Gen: no acute distress, pleasant cooperative HEENT: normocephalic, atraumatic  Lungs: normal work of breathing  Neuro: alert, speech normal, grossly nonfocal  ASSESSMENT/PLAN:   Health maintenance:  - declines COVID booster today - declines shingrix vaccine  Diabetes Well controlled with A1c of 7.2. continue present medications.  Hypertension - Well controlled. Continue current medication regimen.   Hyperlipidemia - tolerating crestor ok - advised can skip days if needed to control leg cramping; some statin intake is better than no statin intake.  FOLLOW UP: Follow up in 6 mos for next A1c  [place] J. Ardelia Mems, [place]

## 2021-07-28 DIAGNOSIS — M1712 Unilateral primary osteoarthritis, left knee: Secondary | ICD-10-CM | POA: Diagnosis not present

## 2021-07-30 ENCOUNTER — Telehealth: Payer: Self-pay | Admitting: Family Medicine

## 2021-07-30 NOTE — Telephone Encounter (Signed)
Rx sent Classie Weng J Jissel Slavens, MD  

## 2021-07-30 NOTE — Telephone Encounter (Signed)
Patient calls nurse line reporting she is completely out of Amlodipine.  Patient is aware a refill was already sent to her mail order pharmacy, however shipment will not be here until the latter part of next week.   Please send in #10 tabs to local [facility].

## 2021-08-04 DIAGNOSIS — M1712 Unilateral primary osteoarthritis, left knee: Secondary | ICD-10-CM | POA: Diagnosis not present

## 2021-08-04 DIAGNOSIS — M1711 Unilateral primary osteoarthritis, right knee: Secondary | ICD-10-CM | POA: Diagnosis not present

## 2021-08-11 DIAGNOSIS — M1712 Unilateral primary osteoarthritis, left knee: Secondary | ICD-10-CM | POA: Diagnosis not present

## 2021-08-16 ENCOUNTER — Telehealth: Payer: Self-pay

## 2021-08-16 ENCOUNTER — Other Ambulatory Visit (HOSPITAL_COMMUNITY): Payer: Self-pay

## 2021-08-16 NOTE — Telephone Encounter (Signed)
A Prior Authorization was initiated for this patients GLYBURIDE through CoverMyMeds.   Key: [id number]

## 2021-08-17 ENCOUNTER — Encounter: Payer: Self-pay | Admitting: *Deleted

## 2021-08-17 NOTE — Telephone Encounter (Addendum)
Prior Auth for patients medication GLYBURIDE denied by [facility] via CoverMyMeds.   Reason: need to try covered drugs: glipzide, glipizide ER/XL, OR provider needs to give specific medical reason why the covered drugs are not appropriate.     CoverMyMeds Key: [id number] Denial letter scanned to chart

## 2021-08-27 MED ORDER — GLYBURIDE 2.5 MG PO TABS: ORAL_TABLET | ORAL | 3 refills | Status: DC

## 2021-08-27 NOTE — Telephone Encounter (Signed)
Called patient to discuss, she prefers to stay on glyburide. Wants it sent to [facility] on [address] and she will just pay out of pocket for it.  Rx sent Leeanne Rio, MD

## 2021-08-27 NOTE — Addendum Note (Signed)
Addended by: Leeanne Rio on: 08/27/2021 10:34 AM   Modules accepted: Orders

## 2021-09-08 DIAGNOSIS — M1711 Unilateral primary osteoarthritis, right knee: Secondary | ICD-10-CM | POA: Diagnosis not present

## 2021-09-15 DIAGNOSIS — M1711 Unilateral primary osteoarthritis, right knee: Secondary | ICD-10-CM | POA: Diagnosis not present

## 2021-09-22 DIAGNOSIS — M1711 Unilateral primary osteoarthritis, right knee: Secondary | ICD-10-CM | POA: Diagnosis not present

## 2021-09-29 LAB — HM DIABETES EYE EXAM

## 2021-11-01 DIAGNOSIS — M170 Bilateral primary osteoarthritis of knee: Secondary | ICD-10-CM | POA: Diagnosis not present

## 2021-11-02 ENCOUNTER — Other Ambulatory Visit: Payer: Self-pay | Admitting: Internal Medicine

## 2021-11-02 DIAGNOSIS — E785 Hyperlipidemia, unspecified: Secondary | ICD-10-CM

## 2021-11-24 ENCOUNTER — Encounter: Payer: Self-pay | Admitting: Family Medicine

## 2021-12-03 DIAGNOSIS — L820 Inflamed seborrheic keratosis: Secondary | ICD-10-CM | POA: Diagnosis not present

## 2021-12-03 DIAGNOSIS — L570 Actinic keratosis: Secondary | ICD-10-CM | POA: Diagnosis not present

## 2021-12-03 DIAGNOSIS — X32XXXD Exposure to sunlight, subsequent encounter: Secondary | ICD-10-CM | POA: Diagnosis not present

## 2021-12-24 ENCOUNTER — Ambulatory Visit: Payer: Medicare Other

## 2021-12-28 ENCOUNTER — Ambulatory Visit: Payer: Medicare Other | Admitting: Family Medicine

## 2022-01-11 DIAGNOSIS — Z1231 Encounter for screening mammogram for malignant neoplasm of breast: Secondary | ICD-10-CM | POA: Diagnosis not present

## 2022-01-18 DIAGNOSIS — Z1211 Encounter for screening for malignant neoplasm of colon: Secondary | ICD-10-CM | POA: Diagnosis not present

## 2022-01-24 ENCOUNTER — Ambulatory Visit (INDEPENDENT_AMBULATORY_CARE_PROVIDER_SITE_OTHER): Payer: Medicare Other | Admitting: Family Medicine

## 2022-01-24 ENCOUNTER — Encounter: Payer: Self-pay | Admitting: Family Medicine

## 2022-01-24 VITALS — BP 125/70 | HR 82 | Ht 69.0 in | Wt 197.4 lb

## 2022-01-24 DIAGNOSIS — I10 Essential (primary) hypertension: Secondary | ICD-10-CM | POA: Diagnosis not present

## 2022-01-24 DIAGNOSIS — E1169 Type 2 diabetes mellitus with other specified complication: Secondary | ICD-10-CM | POA: Diagnosis not present

## 2022-01-24 DIAGNOSIS — E785 Hyperlipidemia, unspecified: Secondary | ICD-10-CM

## 2022-01-24 LAB — POCT GLYCOSYLATED HEMOGLOBIN (HGB A1C): HbA1c, POC (controlled diabetic range): 6.9 % (ref 0.0–7.0)

## 2022-01-24 NOTE — Patient Instructions (Signed)
It was great to see you again today!  A1c looks great Checking urine protein level today Scheduled Annual Wellness Visit with Page Nicki Reaper  Follow up in February - will check labs at that visit  Be well, Dr. Ardelia Mems

## 2022-01-24 NOTE — Progress Notes (Unsigned)
  Date of Visit: 01/24/2022   SUBJECTIVE:   HPI:  Tiffany Becker presents today for routine follow-up.  Diabetes: Currently taking glyburide 1.25 mg daily.  Denies any symptoms of low sugars, or documented low sugars.  This regimen is working well for her.  Today her A1c is 6.9.  Hypertension: Currently taking amlodipine 5 mg daily as well as lisinopril 40 mg daily.  Tolerating these well.  Hyperlipidemia: Takes Crestor 5 mg about 3 days a week.  She gets significant cramping in her feet and legs with this, and cannot tolerate taking it more often than this.   OBJECTIVE:   BP 125/70   Pulse 82   Ht '5\' 9"'$  (1.753 m)   Wt 197 lb 6.4 oz (89.5 kg)   SpO2 97%   BMI 29.15 kg/m  Gen: no acute distress pleasant cooperative HEENT: normocephalic, atraumatic  Heart: regular rate and rhythm, no murmur Lungs: clear to auscultation bilaterally, normal work of breathing  Neuro: alert, speech normal, grossly nonfocal Ext: No appreciable lower extremity edema bilaterally   ASSESSMENT/PLAN:   Health maintenance:  -Declines COVID booster and Shingrix -Scheduled annual wellness visit -Urine microalbumin to creatinine ratio obtained today  Dyslipidemia Continue statin as much as she is able to tolerate.  Essential hypertension, benign Well controlled. Continue current medication regimen.    type 2 diabetes mellitus ([place]) complicated by dyslipidemia  Well controlled. Continue current medication regimen.   FOLLOW UP: Follow up in 3 mos for above issues, plan fasting labs at that visit  [place] J. Ardelia Mems, [place]

## 2022-01-25 LAB — MICROALBUMIN / CREATININE URINE RATIO
Creatinine, Urine: 103.4 mg/dL
Microalb/Creat Ratio: 7 mg/g creat (ref 0–29)
Microalbumin, Urine: 7.2 ug/mL

## 2022-01-25 NOTE — Assessment & Plan Note (Signed)
Well controlled. Continue current medication regimen.  

## 2022-01-25 NOTE — Assessment & Plan Note (Signed)
Continue statin as much as she is able to tolerate.

## 2022-01-29 LAB — COLOGUARD
COLOGUARD: NEGATIVE
Cologuard: NEGATIVE

## 2022-02-02 DIAGNOSIS — H40003 Preglaucoma, unspecified, bilateral: Secondary | ICD-10-CM | POA: Diagnosis not present

## 2022-02-09 ENCOUNTER — Ambulatory Visit (INDEPENDENT_AMBULATORY_CARE_PROVIDER_SITE_OTHER): Payer: Medicare Other

## 2022-02-09 VITALS — Ht 69.0 in | Wt 195.0 lb

## 2022-02-09 DIAGNOSIS — Z0000 Encounter for general adult medical examination without abnormal findings: Secondary | ICD-10-CM | POA: Diagnosis not present

## 2022-02-09 NOTE — Patient Instructions (Addendum)
Tiffany Becker Thank you for taking time to come for your Medicare Wellness Visit. I appreciate your ongoing commitment to your health goals. Please review the following plan we discussed and let me know if I can assist you in the future.    These are the goals we discussed:   Goals       HEMOGLOBIN A1C < 7      Last A1c 6.9 on 01/24/2022      Patient Stated      Maintain health to be able to give care to husband.      Weight (lb) < 195 lb (88.5 kg) (pt-stated)      7% weight loss        We also discussed recommended health maintenance. As discussed, you are due for: Health Maintenance  Topic Date Due   Zoster Vaccines- Shingrix (1 of 2) Never done   FOOT EXAM  02/03/2021   COVID-19 Vaccine (4 - 2023-24 season) 11/12/2021   Fecal DNA (Cologuard)  01/28/2022   Diabetic kidney evaluation - GFR measurement  03/29/2022   HEMOGLOBIN A1C  07/25/2022   OPHTHALMOLOGY EXAM  09/30/2022   Diabetic kidney evaluation - Urine ACR  01/25/2023   Medicare Annual Wellness (AWV)  02/10/2023   Pneumonia Vaccine 5+ Years old  Completed   INFLUENZA VACCINE  Completed   DEXA SCAN  Completed   Hepatitis C Screening  Completed   HPV VACCINES  Aged Out   COLONOSCOPY (Pts 45-61yr Insurance coverage will need to be confirmed)  Discontinued   A1c due May 2024. Follow-up with PCP as needed before then.  Bring a copy of advance directive for your chart.   Preventive Care 684Years and Older, Female Preventive care refers to lifestyle choices and visits with your health care provider that can promote health and wellness. Preventive care visits are also called wellness exams. What can I expect for my preventive care visit? Counseling Your health care provider may ask you questions about your: Medical history, including: Past medical problems. Family medical history. Pregnancy and menstrual history. History of falls. Current health, including: Memory and ability to understand (cognition). Emotional  well-being. Home life and relationship well-being. Sexual activity and sexual health. Lifestyle, including: Alcohol, nicotine or tobacco, and drug use. Access to firearms. Diet, exercise, and sleep habits. Work and work eStatistician Sunscreen use. Safety issues such as seatbelt and bike helmet use. Physical exam Your health care provider will check your: Height and weight. These may be used to calculate your BMI (body mass index). BMI is a measurement that tells if you are at a healthy weight. Waist circumference. This measures the distance around your waistline. This measurement also tells if you are at a healthy weight and may help predict your risk of certain diseases, such as type 2 diabetes and high blood pressure. Heart rate and blood pressure. Body temperature. Skin for abnormal spots. What immunizations do I need?  Vaccines are usually given at various ages, according to a schedule. Your health care provider will recommend vaccines for you based on your age, medical history, and lifestyle or other factors, such as travel or where you work. What tests do I need? Screening Your health care provider may recommend screening tests for certain conditions. This may include: Lipid and cholesterol levels. Hepatitis C test. Hepatitis B test. HIV (human immunodeficiency virus) test. STI (sexually transmitted infection) testing, if you are at risk. Lung cancer screening. Colorectal cancer screening. Diabetes screening. This is done by checking your blood  sugar (glucose) after you have not eaten for a while (fasting). Mammogram. Talk with your health care provider about how often you should have regular mammograms. BRCA-related cancer screening. This may be done if you have a family history of breast, ovarian, tubal, or peritoneal cancers. Bone density scan. This is done to screen for osteoporosis. Talk with your health care provider about your test results, treatment options, and if  necessary, the need for more tests. Follow these instructions at home: Eating and drinking  Eat a diet that includes fresh fruits and vegetables, whole grains, lean protein, and low-fat dairy products. Limit your intake of foods with high amounts of sugar, saturated fats, and salt. Take vitamin and mineral supplements as recommended by your health care provider. Do not drink alcohol if your health care provider tells you not to drink. If you drink alcohol: Limit how much you have to 0-1 drink a day. Know how much alcohol is in your drink. In the [place] one drink equals one 12 oz bottle of beer (355 mL), one 5 oz glass of wine (148 mL), or one 1 oz glass of hard liquor (44 mL). Lifestyle Brush your teeth every morning and night with fluoride toothpaste. Floss one time each day. Exercise for at least 30 minutes 5 or more days each week. Do not use any products that contain nicotine or tobacco. These products include cigarettes, chewing tobacco, and vaping devices, such as e-cigarettes. If you need help quitting, ask your health care provider. Do not use drugs. If you are sexually active, practice safe sex. Use a condom or other form of protection in order to prevent STIs. Take aspirin only as told by your health care provider. Make sure that you understand how much to take and what form to take. Work with your health care provider to find out whether it is safe and beneficial for you to take aspirin daily. Ask your health care provider if you need to take a cholesterol-lowering medicine (statin). Find healthy ways to manage stress, such as: Meditation, yoga, or listening to music. Journaling. Talking to a trusted person. Spending time with friends and family. Minimize exposure to UV radiation to reduce your risk of skin cancer. Safety Always wear your seat belt while driving or riding in a vehicle. Do not drive: If you have been drinking alcohol. Do not ride with someone who has been  drinking. When you are tired or distracted. While texting. If you have been using any mind-altering substances or drugs. Wear a helmet and other protective equipment during sports activities. If you have firearms in your house, make sure you follow all gun safety procedures. What's next? Visit your health care provider once a year for an annual wellness visit. Ask your health care provider how often you should have your eyes and teeth checked. Stay up to date on all vaccines. This information is not intended to replace advice given to you by your health care provider. Make sure you discuss any questions you have with your health care provider. Document Revised: 08/26/2020 Document Reviewed: 08/26/2020 Elsevier Patient Education  [place].   Diet Recommendations for Diabetes   1. Eat at least 3 meals and 1-2 snacks per day. Never go more than 4-5 hours while awake without eating. Eat breakfast within the first hour of getting up.   2. Limit starchy foods to TWO per meal and ONE per snack. ONE portion of a starchy  food is equal to the following:   -  ONE slice of bread (or its equivalent, such as half of a hamburger bun).   - 1/2 cup of a "scoopable" starchy food such as potatoes or rice.   - 15 grams of Total Carbohydrate as shown on food label.  3. Include at every meal: a protein food, a carb food, and vegetables and/or fruit.   - Obtain twice the volume of vegetables as protein or carbohydrate foods for both lunch and dinner.   - Fresh or frozen vegetables are best.   - Keep frozen vegetables on hand for a quick vegetable serving.       Starchy (carb) foods: Bread, rice, pasta, potatoes, corn, cereal, grits, crackers, bagels, muffins, all baked goods.  (Fruits, milk, and yogurt also have carbohydrate, but most of these foods will not spike your blood sugar as most starchy foods will.)  A few fruits do cause high blood sugars; use small portions of bananas (limit to 1/2 at a  time), grapes, watermelon, oranges, and most tropical fruits.    Protein foods: Meat, fish, poultry, eggs, dairy foods, and beans such as pinto and kidney beans (beans also provide carbohydrate).    Our clinic's number is [phone]. Please call with questions or concerns about what we discussed today.

## 2022-02-09 NOTE — Progress Notes (Signed)
Subjective:   Tiffany Becker is a 76 y.o. female who presents for Medicare Annual (Subsequent) preventive examination.  Patient consented to have virtual visit and was identified by name and date of birth. Method of visit: Telephone  Encounter participants: Patient: Tiffany Becker - located at Home Nurse/Provider: Dorna Bloom - located at [facility] Others (if applicable): NA  Review of Systems: Defer to PCP  Cardiac Risk Factors include: advanced age (>13mn, >>49women);diabetes mellitus;hypertension  Objective:   Vitals: Ht '5\' 9"'$  (1.753 m)   Wt 195 lb (88.5 kg)   BMI 28.80 kg/m   Body mass index is 28.8 kg/m.     02/09/2022    9:56 AM 01/24/2022   11:58 AM 04/27/2021    1:35 PM 03/29/2021   10:40 AM 06/11/2020   11:25 AM 05/14/2019   10:09 AM 04/23/2018    1:53 PM  Advanced Directives  Does Patient Have a Medical Advance Directive? Yes No Yes Yes No No Yes  Type of AParamedicof [place] will  [place] will [place]   [place]  Does patient want to make changes to medical advance directive?    No - Patient declined     Copy of HFlora Vistain Chart? No - copy requested  No - copy requested No - copy requested   No - copy requested  Would patient like information on creating a medical advance directive?     No - Patient declined No - Patient declined    Patient reports her advance directive is located in her safe at home.   Tobacco Social History   Tobacco Use  Smoking Status Never   Passive exposure: Never  Smokeless Tobacco Never     Clinical Intake:  Pre-visit preparation completed: Yes  Diabetes: Yes  Last A1c: 6.9 om 11/13/20223  How often do you need to have someone help you when you read instructions, pamphlets, or other written materials from your doctor or pharmacy?: 1 - Never What is the last grade level you  completed in school?: High School  Past Medical History:  Diagnosis Date   Abnormal renal ultrasound 04/25/2002   Diabetes mellitus without complication ([place]    Diverticulosis of colon 08/12/2004   GERD (gastroesophageal reflux disease)    H pylori neg 08/21/02   Hypertension    Peptic ulcer disease 03/14/1980   EGD   Primary osteoarthritis of both hips 06/13/2007   Dr RMayer Camel  Pyelonephritis 01/12/2001   Vitreous detachment    Past Surgical History:  Procedure Laterality Date   BREAST REDUCTION SURGERY  03/14/93   BUNIONECTOMY  12/09   L with 2nd and 5th toe straightening   CERVICAL DISCECTOMY  03/14/06   C6-7   CHOLECYSTECTOMY  01/12/89   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   SALPINGOOPHORECTOMY  09/11/97   ruptured cyst   TONSILLECTOMY  03/14/52   TOTAL ABDOMINAL HYSTERECTOMY  01/12/89   L salpingoophorectomy for ruptured cyst   TUBAL LIGATION  12/13/07  UMBILICAL HERNIA REPAIR  03/14/64   Family History  Problem Relation Age of Onset   COPD Mother    Osteoarthritis Mother    Asthma Mother    Heart disease Mother    Coronary artery disease Father 257      died age 417  Heart disease Father    Stroke Father    Diabetes Brother  bladder, pacemaker, Hemochromatosis   Cancer Brother 63       Bladder   Social History   Socioeconomic History   Marital status: Married    Spouse name: John   Number of children: 2   Years of education: 12   Highest education level: 12th grade  Occupational History   Occupation: Retired-Sales of store fixtures  Tobacco Use   Smoking status: Never    Passive exposure: Never   Smokeless tobacco: Never  Vaping Use   Vaping Use: Never used  Substance and Sexual Activity   Alcohol use: No   Drug use: No   Sexual activity: Not Currently  Other Topics Concern   Not on file  Social History Narrative   Retired- Financial controller with 2 levels, 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 Smoke detectors, no tripping hazards.Has grab bars in bathroom.   2  daughters in the area; [place]. One grandson locally.    Emergency Contact: husband, Jenny Reichmann [phone]   End of Life Plan: pt has advance directives but does not have in chart, requested copy from family.   Who lives with you: husband    Any pets: dog-Jack   Diet: Pt has a varied diet of protein, starch and vegetables.   Exercise: Patient reports she is very active throughout the day. Walking the dog, running errands and house chores.    Seatbelts: Pt reports wearing seatbelt when in vehicles.    Nancy Fetter Exposure/Protection: Pt reports using sunscreen regularly.    Hobbies: church, walking, yard work, cooking         Social Determinants of Radio broadcast assistant Strain: [place]  (02/09/2022)   Overall Financial Resource Strain (CARDIA)    Difficulty of Paying Living Expenses: Not hard at all  Food Insecurity: No Food Insecurity (02/09/2022)   Hunger Vital Sign    Worried About Running Out of Food in the Last Year: Never true    [place] in the Last Year: Never true  Transportation Needs: No Transportation Needs (02/09/2022)   PRAPARE - Hydrologist (Medical): No    Lack of Transportation (Non-Medical): No  Physical Activity: Insufficiently Active (02/09/2022)   Exercise Vital Sign    Days of Exercise per Week: 3 days    Minutes of Exercise per Session: 30 min  Stress: No Stress Concern Present (02/09/2022)   [place]    Feeling of Stress : Not at all  Social Connections: [place] (02/09/2022)   Social Connection and Isolation Panel [NHANES]    Frequency of Communication with Friends and Family: More than three times a week    Frequency of Social Gatherings with Friends and Family: More than three times a week    Attends Religious Services: More than 4 times per year    Active Member of [employer] or Organizations: Yes    Attends Archivist  Meetings: More than 4 times per year    Marital Status: Married   Outpatient Encounter Medications as of 02/09/2022  Medication Sig   amLODipine (NORVASC) 5 MG tablet TAKE 1 TABLET BY MOUTH AT BEDTIME   celecoxib (CELEBREX) 100 MG capsule TAKE 1 CAPSULE BY MOUTH  DAILY AS NEEDED   glyBURIDE (DIABETA) 2.5 MG tablet TAKE 1/2 (ONE-HALF) TABLET BY MOUTH ONCE DAILY WITH BREAKFAST   lisinopril (ZESTRIL) 40 MG tablet TAKE 1 TABLET BY MOUTH DAILY   metFORMIN (GLUCOPHAGE) 1000 MG  tablet Take 1 tablet (1,000 mg total) by mouth 2 (two) times daily with a meal.   Multiple Vitamins-Minerals (MULTIVITAMIN GUMMIES ADULT PO) Take by mouth.   rosuvastatin (CRESTOR) 5 MG tablet Take 1 tablet by mouth once daily (Patient taking differently: Take 5 mg by mouth every Monday, Wednesday, and Friday.)   VITAMIN D PO Take by mouth.   [DISCONTINUED] metFORMIN (GLUMETZA) 1000 MG (MOD) 24 hr tablet Take 1 tablet (1,000 mg total) by mouth daily with breakfast.   No facility-administered encounter medications on file as of 02/09/2022.   Activities of Daily Living    02/09/2022    9:56 AM  In your present state of health, do you have any difficulty performing the following activities:  Hearing? 0  Vision? 0  Difficulty concentrating or making decisions? 0  Walking or climbing stairs? 0  Dressing or bathing? 0  Doing errands, shopping? 0  Preparing Food and eating ? N  Using the Toilet? N  In the past six months, have you accidently leaked urine? Y  Do you have problems with loss of bowel control? N  Managing your Medications? N  Managing your Finances? N  Housekeeping or managing your Housekeeping? N   Patient Care Team: Leeanne Rio, MD as PCP - General (Family Medicine) Marilynne Halsted, MD (Ophthalmology) Garrel Ridgel, DPM as Consulting Physician (Podiatry) Debara Pickett Nadean Corwin, MD as Consulting Physician (Cardiology)    Assessment:   This is a routine wellness examination for  Ansted.  Exercise Activities and Dietary recommendations Current Exercise Habits: The patient does not participate in regular exercise at present, Exercise limited by: cardiac condition(s);orthopedic condition(s)   Goals       HEMOGLOBIN A1C < 7      Last A1c 6.9 on 01/24/2022      Patient Stated      Maintain health to be able to give care to husband.      Weight (lb) < 195 lb (88.5 kg) (pt-stated)      7% weight loss        Fall Risk    01/24/2022   12:05 PM 03/29/2021   10:39 AM 06/11/2020   11:24 AM 02/04/2020   10:06 AM 05/20/2019    1:34 PM  Fall Risk   Falls in the past year? 0 0 0 0 0  Number falls in past yr:  0 0    Injury with Fall?  0 0    Risk for fall due to :  No Fall Risks      Patient reports zero falls this year. She reports no fall risk. She does not use assistive devices to ambulate.   Is the patient's home free of loose throw rugs in walkways, pet beds, electrical cords, etc?   yes      Grab bars in the bathroom? yes      Handrails on the stairs?   yes      Adequate lighting?   yes  Patient rating of health (0-10) scale: 10   Depression Screen    02/09/2022    9:55 AM 01/24/2022   11:58 AM 07/27/2021   12:14 PM 04/27/2021    1:44 PM  PHQ 2/9 Scores  PHQ - 2 Score 0 0 0 0  PHQ- 9 Score 0 0 0 0    Cognitive Function    04/23/2018    1:55 PM 07/02/2013    1:00 PM  MMSE - Mini Mental State Exam  Orientation to time 5  5  Orientation to Place 5 5  Registration 3 3  Attention/ Calculation 5 5  Recall 3 3  Language- name 2 objects 2 2  Language- repeat 1 1  Language- follow 3 step command 3 3  Language- read & follow direction 1 1  Write a sentence 1 1  Copy design 1 1  Total score 30 30      02/09/2022    9:57 AM 04/23/2018    1:56 PM  6CIT Screen  What Year? 0 points 0 points  What month? 0 points 0 points  What time? 0 points 0 points  Count back from 20 0 points 0 points  Months in reverse 0 points 0 points  Repeat phrase 0  points 0 points  Total Score 0 points 0 points   Immunization History  Administered Date(s) Administered   H1N1 02/25/2008   Influenza Split 12/06/2011   Influenza Whole 12/18/2006, 12/14/2007, 01/06/2009   Influenza,inj,Quad PF,6+ Mos 11/27/2012, 12/09/2013, 11/21/2014, 11/27/2015, 12/23/2016, 12/06/2017, 11/27/2018, 12/03/2019, 11/24/2020   Influenza-Unspecified 12/27/2018, 12/26/2021   PFIZER(Purple Top)SARS-COV-2 Vaccination 04/04/2019, 04/25/2019, 02/13/2020   Pneumococcal Conjugate-13 07/02/2013   Pneumococcal Polysaccharide-23 02/11/2001, 10/28/2014   Td 05/12/2004   Tdap 11/04/2014   Zoster, Live 08/09/2007   Screening Tests Health Maintenance  Topic Date Due   Zoster Vaccines- Shingrix (1 of 2) Never done   FOOT EXAM  02/03/2021   COVID-19 Vaccine (4 - 2023-24 season) 11/12/2021   Fecal DNA (Cologuard)  01/28/2022   Diabetic kidney evaluation - GFR measurement  03/29/2022   HEMOGLOBIN A1C  07/25/2022   OPHTHALMOLOGY EXAM  09/30/2022   Diabetic kidney evaluation - Urine ACR  01/25/2023   Medicare Annual Wellness (AWV)  02/10/2023   Pneumonia Vaccine 19+ Years old  Completed   INFLUENZA VACCINE  Completed   DEXA SCAN  Completed   Hepatitis C Screening  Completed   HPV VACCINES  Aged Out   COLONOSCOPY (Pts 45-24yr Insurance coverage will need to be confirmed)  Discontinued   Cancer Screenings: Lung: Low Dose CT Chest recommended if Age 75-80years, 20 pack-year currently smoking OR have quit w/in 15years. Patient does not qualify. Breast:  Up to date on Mammogram? Yes  12/25/2020 Up to date of Bone Density/Dexa? Yes Colorectal: Cologuard negative 01/2019  Additional Screenings: Hepatitis C Screening: Completed   Plan:  A1c due May 2024. Follow-up with PCP as needed before then.  Bring a copy of advance directive for your chart.   I have personally reviewed and noted the following in the patient's chart:   Medical and social history Use of alcohol, tobacco  or illicit drugs  Current medications and supplements Functional ability and status Nutritional status Physical activity Advanced directives List of other physicians Hospitalizations, surgeries, and ER visits in previous 12 months Vitals Screenings to include cognitive, depression, and falls Referrals and appointments  In addition, I have reviewed and discussed with patient certain preventive protocols, quality metrics, and best practice recommendations. A written personalized care plan for preventive services as well as general preventive health recommendations were provided to patient.  EDorna Bloom [place]umacacori-[place]armen 02/09/2022

## 2022-02-18 ENCOUNTER — Encounter: Payer: Self-pay | Admitting: Family Medicine

## 2022-02-23 DIAGNOSIS — M1711 Unilateral primary osteoarthritis, right knee: Secondary | ICD-10-CM | POA: Diagnosis not present

## 2022-02-23 DIAGNOSIS — M19011 Primary osteoarthritis, right shoulder: Secondary | ICD-10-CM | POA: Diagnosis not present

## 2022-02-25 ENCOUNTER — Encounter: Payer: Self-pay | Admitting: Family Medicine

## 2022-03-03 ENCOUNTER — Telehealth: Payer: Self-pay

## 2022-03-03 MED ORDER — BACLOFEN 10 MG PO TABS: 5.0000 mg | ORAL_TABLET | Freq: Two times a day (BID) | ORAL | 0 refills | Status: DC | PRN

## 2022-03-03 NOTE — Telephone Encounter (Signed)
Called patient and informed.  All questions answered.   Talbot Grumbling, RN

## 2022-03-03 NOTE — Telephone Encounter (Signed)
Sent in rx for baclofen to [facility] on [address] - she should use caution as the baclofen might make her sleepy.  If she is having any redness, warmth, or swelling of her legs should be seen in person for assessment.  Please let patient know.  Thanks, Leeanne Rio, MD

## 2022-03-03 NOTE — Telephone Encounter (Signed)
Patient call nurse line reporting leg cramps.   She reports the last two night she has hardly slept due to consistent cramping. She reports she takes Crestor and reports this is a side effect for her. She reports she takes this medication sparingly because of this.   Patient is requesting a "few" muscle relaxer's to help alleviate the symptoms so she can rest.   Will forward to PCP.

## 2022-03-04 NOTE — Progress Notes (Signed)
I have reviewed this visit and agree with the documentation.   

## 2022-03-11 ENCOUNTER — Other Ambulatory Visit: Payer: Self-pay | Admitting: Family Medicine

## 2022-03-16 ENCOUNTER — Other Ambulatory Visit: Payer: Self-pay | Admitting: Internal Medicine

## 2022-03-16 DIAGNOSIS — E785 Hyperlipidemia, unspecified: Secondary | ICD-10-CM

## 2022-06-06 DIAGNOSIS — H26493 Other secondary cataract, bilateral: Secondary | ICD-10-CM | POA: Diagnosis not present

## 2022-06-08 DIAGNOSIS — M1712 Unilateral primary osteoarthritis, left knee: Secondary | ICD-10-CM | POA: Diagnosis not present

## 2022-06-08 DIAGNOSIS — M1711 Unilateral primary osteoarthritis, right knee: Secondary | ICD-10-CM | POA: Diagnosis not present

## 2022-06-08 DIAGNOSIS — M170 Bilateral primary osteoarthritis of knee: Secondary | ICD-10-CM | POA: Diagnosis not present

## 2022-06-15 ENCOUNTER — Other Ambulatory Visit: Payer: Self-pay | Admitting: Family Medicine

## 2022-06-17 DIAGNOSIS — C4441 Basal cell carcinoma of skin of scalp and neck: Secondary | ICD-10-CM | POA: Diagnosis not present

## 2022-06-17 DIAGNOSIS — L820 Inflamed seborrheic keratosis: Secondary | ICD-10-CM | POA: Diagnosis not present

## 2022-06-17 NOTE — Telephone Encounter (Signed)
Please let patient know I am refilling this medication, but she needs to schedule an appointment with me.   Thanks, Junita Kubota J Shadeed Colberg, MD  

## 2022-06-20 NOTE — Telephone Encounter (Signed)
Called and scheduled patient

## 2022-07-04 ENCOUNTER — Encounter: Payer: Self-pay | Admitting: Cardiovascular Disease

## 2022-07-04 ENCOUNTER — Ambulatory Visit: Payer: Medicare Other | Attending: Cardiovascular Disease | Admitting: Cardiovascular Disease

## 2022-07-04 VITALS — BP 139/77 | HR 74 | Ht 69.0 in | Wt 197.6 lb

## 2022-07-04 DIAGNOSIS — I10 Essential (primary) hypertension: Secondary | ICD-10-CM

## 2022-07-04 DIAGNOSIS — E119 Type 2 diabetes mellitus without complications: Secondary | ICD-10-CM | POA: Diagnosis not present

## 2022-07-04 DIAGNOSIS — R002 Palpitations: Secondary | ICD-10-CM | POA: Diagnosis not present

## 2022-07-04 DIAGNOSIS — E785 Hyperlipidemia, unspecified: Secondary | ICD-10-CM

## 2022-07-04 DIAGNOSIS — I2510 Atherosclerotic heart disease of native coronary artery without angina pectoris: Secondary | ICD-10-CM

## 2022-07-04 NOTE — Progress Notes (Signed)
Cardiology Office Note:    Date:  07/04/2022   ID:  Tiffany Becker, DOB [birth year], MRN [id number]  PCP:  Latrelle Dodrill, MD   [facility] Providers Cardiologist:  None     Referring MD: Latrelle Dodrill, MD   Chief Complaint  Patient presents with   Palpitations    History of Present Illness:    Tiffany Becker is a 77 y.o. female with a hx of elevated coronary calcium score, hypercholesterolemia, hypertension, type 2 diabetes mellitus, osteoarthritis who has been seeing Dr. Rennis Golden for her metabolic abnormalities.  Remains concerned about her heart health after her 9 year old brother recently had bypass surgery and her older brother had a pacemaker implanted.  She has recently had some issues with palpitations that occur when she is emotionally upset.  She is the primary caregiver for her husband Tiffany Becker (who is my patient) who has very limited exercise capacity.  She takes care of all the housework and yard work.  She can do this without chest pain or dyspnea on exertion.  They were in a two-story home and she has to go up and down the stairs several times a day, which she does without any difficulty.  She just carried 15 bags of dirt for her gardening project without any cardiovascular complaints.  The palpitations do not bother her during physical activity.  She denies dizziness or syncope.  She has not had orthopnea, PND, lower extremity edema, claudication or focal neurological complaints.  Neri calcium score performed exactly 2 years ago was elevated at 724, 93rd percentile for age and gender (with the vast majority of the coronary calcium concentrated in the LAD artery).  Tolerate a low-dose of rosuvastatin, taken intermittently (5 mg 3 days a week).  Despite this, her most recent lipid profile shows all parameters in excellent range.  LDL was 66 and HDL was 68.  Her hemoglobin A1c shows good glycemic control (6.9%).  She has normal renal function.  Past  Medical History:  Diagnosis Date   Abnormal renal ultrasound 04/25/2002   Diabetes mellitus without complication    Diverticulosis of colon 08/12/2004   GERD (gastroesophageal reflux disease)    H pylori neg 08/21/02   Hypertension    Peptic ulcer disease 03/14/1980   EGD   Primary osteoarthritis of both hips 06/13/2007   Dr Turner Daniels   Pyelonephritis 01/12/2001   Vitreous detachment     Past Surgical History:  Procedure Laterality Date   BREAST REDUCTION SURGERY  03/14/93   BUNIONECTOMY  12/09   L with 2nd and 5th toe straightening   CERVICAL DISCECTOMY  03/14/06   C6-7   CHOLECYSTECTOMY  01/12/89   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   SALPINGOOPHORECTOMY  09/11/97   ruptured cyst   TONSILLECTOMY  03/14/52   TOTAL ABDOMINAL HYSTERECTOMY  01/12/89   L salpingoophorectomy for ruptured cyst   TUBAL LIGATION  03/14/72   UMBILICAL HERNIA REPAIR  03/14/64    Current Medications: Current Meds  Medication Sig   amLODipine (NORVASC) 5 MG tablet TAKE 1 TABLET BY MOUTH AT  BEDTIME   baclofen (LIORESAL) 10 MG tablet Take 0.5 tablets (5 mg total) by mouth 2 (two) times daily as needed for muscle spasms.   celecoxib (CELEBREX) 100 MG capsule TAKE 1 CAPSULE BY MOUTH  DAILY AS NEEDED   glyBURIDE (DIABETA) 2.5 MG tablet TAKE 1/2 (ONE-HALF) TABLET BY MOUTH ONCE DAILY WITH BREAKFAST   lisinopril (ZESTRIL) 40 MG tablet TAKE 1 TABLET BY MOUTH  DAILY   metFORMIN (GLUCOPHAGE) 1000 MG tablet TAKE 1 TABLET BY MOUTH TWICE  DAILY WITH MEALS   Multiple Vitamins-Minerals (MULTIVITAMIN GUMMIES ADULT PO) Take by mouth.   rosuvastatin (CRESTOR) 5 MG tablet Take 1 tablet by mouth once daily (Patient taking differently: Take 5 mg by mouth every Monday, Wednesday, and Friday.)   VITAMIN D PO Take by mouth.     Allergies:   Simvastatin   Social History   Socioeconomic History   Marital status: Married    Spouse name: John   Number of children: 2   Years of education: 12   Highest education level: 12th grade   Occupational History   Occupation: Retired-Sales of store fixtures  Tobacco Use   Smoking status: Never    Passive exposure: Never   Smokeless tobacco: Never  Vaping Use   Vaping Use: Never used  Substance and Sexual Activity   Alcohol use: No   Drug use: No   Sexual activity: Not Currently  Other Topics Concern   Not on file  Social History Narrative   Retired- Nurse, learning disability with 2 levels, 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 Smoke detectors, no tripping hazards.Has grab bars in bathroom.   2 daughters in the area; [place] and [place]. One grandson locally.    Emergency Contact: husband, Tiffany Becker [phone]   End of Life Plan: pt has advance directives but does not have in chart, requested copy from family.   Who lives with you: husband    Any pets: dog-Jack   Diet: Pt has a varied diet of protein, starch and vegetables.   Exercise: Patient reports she is very active throughout the day. Walking the dog, running errands and house chores.    Seatbelts: Pt reports wearing seatbelt when in vehicles.    Wynelle Link Exposure/Protection: Pt reports using sunscreen regularly.    Hobbies: church, walking, yard work, cooking         Social Determinants of Corporate investment banker Strain: Low Risk  (02/09/2022)   Overall Financial Resource Strain (CARDIA)    Difficulty of Paying Living Expenses: Not hard at all  Food Insecurity: No Food Insecurity (02/09/2022)   Hunger Vital Sign    Worried About Running Out of Food in the Last Year: Never true    Ran Out of Food in the Last Year: Never true  Transportation Needs: No Transportation Needs (02/09/2022)   PRAPARE - Administrator, Civil Service (Medical): No    Lack of Transportation (Non-Medical): No  Physical Activity: Insufficiently Active (02/09/2022)   Exercise Vital Sign    Days of Exercise per Week: 3 days    Minutes of Exercise per Session: 30 min  Stress: No Stress Concern Present (02/09/2022)   [employer] of  Occupational Health - Occupational Stress Questionnaire    Feeling of Stress : Not at all  Social Connections: Socially Integrated (02/09/2022)   Social Connection and Isolation Panel [NHANES]    Frequency of Communication with Friends and Family: More than three times a week    Frequency of Social Gatherings with Friends and Family: More than three times a week    Attends Religious Services: More than 4 times per year    Active Member of [employer] or Organizations: Yes    Attends Engineer, structural: More than 4 times per year    Marital Status: Married     Family History: The patient's family history includes Asthma in her mother; COPD in her mother; Cancer (  age of onset: 27) in her brother; Coronary artery disease (age of onset: 13) in her father; Diabetes in her brother; Heart disease in her father and mother; Osteoarthritis in her mother; Stroke in her father.  ROS:   Please see the history of present illness.     All other systems reviewed and are negative.  EKGs/Labs/Other Studies Reviewed:    The following studies were reviewed today: Coronary calcium score 06/30/2020  EKG:  EKG is  ordered today.  The ekg ordered today demonstrates normal sinus rhythm, normal tracing.  QTc 388 ms.  Recent Labs: No results found for requested labs within last 365 days.  Recent Lipid Panel    Component Value Date/Time   CHOL 148 05/04/2021 1141   TRIG 71 05/04/2021 1141   HDL 68 05/04/2021 1141   CHOLHDL 2.2 05/04/2021 1141   CHOLHDL 3.0 10/20/2014 0941   VLDL 25 10/20/2014 0941   LDLCALC 66 05/04/2021 1141   LDLDIRECT 47 03/29/2021 1145     Risk Assessment/Calculations:                Physical Exam:    VS:  BP 139/77   Pulse 74   Ht  (1.753 m)   Wt 197 lb 9.6 oz (89.6 kg)   SpO2 100%   BMI 29.18 kg/m     Wt Readings from Last 3 Encounters:  07/04/22 197 lb 9.6 oz (89.6 kg)  02/09/22 195 lb (88.5 kg)  01/24/22 197 lb 6.4 oz (89.5 kg)     GEN:  Overweight, but appears fit well nourished, well developed in no acute distress HEENT: Normal NECK: No JVD; No carotid bruits LYMPHATICS: No lymphadenopathy CARDIAC: RRR, no murmurs, rubs, gallops RESPIRATORY:  Clear to auscultation without rales, wheezing or rhonchi  ABDOMEN: Soft, non-tender, non-distended MUSCULOSKELETAL:  No edema; No deformity  SKIN: Warm and dry NEUROLOGIC:  Alert and oriented x 3 PSYCHIATRIC:  Normal affect   ASSESSMENT:    1. Palpitations   2. Coronary artery disease involving native coronary artery of native heart without angina pectoris   3. Dyslipidemia, goal LDL below 70   4. Controlled type 2 diabetes mellitus without complication, without long-term current use of insulin   5. Essential hypertension, benign    PLAN:    In order of problems listed above:  Palpitations: Since she made the appointment these complaints have subsided.  They were never severe and were not associated with physical activity.  Will hold off further evaluation at this time, but can consider using a commercially available rhythm monitor such as Kardia mobile to detect the palpitation source, should they recur. CAD: Her coronary calcium score places her in a CAD-equivalent category, but she is completely asymptomatic despite being very physically active.  I do not think aggressive imaging evaluation is necessary at this time.  Continue to focus on risk factor management. HLP: Most recent labs show excellent control of her lipid parameters on a very low-dose of rosuvastatin.  Plan to recheck her lipid profile.  Dr. Rennis Golden is managing this. DM: Adequate glycemic control. HTN: Fair control.  No change in medications.           Medication Adjustments/Labs and Tests Ordered: Current medicines are reviewed at length with the patient today.  Concerns regarding medicines are outlined above.  Orders Placed This Encounter  Procedures   Lipid panel   EKG 12-Lead   No orders of the  defined types were placed in this encounter.   Patient  Instructions  Medication Instructions:  No changes *If you need a refill on your cardiac medications before your next appointment, please call your pharmacy*   Lab Work: Lipid panel If you have labs (blood work) drawn today and your tests are completely normal, you will receive your results only by: MyChart Message (if you have MyChart) OR A paper copy in the mail If you have any lab test that is abnormal or we need to change your treatment, we will call you to review the results.  Follow-Up: At [facility], you and your health needs are our priority.  As part of our continuing mission to provide you with exceptional heart care, we have created designated Provider Care Teams.  These Care Teams include your primary Cardiologist (physician) and Advanced Practice Providers (APPs -  Physician Assistants and Nurse Practitioners) who all work together to provide you with the care you need, when you need it.  We recommend signing up for the patient portal called "MyChart".  Sign up information is provided on this After Visit Summary.  MyChart is used to connect with patients for Virtual Visits (Telemedicine).  Patients are able to view lab/test results, encounter notes, upcoming appointments, etc.  Non-urgent messages can be sent to your provider as well.   To learn more about what you can do with MyChart, go to [email or web address].    Your next appointment:   6 month(s)  Provider:   Dr Royann Shivers    Signed, Thurmon Fair, MD  07/04/2022 1:03 PM    [facility]

## 2022-07-04 NOTE — Patient Instructions (Signed)
Medication Instructions:  No changes *If you need a refill on your cardiac medications before your next appointment, please call your pharmacy*   Lab Work: Lipid panel If you have labs (blood work) drawn today and your tests are completely normal, you will receive your results only by: MyChart Message (if you have MyChart) OR A paper copy in the mail If you have any lab test that is abnormal or we need to change your treatment, we will call you to review the results.  Follow-Up: At [facility], you and your health needs are our priority.  As part of our continuing mission to provide you with exceptional heart care, we have created designated Provider Care Teams.  These Care Teams include your primary Cardiologist (physician) and Advanced Practice Providers (APPs -  Physician Assistants and Nurse Practitioners) who all work together to provide you with the care you need, when you need it.  We recommend signing up for the patient portal called "MyChart".  Sign up information is provided on this After Visit Summary.  MyChart is used to connect with patients for Virtual Visits (Telemedicine).  Patients are able to view lab/test results, encounter notes, upcoming appointments, etc.  Non-urgent messages can be sent to your provider as well.   To learn more about what you can do with MyChart, go to [email or web address].    Your next appointment:   6 month(s)  Provider:   Dr Royann Shivers

## 2022-07-05 LAB — LIPID PANEL
Chol/HDL Ratio: 2.5 ratio (ref 0.0–4.4)
Cholesterol, Total: 163 mg/dL (ref 100–199)
HDL: 64 mg/dL (ref >39)
LDL Chol Calc (NIH): 79 mg/dL (ref 0–99)
Triglycerides: 112 mg/dL (ref 0–149)
VLDL Cholesterol Cal: 20 mg/dL (ref 5–40)

## 2022-07-14 ENCOUNTER — Ambulatory Visit (INDEPENDENT_AMBULATORY_CARE_PROVIDER_SITE_OTHER): Payer: Medicare Other | Admitting: Family Medicine

## 2022-07-14 ENCOUNTER — Other Ambulatory Visit: Payer: Self-pay

## 2022-07-14 ENCOUNTER — Encounter: Payer: Self-pay | Admitting: Family Medicine

## 2022-07-14 VITALS — BP 145/62 | HR 69 | Ht 69.0 in | Wt 199.8 lb

## 2022-07-14 DIAGNOSIS — E785 Hyperlipidemia, unspecified: Secondary | ICD-10-CM

## 2022-07-14 DIAGNOSIS — I10 Essential (primary) hypertension: Secondary | ICD-10-CM | POA: Diagnosis not present

## 2022-07-14 DIAGNOSIS — E1169 Type 2 diabetes mellitus with other specified complication: Secondary | ICD-10-CM

## 2022-07-14 LAB — POCT GLYCOSYLATED HEMOGLOBIN (HGB A1C): HbA1c, POC (controlled diabetic range): 6.9 % (ref 0.0–7.0)

## 2022-07-14 MED ORDER — LISINOPRIL 40 MG PO TABS: 40.0000 mg | ORAL_TABLET | Freq: Every day | ORAL | 3 refills | Status: DC

## 2022-07-14 MED ORDER — AMLODIPINE BESYLATE 10 MG PO TABS: 10.0000 mg | ORAL_TABLET | Freq: Every day | ORAL | 3 refills | Status: DC

## 2022-07-14 MED ORDER — GLYBURIDE 2.5 MG PO TABS: ORAL_TABLET | ORAL | 3 refills | Status: DC

## 2022-07-14 MED ORDER — METFORMIN HCL 1000 MG PO TABS: 1000.0000 mg | ORAL_TABLET | Freq: Two times a day (BID) | ORAL | 2 refills | Status: DC

## 2022-07-14 MED ORDER — ROSUVASTATIN CALCIUM 5 MG PO TABS: 5.0000 mg | ORAL_TABLET | ORAL | 3 refills | Status: DC

## 2022-07-14 NOTE — Progress Notes (Signed)
    SUBJECTIVE:   CHIEF COMPLAINT / HPI:   Tiffany Becker is a 77 yo female who presents to the clinic for follow up on HTN and DM2.  HTN Currently taking amlodipine 5 mg daily and lisinopril 40 mg daily. She takes her BP at home and says they are usually 139/65-70. BP today was 145/62 on repeat. Denies chest pain, headaches, nausea or vomiting.  Type II Diabetes Taking metformin 1000 mg bid and glyburide 1.25 mg daily. A1c was 6.9 today. She denies any hypoglycemic episodes and has blood sugar monitor if needed.  PERTINENT  PMH / PSH: HTN, DM2, dyslipidemia  OBJECTIVE:   BP (!) 145/62   Pulse 69   Ht 5\' 9"  (1.753 m)   Wt 199 lb 12.8 oz (90.6 kg)   SpO2 99%   BMI 29.51 kg/m    Gen: alert, well appearing, in no acute distress HEENT: normocephalic, atraumatic CV: RRR, no MRG Pulm: normal WOB, clear to auscultation bilaterally Ab: normoactive bowel sounds, no tenderness or masses Ext: no lower extremity edema  ASSESSMENT/PLAN:   Essential hypertension, benign BP was elevated to 145/62 on repeat. Increasing dose of amlodipine to 10 mg daily and refilled lisinopril. Scheduled to have BP rechecked in a few weeks with Dr. Raymondo Band. Follow up in 6 months.   type 2 diabetes mellitus (HCC) complicated by dyslipidemia  Taking metformin 1000 mg bid and glyburide 1.25 mg daily. A1c was 6.9 today. DM2 is well controlled. Refilled meds. Getting BMP to assess kidney function today. Continue current regimen and follow up in 6 months.  Dyslipidemia Lipid panel WNL at [facility] on 07/04/22. Continue current regimen.  Taking statin only 3 times a week as this is all she can tolerate.   Health Maintenance  Declined zoster and Covid vaccines today.  Barrett Shell, Medical Student  [facility]   Patient seen along with medical student Will Dabbs. I personally evaluated this patient along with the student, and verified all aspects of the history, physical exam, and  medical decision making as documented by the student. I agree with the student's documentation and have made all necessary edits.  Levert Feinstein, MD  [facility]

## 2022-07-14 NOTE — Assessment & Plan Note (Signed)
BP was elevated to 145/62 on repeat. Increasing dose of amlodipine to 10 mg daily and refilled lisinopril. Scheduled to have BP rechecked in a few weeks with Dr. Raymondo Band. Follow up in 6 months.

## 2022-07-14 NOTE — Assessment & Plan Note (Addendum)
Taking metformin 1000 mg bid and glyburide 1.25 mg daily. A1c was 6.9 today. DM2 is well controlled. Refilled meds. Getting BMP to assess kidney function today. Continue current regimen and follow up in 6 months.

## 2022-07-14 NOTE — Assessment & Plan Note (Addendum)
Lipid panel WNL at [facility] on 07/04/22. Continue current regimen.  Taking statin only 3 times a week as this is all she can tolerate.

## 2022-07-14 NOTE — Patient Instructions (Signed)
It was great to see you again today.  Increase amlodipine to 10 mg daily. Follow-up with Dr. Raymondo Band in a couple of weeks as scheduled Refilled all your medications. Follow-up with me in 6 months, sooner if needed  Be well, Dr. Pollie Meyer

## 2022-07-15 LAB — BASIC METABOLIC PANEL
BUN/Creatinine Ratio: 14 (ref 12–28)
BUN: 12 mg/dL (ref 8–27)
CO2: 21 mmol/L (ref 20–29)
Calcium: 9.5 mg/dL (ref 8.7–10.3)
Chloride: 99 mmol/L (ref 96–106)
Creatinine, Ser: 0.88 mg/dL (ref 0.57–1.00)
Glucose: 136 mg/dL — ABNORMAL HIGH (ref 70–99)
Potassium: 5.2 mmol/L (ref 3.5–5.2)
Sodium: 137 mmol/L (ref 134–144)
eGFR: 68 mL/min/{1.73_m2} (ref >59)

## 2022-07-18 ENCOUNTER — Telehealth: Payer: Self-pay

## 2022-07-18 MED ORDER — GLIPIZIDE ER 2.5 MG PO TB24: 2.5000 mg | ORAL_TABLET | Freq: Every day | ORAL | 3 refills | Status: DC

## 2022-07-18 NOTE — Telephone Encounter (Signed)
Sent in rx for glipizide XL 2.5mg  daily.  Please inform patient. Please ask her to keep a closer monitor on her blood sugars while she adjusts to this new medication. I don't think it will drop her sugars low but just want her to keep an eye on it in case.  Latrelle Dodrill, MD

## 2022-07-18 NOTE — Telephone Encounter (Signed)
[facility] sent over a fax letting the provider know that the patient's insurance will not cover Diabeta 2.5mg , an alternative medicine called Glipizide ,ER, or XL tab will be covered under the insurance. Please send the alternative medicine to [facility] rx, thank you. Penni Bombard CMA

## 2022-07-19 NOTE — Telephone Encounter (Signed)
Tried to call patient but no answer, will try again later. Penni Bombard CMA

## 2022-07-19 NOTE — Telephone Encounter (Signed)
Called patient she is aware to be on a lookout for low sugars and new medication. Penni Bombard CMA

## 2022-07-22 DIAGNOSIS — M1711 Unilateral primary osteoarthritis, right knee: Secondary | ICD-10-CM | POA: Diagnosis not present

## 2022-07-26 ENCOUNTER — Ambulatory Visit (INDEPENDENT_AMBULATORY_CARE_PROVIDER_SITE_OTHER): Payer: Medicare Other | Admitting: Pharmacist

## 2022-07-26 ENCOUNTER — Encounter: Payer: Self-pay | Admitting: Pharmacist

## 2022-07-26 VITALS — BP 157/67 | HR 64 | Ht 67.32 in | Wt 201.0 lb

## 2022-07-26 DIAGNOSIS — I10 Essential (primary) hypertension: Secondary | ICD-10-CM

## 2022-07-26 DIAGNOSIS — E785 Hyperlipidemia, unspecified: Secondary | ICD-10-CM

## 2022-07-26 MED ORDER — BEMPEDOIC ACID 180 MG PO TABS
180.0000 mg | ORAL_TABLET | Freq: Every day | ORAL | 6 refills | Status: DC
Start: 2022-07-26 — End: 2022-08-30

## 2022-07-26 NOTE — Assessment & Plan Note (Signed)
-  Patient reports muscle and joint pain intermittently with use of three times weekly rosuvastatin 5mg .  She did not have this complaint today.   -Elevated LDL at 110 mg/dL and above goal of <16 -Initiated on bempedoic acid (Nexletol) 180 mg by mouth once daily.  -Continued taking rosuvastatin (Crestor) 5 mg three times a week. May deescalate to twice weekly. based on LDL post adequate trial on bempedoic acid (Nexletol). -Check lipid panel on next visit 08/02/2022

## 2022-07-26 NOTE — Assessment & Plan Note (Signed)
Hypertension currently uncontrolled on current medications. BP goal <130/80 mmHg. Medication adherence appears optimal. Control is suboptimal due to continued elevated BP measurements.  -No changes to current medications. -Scheduled patient to return and initiate Amb BP Monitor on 08/02/2022. Patient advised to bring home BP monitor on next visit with pharmacy.  -Counseled on lifestyle modifications for blood pressure control including reduced dietary sodium, increased exercise, adequate sleep. -Encouraged patient to check BP at home and bring log of readings to next visit. Counseled on proper use of home BP cuff.

## 2022-07-26 NOTE — Patient Instructions (Addendum)
It was nice to see you today!  Your goal blood pressure is <140 mmHg systolic.  Medication Changes:  No changes to your hypertension medications. Return on Tuesday (08/02/2022) at 9:00 AM to start 24-hour BP monitoring.  Start bempedoic acid (Nexletol) 180 mg by mouth once daily.   Keep taking rosuvastatin (Crestor) 5 mg three times a week.  Monitor blood pressure at home daily and keep a log (on your phone or piece of paper) to bring with you to your next visit. Write down date, time, blood pressure and pulse.  Keep up the good work with diet and exercise. Aim for a diet full of vegetables, fruit and lean meats (chicken, [place], fish). Try to limit salt intake by eating fresh or frozen vegetables (instead of canned), rinse canned vegetables prior to cooking and do not add any additional salt to meals.

## 2022-07-26 NOTE — Progress Notes (Signed)
S:     Chief Complaint  Patient presents with   Medication Management    BP Follow-Up   77 y.o. female who presents for hypertension evaluation, education, and management.  PMH is significant for Benign Essential hypertension, T2DM, and dyslipidemia,.  Patient was referred and last seen by Primary Care Provider, Dr. Pollie Meyer, on 07/14/2022.  At last visit, patient was seen for HTN and T2DM follow-up. Her BP was elevated at 145/62 and her amlodipine (Norvasc) was increased to 10 mg once daily. No changes to all other medications at her last visit.   Today, patient arrives in good spirits and presents without assistance. Denies dizziness, headache, blurred vision, swelling.   Medication adherence is optimal. Patient has taken BP medications today.   Current antihypertensives include: amlodipine besylate (Norvasc) 10 mg QD, lisinopril (Zestril) 40 mg QD  Reported home BP readings: values ranging from 130-140 mmHg systolic with several values in the 150 mmHg range.   O:  Review of Systems  All other systems reviewed and are negative.   Physical Exam Constitutional:      Appearance: Normal appearance.  Pulmonary:     Effort: Pulmonary effort is normal.  Neurological:     Mental Status: She is alert.  Psychiatric:        Mood and Affect: Mood normal.        Behavior: Behavior normal.        Thought Content: Thought content normal.     Last 3 Office BP readings: BP Readings from Last 3 Encounters:  07/26/22 (!) 157/67  07/14/22 (!) 145/62  07/04/22 139/77    BMET    Component Value Date/Time   NA 137 07/14/2022 1218   K 5.2 07/14/2022 1218   CL 99 07/14/2022 1218   CO2 21 07/14/2022 1218   GLUCOSE 136 (H) 07/14/2022 1218   GLUCOSE 150 (H) 05/14/2019 1051   BUN 12 07/14/2022 1218   CREATININE 0.88 07/14/2022 1218   CREATININE 0.85 10/28/2014 1056   CALCIUM 9.5 07/14/2022 1218   GFRNONAA 63 02/13/2020 0835   GFRAA 72 02/13/2020 0835    Renal  function: Estimated Creatinine Clearance: 64.4 mL/min (by C-G formula based on SCr of 0.88 mg/dL).  Clinical ASCVD: No  The 10-year ASCVD risk score (Arnett DK, et al., 2019) is: 49.3%   Values used to calculate the score:     Age: 33 years     Sex: Female     Is Non-Hispanic African American: No     Diabetic: Yes     Tobacco smoker: No     Systolic Blood Pressure: 157 mmHg     Is BP treated: Yes     HDL Cholesterol: 64 mg/dL     Total Cholesterol: 163 mg/dL  Patient covered under [employer].  A/P: Hypertension currently uncontrolled on current medications. BP goal <130/80 mmHg. Medication adherence appears optimal. Control is suboptimal due to continued elevated BP measurements.  -No changes to current medications. -Scheduled patient to return and initiate Amb BP Monitor on 08/02/2022. Patient advised to bring home BP monitor on next visit with pharmacy.  -Counseled on lifestyle modifications for blood pressure control including reduced dietary sodium, increased exercise, adequate sleep. -Encouraged patient to check BP at home and bring log of readings to next visit. Counseled on proper use of home BP cuff.   Hyperlipidemia: -Patient reports muscle and joint pain intermittently with use of three times weekly rosuvastatin 5mg .  She did not have this  complaint today.   -Elevated LDL at 110 mg/dL and above goal of <96 -Initiated on bempedoic acid (Nexletol) 180 mg by mouth once daily.  -Continued taking rosuvastatin (Crestor) 5 mg three times a week. May deescalate to twice weekly. based on LDL post adequate trial on bempedoic acid (Nexletol). -Check lipid panel on next visit 08/02/2022  Results reviewed and written information provided.    Written patient instructions provided. Patient verbalized understanding of treatment plan.  Total time in face to face counseling 26 minutes.    Follow-up:  Pharmacist on 08/02/2022. Patient seen with Haze Boyden  PharmD Candidate and Bing Plume, PharmD Candidate.

## 2022-07-27 NOTE — Progress Notes (Signed)
Reviewed and agree with Dr Koval's plan.   

## 2022-07-29 DIAGNOSIS — L820 Inflamed seborrheic keratosis: Secondary | ICD-10-CM | POA: Diagnosis not present

## 2022-07-29 DIAGNOSIS — Z08 Encounter for follow-up examination after completed treatment for malignant neoplasm: Secondary | ICD-10-CM | POA: Diagnosis not present

## 2022-07-29 DIAGNOSIS — M1711 Unilateral primary osteoarthritis, right knee: Secondary | ICD-10-CM | POA: Diagnosis not present

## 2022-07-29 DIAGNOSIS — Z85828 Personal history of other malignant neoplasm of skin: Secondary | ICD-10-CM | POA: Diagnosis not present

## 2022-08-01 ENCOUNTER — Encounter (HOSPITAL_COMMUNITY): Payer: Self-pay | Admitting: Emergency Medicine

## 2022-08-01 ENCOUNTER — Emergency Department (HOSPITAL_COMMUNITY): Payer: Medicare Other

## 2022-08-01 ENCOUNTER — Other Ambulatory Visit: Payer: Self-pay

## 2022-08-01 ENCOUNTER — Emergency Department (HOSPITAL_COMMUNITY)
Admission: EM | Admit: 2022-08-01 | Discharge: 2022-08-01 | Disposition: A | Discharge status: Home / Self Care | Payer: Medicare Other | Attending: Emergency Medicine | Admitting: Emergency Medicine

## 2022-08-01 ENCOUNTER — Telehealth: Payer: Self-pay

## 2022-08-01 DIAGNOSIS — D72829 Elevated white blood cell count, unspecified: Secondary | ICD-10-CM | POA: Diagnosis not present

## 2022-08-01 DIAGNOSIS — I10 Essential (primary) hypertension: Secondary | ICD-10-CM | POA: Diagnosis not present

## 2022-08-01 DIAGNOSIS — I4891 Unspecified atrial fibrillation: Secondary | ICD-10-CM

## 2022-08-01 DIAGNOSIS — R002 Palpitations: Secondary | ICD-10-CM | POA: Diagnosis present

## 2022-08-01 DIAGNOSIS — E119 Type 2 diabetes mellitus without complications: Secondary | ICD-10-CM | POA: Insufficient documentation

## 2022-08-01 DIAGNOSIS — Z79899 Other long term (current) drug therapy: Secondary | ICD-10-CM | POA: Diagnosis not present

## 2022-08-01 DIAGNOSIS — I499 Cardiac arrhythmia, unspecified: Secondary | ICD-10-CM | POA: Diagnosis not present

## 2022-08-01 DIAGNOSIS — Z743 Need for continuous supervision: Secondary | ICD-10-CM | POA: Diagnosis not present

## 2022-08-01 DIAGNOSIS — Z7984 Long term (current) use of oral hypoglycemic drugs: Secondary | ICD-10-CM | POA: Insufficient documentation

## 2022-08-01 DIAGNOSIS — R000 Tachycardia, unspecified: Secondary | ICD-10-CM | POA: Diagnosis not present

## 2022-08-01 DIAGNOSIS — R6889 Other general symptoms and signs: Secondary | ICD-10-CM | POA: Diagnosis not present

## 2022-08-01 LAB — BASIC METABOLIC PANEL
Anion gap: 11 (ref 5–15)
BUN: 16 mg/dL (ref 8–23)
CO2: 21 mmol/L — ABNORMAL LOW (ref 22–32)
Calcium: 9.3 mg/dL (ref 8.9–10.3)
Chloride: 99 mmol/L (ref 98–111)
Creatinine, Ser: 0.91 mg/dL (ref 0.44–1.00)
GFR, Estimated: >60 mL/min (ref >60)
Glucose, Bld: 192 mg/dL — ABNORMAL HIGH (ref 70–99)
Potassium: 4.2 mmol/L (ref 3.5–5.1)
Sodium: 131 mmol/L — ABNORMAL LOW (ref 135–145)

## 2022-08-01 LAB — CBC
HCT: 45.5 % (ref 36.0–46.0)
Hemoglobin: 15 g/dL (ref 12.0–15.0)
MCH: 30.1 pg (ref 26.0–34.0)
MCHC: 33 g/dL (ref 30.0–36.0)
MCV: 91.4 fL (ref 80.0–100.0)
Platelets: 236 10*3/uL (ref 150–400)
RBC: 4.98 MIL/uL (ref 3.87–5.11)
RDW: 12.7 % (ref 11.5–15.5)
WBC: 11 10*3/uL — ABNORMAL HIGH (ref 4.0–10.5)
nRBC: 0 % (ref 0.0–0.2)

## 2022-08-01 LAB — MAGNESIUM: Magnesium: 1.7 mg/dL (ref 1.7–2.4)

## 2022-08-01 LAB — TSH: TSH: 0.723 u[IU]/mL (ref 0.350–4.500)

## 2022-08-01 MED ORDER — APIXABAN 5 MG PO TABS
5.0000 mg | ORAL_TABLET | Freq: Once | ORAL | Status: AC
  Administered 2022-08-01: 5 mg via ORAL
  Filled 2022-08-01: qty 1

## 2022-08-01 MED ORDER — APIXABAN 5 MG PO TABS: 5.0000 mg | ORAL_TABLET | Freq: Two times a day (BID) | ORAL | 0 refills | Status: DC

## 2022-08-01 MED ORDER — DILTIAZEM HCL 30 MG PO TABS: 30.0000 mg | ORAL_TABLET | Freq: Three times a day (TID) | ORAL | 0 refills | Status: DC | PRN

## 2022-08-01 NOTE — Telephone Encounter (Signed)
Dr. Raymondo Band can you weigh in on this?  Thanks Latrelle Dodrill, MD

## 2022-08-01 NOTE — ED Triage Notes (Signed)
Pt BIB [facility] from home. Pt c/o "flutter feeling" and abd pain. Per EMS pt was in Afib HR 140-160s on arrival, after 10mg  Cardizem IV, pt HR converted to the 80s. Pt denies pain or SOB.

## 2022-08-01 NOTE — Telephone Encounter (Signed)
[facility] faxed over a request for a alternative medicine, patient's insurance does not cover Nexletol 180 mg tablet. Insurance will cover a medicine called Ezetimibe 10 mg,Atorvastatin 10mg , or Lovastatin 10 mg. Please correct the adjustments. Thank you. Penni Bombard CMA

## 2022-08-01 NOTE — ED Provider Notes (Signed)
[place] EMERGENCY DEPARTMENT AT [facility] Provider Note  CSN: [id number] Arrival date & time: 08/01/22 0134  Chief Complaint(s) Atrial Fibrillation  HPI Tiffany Becker is a 77 y.o. female with a past medical history listed below who presents to the emergency department for rapid palpitations noted to be in A-fib RVR by EMS.  Symptoms began earlier in the evening.  Patient waited in when her symptoms did not resolve on their own, she called EMS.  They gave patient 10 mg of Cardizem and IV fluids.  Patient converted shortly afterward.  She denies any recent fevers or infections.  No coughing or congestion.  No nausea or vomiting.  No abdominal pain.  No diarrhea.  No recent medication changes.   Atrial Fibrillation    Past Medical History Past Medical History:  Diagnosis Date   Abnormal renal ultrasound 04/25/2002   Diabetes mellitus without complication (HCC)    Diverticulosis of colon 08/12/2004   GERD (gastroesophageal reflux disease)    H pylori neg 08/21/02   Hypertension    Peptic ulcer disease 03/14/1980   EGD   Primary osteoarthritis of both hips 06/13/2007   Dr Turner Daniels   Pyelonephritis 01/12/2001   Vitreous detachment    Patient Active Problem List   Diagnosis Date Noted   Bacterial sinusitis 04/27/2021   Acute bacterial rhinosinusitis 03/12/2020   Myopathy 02/04/2020   UTI (urinary tract infection) 09/20/2018   Vitamin D deficiency 04/27/2018   Bereavement 06/28/2016   Bilateral shoulder pain 03/03/2015   Osteopenia 10/30/2014   Estrogen deficiency 10/20/2014   Trigger finger 10/07/2013   Cataracts, bilateral 07/26/2013   Neoplasm of uncertain behavior of skin 07/23/2013   Preventative health care 07/22/2013   Glaucoma suspect 05/09/2011   Nuclear cataract 05/09/2011   Essential hypertension, benign 11/03/2009   Osteoarthritis of both knees 11/03/2009   Osteoarthritis 08/09/2007   HALLUX RIGIDUS, ACQUIRED 12/26/2006    type 2 diabetes mellitus  (HCC) complicated by dyslipidemia  05/11/2006   Dyslipidemia 05/11/2006   OBESITY, NOS 05/11/2006   DDD (degenerative disc disease), cervical 05/11/2006   Home Medication(s) Prior to Admission medications   Medication Sig Start Date End Date Taking? Authorizing Provider  amLODipine (NORVASC) 10 MG tablet Take 1 tablet (10 mg total) by mouth daily. 07/14/22   Latrelle Dodrill, MD  baclofen (LIORESAL) 10 MG tablet Take 0.5 tablets (5 mg total) by mouth 2 (two) times daily as needed for muscle spasms. Patient not taking: Reported on 07/26/2022 03/03/22   Latrelle Dodrill, MD  Bempedoic Acid 180 MG TABS Take 1 tablet (180 mg total) by mouth daily. 07/26/22   McDiarmid, Leighton Roach, MD  celecoxib (CELEBREX) 100 MG capsule TAKE 1 CAPSULE BY MOUTH  DAILY AS NEEDED 07/15/20   Latrelle Dodrill, MD  glipiZIDE (GLUCOTROL XL) 2.5 MG 24 hr tablet Take 1 tablet (2.5 mg total) by mouth daily with breakfast. 07/18/22   Latrelle Dodrill, MD  lisinopril (ZESTRIL) 40 MG tablet Take 1 tablet (40 mg total) by mouth daily. 07/14/22   Latrelle Dodrill, MD  metFORMIN (GLUCOPHAGE) 1000 MG tablet Take 1 tablet (1,000 mg total) by mouth 2 (two) times daily with a meal. 07/14/22   Latrelle Dodrill, MD  Multiple Vitamins-Minerals (MULTIVITAMIN GUMMIES ADULT PO) Take by mouth.    [provider]  rosuvastatin (CRESTOR) 5 MG tablet Take 1 tablet (5 mg total) by mouth every Monday, Wednesday, and Friday. 07/15/22   Latrelle Dodrill, MD  VITAMIN D  PO Take by mouth. Patient not taking: Reported on 07/26/2022    [provider]  metFORMIN (GLUMETZA) 1000 MG (MOD) 24 hr tablet Take 1 tablet (1,000 mg total) by mouth daily with breakfast. 07/06/21   Latrelle Dodrill, MD                                                                                                                                    Allergies Statins  Review of Systems Review of Systems As noted in HPI  Physical Exam Vital Signs   I have reviewed the triage vital signs BP (!) 171/81   Pulse 97   Temp (!) 97.5 F (36.4 C)   Resp 19   SpO2 98%   Physical Exam Vitals reviewed.  Constitutional:      General: She is not in acute distress.    Appearance: She is well-developed. She is not diaphoretic.  HENT:     Head: Normocephalic and atraumatic.     Nose: Nose normal.  Eyes:     General: No scleral icterus.       Right eye: No discharge.        Left eye: No discharge.     Conjunctiva/sclera: Conjunctivae normal.     Pupils: Pupils are equal, round, and reactive to light.  Cardiovascular:     Rate and Rhythm: Normal rate and regular rhythm.     Heart sounds: No murmur heard.    No friction rub. No gallop.  Pulmonary:     Effort: Pulmonary effort is normal. No respiratory distress.     Breath sounds: Normal breath sounds. No stridor. No rales.  Abdominal:     General: There is no distension.     Palpations: Abdomen is soft.     Tenderness: There is no abdominal tenderness.  Musculoskeletal:        General: No tenderness.     Cervical back: Normal range of motion and neck supple.  Skin:    General: Skin is warm and dry.     Findings: No erythema or rash.  Neurological:     Mental Status: She is alert and oriented to person, place, and time.     ED Results and Treatments Labs (all labs ordered are listed, but only abnormal results are displayed) Labs Reviewed - No data to display  EKG   Radiology No results found.  Medications Ordered in ED Medications - No data to display Procedures Procedures  (including critical care time) Medical Decision Making / ED Course  Click here for ABCD2, HEART and other calculators  Medical Decision Making Amount and/or Complexity of Data Reviewed Labs: ordered. Radiology: ordered.  Risk Prescription drug management.    The patient  presents to the ED for the following: A-fib RVR  Key initial findings: Converted after Cardizem and IV fluids by EMS   This involves an extensive number of treatment options, and is a complaint that carries with it a high risk of complications and morbidity. The differential diagnosis includes but not limited to:  Will assess for any electrolyte or metabolic derangements, will check thyroid panel, possible valve prolapse/valvular disease.  Exam not concerning for heart failure.  Doubt PE.   Work up Interpretation and Management:  Cardiac Monitoring/EKG: Telemetry with normal sinus rhythm with rates in the 80s. EKG without acute ischemic changes, dysrhythmias, blocks.  Laboratory Tests ordered listed below with my independent interpretation: CBC with mild leukocytosis.  No anemia. Metabolic panel with mild hyponatremia.  No other significant electrolyte derangements.  Hyperglycemia without DKA.  No renal insufficiency. TSH normal    Imaging Studies ordered listed below with my independent interpretation: Chest x-ray without evidence of pneumonia, pneumothorax, pulmonary edema or pleural effusion.  ED Course: Patient remained hemodynamically stable without recurrence of her A-fib during her 3-hour stay. CHA2DS2-VASc score of 6. Will start on Eliquis and prescribe diltiazem as needed. A-fib clinic follow-up      Final Clinical Impression(s) / ED Diagnoses Final diagnoses:  Atrial fibrillation with rapid ventricular response (HCC)   The patient appears reasonably screened and/or stabilized for discharge and I doubt any other medical condition or other [facility] requiring further screening, evaluation, or treatment in the ED at this time. I have discussed the findings, Dx and Tx plan with the patient/family who expressed understanding and agree(s) with the plan. Discharge instructions discussed at length. The patient/family was given strict return precautions who verbalized understanding  of the instructions. No further questions at time of discharge.  Disposition: Discharge  Condition: Good  ED Discharge Orders          Ordered    Amb referral to AFIB Clinic        08/01/22 0154    apixaban (ELIQUIS) 5 MG TABS tablet  2 times daily        08/01/22 0344    diltiazem (CARDIZEM) 30 MG tablet  3 times daily PRN        08/01/22 0344            Follow Up: Latrelle Dodrill, MD [address] [place] [address] [phone]  Call  to schedule an appointment for close follow up  [facility] Atrial Fibrillation [facility] at [facility] [address] [id number] mc [place] [address] [phone] Call  if you have not been called about your appointment, in 3-5 days           This chart was dictated using voice recognition software.  Despite best efforts to proofread,  errors can occur which can change the documentation meaning.    Nira Conn, MD 08/01/22 [phone]

## 2022-08-02 ENCOUNTER — Ambulatory Visit (INDEPENDENT_AMBULATORY_CARE_PROVIDER_SITE_OTHER): Payer: Medicare Other | Admitting: Pharmacist

## 2022-08-02 ENCOUNTER — Encounter: Payer: Self-pay | Admitting: Pharmacist

## 2022-08-02 VITALS — BP 159/68 | HR 73 | Wt 200.0 lb

## 2022-08-02 DIAGNOSIS — I10 Essential (primary) hypertension: Secondary | ICD-10-CM

## 2022-08-02 NOTE — Progress Notes (Signed)
S:     Chief Complaint  Patient presents with   Medication Management    Amb BP Monitor - Day 1   77 y.o. female who presents for hypertension evaluation, education, and management.  PMH is significant for hypertension, T2DM, dyslipidemia, and recent ED visit for atrial fibrillation. Patient was referred and last seen by Primary Care Provider, Dr. Pollie Meyer, on 07/14/22.  At last Rx visit, planned for ambulatory BP monitor. Started bempedoic acid 180 mg for dyslipidemia, but it is not covered by insurance.    Medication compliance is reported to be good.  Family history: Mother - atrial fibrillation. Father - heart disease, hypertension  Discussed procedure for wearing the monitor and gave patient written instructions. Monitor was placed on non-dominant arm with instructions to return in the morning.   Current BP Medications include:  amlodipine 10 mg daily, lisinopril 40 mg daily, diltiazem 30 mg TID PRN HR >110.  Patient brought in home blood pressure monitor. Home systolic BP readings (aside from atrial fibrillation event) in the 130s. In-office BP with same home monitor 159/68.   O:  Review of Systems  All other systems reviewed and are negative.   Physical Exam Constitutional:      Appearance: Normal appearance.  Neurological:     Mental Status: She is alert.  Psychiatric:        Mood and Affect: Mood normal.        Behavior: Behavior normal.        Thought Content: Thought content normal.        Judgment: Judgment normal.     Last 3 Office BP readings: BP Readings from Last 3 Encounters:  08/02/22 (!) 159/68  08/01/22 124/82  07/26/22 (!) 157/67    Clinical Atherosclerotic Cardiovascular Disease (ASCVD): No  The 10-year ASCVD risk score (Arnett DK, et al., 2019) is: 50.2%   Values used to calculate the score:     Age: 55 years     Sex: Female     Is Non-Hispanic African American: No     Diabetic: Yes     Tobacco smoker: No     Systolic Blood Pressure: 159  mmHg     Is BP treated: Yes     HDL Cholesterol: 64 mg/dL     Total Cholesterol: 163 mg/dL  Basic Metabolic Panel    Component Value Date/Time   NA 131 (L) 08/01/2022 0145   NA 137 07/14/2022 1218   K 4.2 08/01/2022 0145   CL 99 08/01/2022 0145   CO2 21 (L) 08/01/2022 0145   GLUCOSE 192 (H) 08/01/2022 0145   BUN 16 08/01/2022 0145   BUN 12 07/14/2022 1218   CREATININE 0.91 08/01/2022 0145   CREATININE 0.85 10/28/2014 1056   CALCIUM 9.3 08/01/2022 0145   GFRNONAA >60 08/01/2022 0145   GFRAA 72 02/13/2020 0835    Renal function: Estimated Creatinine Clearance: 62.1 mL/min (by C-G formula based on SCr of 0.91 mg/dL).   ABPM Study Data: Arm Placement left arm  For Office Goal Goal Systolic BP of <140. BP <130 if tolerated.  ABPM thresholds: Overall BP < 130/80, daytime BP <135/85 mmHg, sleeptime BP <120/70 mmHg    A/P: History of hypertension since 2011 with goal presssure of systolic <130 mmHg if tolerated.  -Placed blood pressure cuff, provided education, patient instructed to wear cuff for 24 hours and return tomorrow to review results. -Consider HCTZ if additional BP control needed.   ASCVD primary prevention currently near control with LDL  79 mg/dL in April 2024 not at goal <70 mg/dL.  -Trial ezetimibe starting tomorrow for dyslipidemia instead of bempedoic acid due to insurance coverage.   Written patient instructions provided including activity/symptom/event log. Patient verbalized understanding of plan. Total time in face to face counseling 31 minutes.    Follow-up: Tomorrow AM - early morning appointment 8:30 AM.  Patient seen with Haze Boyden PharmD Candidate and Bing Plume, PharmD Candidate, Dr. Glendale Chard, DO.

## 2022-08-02 NOTE — Patient Instructions (Addendum)
Blood Pressure Activity Diary Time Lying down/ Sleeping Walking/ Exercise Stressed/ Angry Headache/ Pain Dizzy  9 AM       10 AM       11 AM       12 PM       1 PM       2 PM       Time Lying down/ Sleeping Walking/ Exercise Stressed/ Angry Headache/ Pain Dizzy  3 PM       4 PM        5 PM       6 PM       7 PM       8 PM       Time Lying down/ Sleeping Walking/ Exercise Stressed/ Angry Headache/ Pain Dizzy  9 PM       10 PM       11 PM       12 AM       1 AM       2 AM       3 AM       Time Lying down/ Sleeping Walking/ Exercise Stressed/ Angry Headache/ Pain Dizzy  4 AM       5 AM       6 AM       7 AM       8 AM       9 AM       10 AM        Time you woke up: _________                  Time you went to sleep:__________  Come back tomorrow at 8:30am to have the monitor removed Call the Family [facility] Clinic if you have any questions before then ([phone])  Wearing the Blood Pressure Monitor The cuff will inflate every 20 minutes during the day and every 30 minutes while you sleep. Your blood pressure readings will NOT display after cuff inflation Fill out the blood pressure-activity diary during the day, especially during activities that may affect your reading -- such as exercise, stress, walking, taking your blood pressure medications  Important things to know: Avoid taking the monitor off for the next 24 hours, unless it causes you discomfort or pain. Do NOT get the monitor wet and do NOT dry to clean the monitor with any cleaning products. Do NOT put the monitor on anyone else's arm. When the cuff inflates, avoid excess movement. Let the cuffed arm hang loosely, slightly away from the body. Avoid flexing the muscles or moving the hand/fingers. When you go to sleep, make sure that the hose is not kinked. Remember to fill out the blood pressure activity diary. If you experience severe pain or unusual pain (not associated with getting your blood pressure  checked), remove the monitor.  Troubleshooting:  Code  Troubleshooting   1  Check cuff position, tighten cuff   2, 3  Remain still during reading   4, 87  Check air hose connections and make sure cuff is tight   85, 89  Check hose connections and make tubing is not crimped   86  Push START/STOP to restart reading   88, 91  Retry by pushing START/STOP   90  Replace batteries. If problem persists, remove monitor and bring back to   clinic at follow up   97, 98, 99  Service required - Remove monitor and bring back to clinic at   follow up    

## 2022-08-02 NOTE — Assessment & Plan Note (Signed)
History of hypertension since 2011 with goal presssure of systolic <130 mmHg if tolerated.  -Placed blood pressure cuff, provided education, patient instructed to wear cuff for 24 hours and return tomorrow to review results. -Consider HCTZ if additional BP control needed.

## 2022-08-03 ENCOUNTER — Ambulatory Visit (INDEPENDENT_AMBULATORY_CARE_PROVIDER_SITE_OTHER): Payer: Medicare Other | Admitting: Pharmacist

## 2022-08-03 VITALS — BP 133/64 | Ht 68.0 in | Wt 200.0 lb

## 2022-08-03 DIAGNOSIS — I10 Essential (primary) hypertension: Secondary | ICD-10-CM | POA: Diagnosis not present

## 2022-08-03 DIAGNOSIS — E785 Hyperlipidemia, unspecified: Secondary | ICD-10-CM | POA: Diagnosis not present

## 2022-08-03 MED ORDER — EZETIMIBE 10 MG PO TABS
10.0000 mg | ORAL_TABLET | ORAL | 3 refills | Status: DC
Start: 2022-08-03 — End: 2022-09-07

## 2022-08-03 NOTE — Patient Instructions (Signed)
It was nice to see you today!  Your goal blood pressure is <140/90 mmHg.  Medication Changes: Continue all blood pressure medications and other medications.  Begin taking ezetimibe 10 mg every OTHER day.   Monitor blood pressure at home daily and keep a log (on your phone or piece of paper) to bring with you to your next visit. Write down date, time, blood pressure and pulse.  Keep up the good work with diet and exercise. Aim for a diet full of vegetables, fruit and lean meats (chicken, [place], fish). Try to limit salt intake by eating fresh or frozen vegetables (instead of canned), rinse canned vegetables prior to cooking and do not add any additional salt to meals.

## 2022-08-03 NOTE — Progress Notes (Signed)
Reviewed and agree with Dr Koval's plan.   

## 2022-08-03 NOTE — Assessment & Plan Note (Addendum)
History of hypertension since 2011 currently taking amlodipine 10 mg daily, lisinopril 40 mg daily, and as needed diltiazem for symptoms of afib with goal presssure of <130/80. Elevated readings in-office. Found to have normal blood pressure at home with 24-hour ambulatory blood pressure evaluation which demonstrates an average AWAKE blood pressure of 134/64 mmHg. White-coat hypertension with elevated BP readings often in office.  Nocturnal dipping pattern is abnormal  within 3% for both systolic and diastolic pressures.   -No changes to blood pressure medications. Tolerating current blood pressure medications well without episodes of dizziness.

## 2022-08-03 NOTE — Progress Notes (Signed)
S:     Chief Complaint  Patient presents with   Medication Management    Amb bp monitor day 2   77 y.o. female who presents for hypertension evaluation, education, and management.  PMH is significant for hypertension, T2DM, dyslipidemia, and recent ED visit for atrial fibrillation. Patient returns to clinic with 24 hour blood pressure monitor and reports good adherence. Patient reports they were able to wear the Ambulatory Blood Pressure Cuff for the entire 24 evaluation period.   O:  Review of Systems  All other systems reviewed and are negative.   Physical Exam Constitutional:      Appearance: Normal appearance.  Musculoskeletal:     Comments: Right shoulder pain (osteoarthritis)  Neurological:     Mental Status: She is alert.  Psychiatric:        Mood and Affect: Mood normal.        Behavior: Behavior normal.        Thought Content: Thought content normal.        Judgment: Judgment normal.     Last 3 Office BP readings: BP Readings from Last 3 Encounters:  08/02/22 (!) 159/68  08/01/22 124/82  07/26/22 (!) 157/67    Clinical Atherosclerotic Cardiovascular Disease (ASCVD): No  The 10-year ASCVD risk score (Arnett DK, et al., 2019) is: 50.2%   Values used to calculate the score:     Age: 89 years     Sex: Female     Is Non-Hispanic African American: No     Diabetic: Yes     Tobacco smoker: No     Systolic Blood Pressure: 159 mmHg     Is BP treated: Yes     HDL Cholesterol: 64 mg/dL     Total Cholesterol: 163 mg/dL  Basic Metabolic Panel    Component Value Date/Time   NA 131 (L) 08/01/2022 0145   NA 137 07/14/2022 1218   K 4.2 08/01/2022 0145   CL 99 08/01/2022 0145   CO2 21 (L) 08/01/2022 0145   GLUCOSE 192 (H) 08/01/2022 0145   BUN 16 08/01/2022 0145   BUN 12 07/14/2022 1218   CREATININE 0.91 08/01/2022 0145   CREATININE 0.85 10/28/2014 1056   CALCIUM 9.3 08/01/2022 0145   GFRNONAA >60 08/01/2022 0145   GFRAA 72 02/13/2020 0835    Renal  function: Estimated Creatinine Clearance: 62.9 mL/min (by C-G formula based on SCr of 0.91 mg/dL).   ABPM Study Data: Arm Placement left arm  Overall Mean 24hr BP:   133/64 mmHg  HR: 70  Daytime Mean BP:  134/64 mmHg  HR: 72  Nighttime Mean BP:  131/65 mmHg  HR: 61  Dipping Pattern: No.  Sys:   2.8%   Dia: 2.8%   [normal dipping ~10-20%]  For Office Goal Goal Systolic BP of <140: ABPM thresholds: Overall BP < 130/80, daytime BP <135/85 mmHg, sleeptime BP <120/70 mmHg   A/P: History of hypertension since 2011 currently taking amlodipine 10 mg daily, lisinopril 40 mg daily, and as needed diltiazem for symptoms of afib with goal presssure of <130/80. Elevated readings in-office. Found to have normal blood pressure at home with 24-hour ambulatory blood pressure evaluation which demonstrates an average AWAKE blood pressure of 134/64 mmHg. White-coat hypertension with elevated BP readings often in office.  Nocturnal dipping pattern is abnormal  within 3% for both systolic and diastolic pressures .   -No changes to blood pressure medications. Tolerating current blood pressure medications well without episodes of dizziness.  ASCVD primary prevention near control with LDL 79 mg/dL in April 2024 not at goal <70 mg/dL on rosuvastatin 5 mg three times a week. Ezetimibe preferred by insurance over bempedoic acid. -Trial ezetimibe 10 mg every other day.  -Educated patient on indication, purpose, and potential adverse effects of ezetimibe including risk of myalgias.   Results reviewed and written information provided.    Written patient instructions provided. Patient verbalized understanding of treatment plan.  Total time in face to face counseling 24 minutes.    Follow-up:   Cardiology next week. Lipid clinic in June. Patient seen with Haze Boyden PharmD Candidate

## 2022-08-03 NOTE — Assessment & Plan Note (Signed)
ASCVD primary prevention near control with LDL 79 mg/dL in April 2024 not at goal <70 mg/dL on rosuvastatin 5 mg three times a week. Ezetimibe preferred by insurance over bempedoic acid. -Trial ezetimibe 10 mg every other day.  -Educated patient on indication, purpose, and potential adverse effects of ezetimibe including risk of myalgias.

## 2022-08-05 DIAGNOSIS — M1711 Unilateral primary osteoarthritis, right knee: Secondary | ICD-10-CM | POA: Diagnosis not present

## 2022-08-09 ENCOUNTER — Ambulatory Visit
Admission: RE | Admit: 2022-08-09 | Discharge: 2022-08-09 | Disposition: A | Discharge status: Home / Self Care | Payer: Medicare Other | Source: Ambulatory Visit | Attending: Internal Medicine | Admitting: Internal Medicine

## 2022-08-09 ENCOUNTER — Other Ambulatory Visit: Payer: Self-pay

## 2022-08-09 VITALS — BP 152/62 | HR 90 | Ht 68.0 in | Wt 198.2 lb

## 2022-08-09 DIAGNOSIS — R0683 Snoring: Secondary | ICD-10-CM | POA: Diagnosis not present

## 2022-08-09 DIAGNOSIS — D6869 Other thrombophilia: Secondary | ICD-10-CM | POA: Diagnosis not present

## 2022-08-09 DIAGNOSIS — E669 Obesity, unspecified: Secondary | ICD-10-CM | POA: Insufficient documentation

## 2022-08-09 DIAGNOSIS — Z7901 Long term (current) use of anticoagulants: Secondary | ICD-10-CM | POA: Diagnosis not present

## 2022-08-09 DIAGNOSIS — I480 Paroxysmal atrial fibrillation: Secondary | ICD-10-CM | POA: Diagnosis not present

## 2022-08-09 DIAGNOSIS — Z6830 Body mass index (BMI) 30.0-30.9, adult: Secondary | ICD-10-CM | POA: Diagnosis not present

## 2022-08-09 DIAGNOSIS — I10 Essential (primary) hypertension: Secondary | ICD-10-CM | POA: Insufficient documentation

## 2022-08-09 MED ORDER — DILTIAZEM HCL 30 MG PO TABS: 30.0000 mg | ORAL_TABLET | Freq: Three times a day (TID) | ORAL | 2 refills | Status: DC | PRN

## 2022-08-09 MED ORDER — APIXABAN 5 MG PO TABS: 5.0000 mg | ORAL_TABLET | Freq: Two times a day (BID) | ORAL | 3 refills | Status: DC

## 2022-08-09 NOTE — Progress Notes (Addendum)
Primary Care Physician: Latrelle Dodrill, MD Primary Cardiologist: Dr. Rennis Golden for dyslipidemia Primary Electrophysiologist: None Referring Physician: [facility]   Tiffany Becker is a 77 y.o. female with a history of elevated coronary calcium score - 724 (93rd percentile), HLD, HTN, T2DM, osteoarthritis, atrial fibrillation who presents for consultation in the [facility]. The patient was initially diagnosed with atrial fibrillation on 08/01/22 after presenting to [facility] ED in Afib with RVR. She converted to NSR after cardizem and IV fluids by EMS. Given diltiazem 30 mg prn. Patient is on Eliquis 5 mg BID for a CHADS2VASC score of 6.  On evaluation today, she is currently in NSR. She notes that the day before ED visit she overexerted herself cooking for a family reunion. She notes may have had Afib 5 years ago due to similar symptoms; occurred after she helped her brother move. She has a BP cuff at home which alerts her when she possibly has an abnormal rhythm. She feels when she is in Afib due to palpitations.  She does not drink alcohol. She drinks 1-2 cups of coffee daily. She believes she does snore and at least historically felt like she stopped breathing at night.  She is compliant with anticoagulation and has not missed any doses. She has no bleeding concerns.  Today, she denies symptoms of chest pain, shortness of breath, orthopnea, PND, lower extremity edema, dizziness, presyncope, syncope, snoring, daytime somnolence, bleeding, or neurologic sequela. The patient is tolerating medications without difficulties and is otherwise without complaint today.   Atrial Fibrillation Risk Factors:  she does have symptoms or diagnosis of sleep apnea. she does not have a history of rheumatic fever. she does not have a history of alcohol use. The patient does not have a history of early familial atrial fibrillation or other arrhythmias.  she has a BMI of  Body mass index is 30.14 kg/m.Marland Kitchen Filed Weights   08/09/22 1357  Weight: 89.9 kg    Family History  Problem Relation Age of Onset   COPD Mother    Osteoarthritis Mother    Asthma Mother    Heart disease Mother    Coronary artery disease Father 20       died age 90   Heart disease Father    Stroke Father    Diabetes Brother        bladder, pacemaker, Hemochromatosis   Cancer Brother 55       Bladder     Atrial Fibrillation Management history:  Previous antiarrhythmic drugs: None Previous cardioversions: None Previous ablations: None Anticoagulation history: Eliquis 5 mg BID   Past Medical History:  Diagnosis Date   Abnormal renal ultrasound 04/25/2002   Diabetes mellitus without complication (HCC)    Diverticulosis of colon 08/12/2004   GERD (gastroesophageal reflux disease)    H pylori neg 08/21/02   Hypertension    Peptic ulcer disease 03/14/1980   EGD   Primary osteoarthritis of both hips 06/13/2007   Dr Turner Daniels   Pyelonephritis 01/12/2001   Vitreous detachment    Past Surgical History:  Procedure Laterality Date   BREAST REDUCTION SURGERY  03/14/93   BUNIONECTOMY  12/09   L with 2nd and 5th toe straightening   CERVICAL DISCECTOMY  03/14/06   C6-7   CHOLECYSTECTOMY  01/12/89   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   SALPINGOOPHORECTOMY  09/11/97   ruptured cyst   TONSILLECTOMY  03/14/52   TOTAL ABDOMINAL HYSTERECTOMY  01/12/89   L  salpingoophorectomy for ruptured cyst   TUBAL LIGATION  03/14/72   UMBILICAL HERNIA REPAIR  03/14/64    Current Outpatient Medications  Medication Sig Dispense Refill   amLODipine (NORVASC) 10 MG tablet Take 1 tablet (10 mg total) by mouth daily. 90 tablet 3   glipiZIDE (GLUCOTROL XL) 2.5 MG 24 hr tablet Take 1 tablet (2.5 mg total) by mouth daily with breakfast. 90 tablet 3   lisinopril (ZESTRIL) 40 MG tablet Take 1 tablet (40 mg total) by mouth daily. 90 tablet 3   metFORMIN (GLUCOPHAGE) 1000 MG tablet Take 1 tablet (1,000 mg total) by  mouth 2 (two) times daily with a meal. 200 tablet 2   Multiple Vitamins-Minerals (MULTIVITAMIN GUMMIES ADULT PO) Take by mouth.     rosuvastatin (CRESTOR) 5 MG tablet Take 1 tablet (5 mg total) by mouth every Monday, Wednesday, and Friday. 36 tablet 3   VITAMIN D PO Take by mouth.     apixaban (ELIQUIS) 5 MG TABS tablet Take 1 tablet (5 mg total) by mouth 2 (two) times daily. 60 tablet 3   Bempedoic Acid 180 MG TABS Take 1 tablet (180 mg total) by mouth daily. (Patient not taking: Reported on 08/09/2022) 30 tablet 6   diltiazem (CARDIZEM) 30 MG tablet Take 1 tablet (30 mg total) by mouth 3 (three) times daily as needed (for heart rate >110). 60 tablet 2   ezetimibe (ZETIA) 10 MG tablet Take 1 tablet (10 mg total) by mouth every other day. (Patient not taking: Reported on 08/09/2022) 15 tablet 3   No current facility-administered medications for this encounter.    Allergies  Allergen Reactions   Statins Other (See Comments)    Simvastatin: Hair loss and leg pain Atorvastatin: Hair Loss Pravasatin: Myalgia Rosuvastatin: High doses lead to myalgia/joint pain    Social History   Socioeconomic History   Marital status: Married    Spouse name: John   Number of children: 2   Years of education: 12   Highest education level: 12th grade  Occupational History   Occupation: Retired-Sales of store fixtures  Tobacco Use   Smoking status: Never    Passive exposure: Never   Smokeless tobacco: Never  Vaping Use   Vaping Use: Never used  Substance and Sexual Activity   Alcohol use: No   Drug use: No   Sexual activity: Not Currently  Other Topics Concern   Not on file  Social History Narrative   Retired- Nurse, learning disability with 2 levels, 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 Smoke detectors, no tripping hazards.Has grab bars in bathroom.   2 daughters in the area; [place] and [place]. One grandson locally.    Emergency Contact: husband, Jonny Ruiz [phone]   End of Life Plan: pt has advance directives  but does not have in chart, requested copy from family.   Who lives with you: husband    Any pets: dog-Jack   Diet: Pt has a varied diet of protein, starch and vegetables.   Exercise: Patient reports she is very active throughout the day. Walking the dog, running errands and house chores.    Seatbelts: Pt reports wearing seatbelt when in vehicles.    Wynelle Link Exposure/Protection: Pt reports using sunscreen regularly.    Hobbies: church, walking, yard work, cooking         Social Determinants of Corporate investment banker Strain: Low Risk  (02/09/2022)   Overall Financial Resource Strain (CARDIA)    Difficulty of Paying Living Expenses: Not hard at all  Food Insecurity: No Food Insecurity (02/09/2022)   Hunger Vital Sign    Worried About Running Out of Food in the Last Year: Never true    Ran Out of Food in the Last Year: Never true  Transportation Needs: No Transportation Needs (02/09/2022)   PRAPARE - Administrator, Civil Service (Medical): No    Lack of Transportation (Non-Medical): No  Physical Activity: Insufficiently Active (02/09/2022)   Exercise Vital Sign    Days of Exercise per Week: 3 days    Minutes of Exercise per Session: 30 min  Stress: No Stress Concern Present (02/09/2022)   [employer] of Occupational Health - Occupational Stress Questionnaire    Feeling of Stress : Not at all  Social Connections: Socially Integrated (02/09/2022)   Social Connection and Isolation Panel [NHANES]    Frequency of Communication with Friends and Family: More than three times a week    Frequency of Social Gatherings with Friends and Family: More than three times a week    Attends Religious Services: More than 4 times per year    Active Member of [employer] or Organizations: Yes    Attends Engineer, structural: More than 4 times per year    Marital Status: Married  Catering manager Violence: Not At Risk (02/09/2022)   Humiliation, Afraid, Rape, and Kick  questionnaire    Fear of Current or Ex-Partner: No    Emotionally Abused: No    Physically Abused: No    Sexually Abused: No    ROS- All systems are reviewed and negative except as per the HPI above.  Physical Exam: Vitals:   08/09/22 1357  BP: (!) 152/62  Pulse: 90  Weight: 89.9 kg  Height: 5\' 8"  (1.727 m)    GEN- The patient is a well appearing female, alert and oriented x 3 today.   Head- normocephalic, atraumatic Eyes-  Sclera clear, conjunctiva pink Ears- hearing intact Oropharynx- clear Neck- supple  Lungs- Clear to ausculation bilaterally, normal work of breathing Heart- Regular rate and rhythm, no murmurs, rubs or gallops  GI- soft, NT, ND, + BS Extremities- no clubbing, cyanosis, or edema MS- no significant deformity or atrophy Skin- no rash or lesion Psych- euthymic mood, full affect Neuro- strength and sensation are intact  Wt Readings from Last 3 Encounters:  08/09/22 89.9 kg  08/03/22 90.7 kg  08/02/22 90.7 kg    EKG today demonstrates  Vent. rate 90 BPM PR interval 134 ms QRS duration 82 ms QT/QTcB 326/398 ms P-R-T axes 75 63 77 Normal sinus rhythm Normal ECG When compared with ECG of 01-Aug-2022 01:43, PREVIOUS ECG IS PRESENT  Echo: N/A  Epic records are reviewed at length today.  CHA2DS2-VASc Score = 6  The patient's score is based upon: CHF History: 0 HTN History: 1 Diabetes History: 1 Stroke History: 0 Vascular Disease History: 1 Age Score: 2 Gender Score: 1       ASSESSMENT AND PLAN: Paroxysmal Atrial Fibrillation (ICD10:  I48.0) The patient's CHA2DS2-VASc score is 6, indicating a 9.7% annual risk of stroke.    Education provided about Afib with visual diagram. Discussion about medication treatments and ablation in detail. After discussion, we will proceed with conservative observation at this time. It appears she has had possibly 2 episodes of Afib, related to acute periods of overexertion.  We will schedule baseline  echocardiogram. Continue diltiazem prn. F/u 1 month to reassess burden.   Secondary Hypercoagulable State (ICD10:  D68.69) The patient is at significant risk  for stroke/thromboembolism based upon her CHA2DS2-VASc Score of 6.  Continue Apixaban (Eliquis).  Continue without interruption. CBC in 1 month.  3. Obesity Body mass index is 30.14 kg/m. Lifestyle modification was discussed at length including regular exercise and weight reduction. Encouraged daily walking as tolerated.  4. Snoring with concern for Obstructive sleep apnea Sleep study ordered.  5. HTN Elevated today, monitor at home for trend.   Follow up in 1 month Afib clinic.    Lake Bells, PA-C Afib Clinic [facility] [address] [place], [place] [address] [phone] 08/09/2022 2:59 PM

## 2022-08-10 ENCOUNTER — Ambulatory Visit: Payer: Medicare Other | Admitting: Physician Assistant

## 2022-08-11 ENCOUNTER — Telehealth: Payer: Self-pay

## 2022-08-11 DIAGNOSIS — R531 Weakness: Secondary | ICD-10-CM | POA: Diagnosis not present

## 2022-08-11 DIAGNOSIS — I10 Essential (primary) hypertension: Secondary | ICD-10-CM | POA: Diagnosis not present

## 2022-08-11 DIAGNOSIS — G44209 Tension-type headache, unspecified, not intractable: Secondary | ICD-10-CM | POA: Diagnosis not present

## 2022-08-11 DIAGNOSIS — Z7901 Long term (current) use of anticoagulants: Secondary | ICD-10-CM | POA: Diagnosis not present

## 2022-08-11 DIAGNOSIS — Z743 Need for continuous supervision: Secondary | ICD-10-CM | POA: Diagnosis not present

## 2022-08-11 DIAGNOSIS — E871 Hypo-osmolality and hyponatremia: Secondary | ICD-10-CM | POA: Diagnosis not present

## 2022-08-11 DIAGNOSIS — R6889 Other general symptoms and signs: Secondary | ICD-10-CM | POA: Diagnosis not present

## 2022-08-11 DIAGNOSIS — S161XXA Strain of muscle, fascia and tendon at neck level, initial encounter: Secondary | ICD-10-CM | POA: Diagnosis not present

## 2022-08-11 DIAGNOSIS — M6281 Muscle weakness (generalized): Secondary | ICD-10-CM | POA: Diagnosis not present

## 2022-08-11 NOTE — Telephone Encounter (Signed)
Transition Care Management Follow-up Telephone Call Date of discharge and from where: 08/01/2022 [facility] How have you been since you were released from the hospital? Patient is feeling better but her blood pressure was higher than usual this morning. Any questions or concerns? No  Items Reviewed: Did the pt receive and understand the discharge instructions provided? Yes  Medications obtained and verified? Yes  Other? No  Any new allergies since your discharge? No  Dietary orders reviewed? Yes Do you have support at home? Yes   Follow up appointments reviewed:  PCP Hospital f/u appt confirmed? No  Scheduled to see  on  @ . Specialist Hospital f/u appt confirmed? Yes  Scheduled to see Lake Bells, PA-C on 08/09/2022 @ [place] [facility] at [facility]. Are transportation arrangements needed? No  If their condition worsens, is the pt aware to call PCP or go to the Emergency Dept.? Yes Was the patient provided with contact information for the PCP's office or ED? Yes Was to pt encouraged to call back with questions or concerns? Yes  Marsel Gail [facility]  [facility] Population Health Community Resource Care Guide   [email or web address]  ?? [phone]   Website: [email or web address]  [email or web address]

## 2022-08-12 ENCOUNTER — Telehealth: Payer: Self-pay

## 2022-08-12 DIAGNOSIS — I10 Essential (primary) hypertension: Secondary | ICD-10-CM | POA: Diagnosis not present

## 2022-08-12 MED ORDER — CARVEDILOL 6.25 MG PO TABS
6.2500 mg | ORAL_TABLET | Freq: Two times a day (BID) | ORAL | 0 refills | Status: DC
Start: 2022-08-12 — End: 2022-08-16

## 2022-08-12 NOTE — Telephone Encounter (Signed)
I was able to locate the ED visit from [facility].  She is already on max dose amlodipine and lisinopril. I hesitate to add HCTZ as she is already hyponatremic. We can add a low dose beta blocker to see if it helps with hypertension control.   Please inform patient: - I sent in a new blood pressure medication for her to [facility] - carvedilol 6.25mg  twice daily  - She should not take the diltiazem while on the carvedilol (this was previously prescribed as needed if she goes into afib) - She can take an OTC magnesium oxide 400mg  twice daily to help her magnesium levels. - She will need to be seen in office as scheduled for repeat labs (BMET, Mag) and blood pressure check.  Tiffany Dodrill, MD

## 2022-08-12 NOTE — Telephone Encounter (Signed)
Called patient. Advised of PCP message.   All questions answered. Patient will keep follow up appointment on 08/16/22.  Veronda Prude, RN

## 2022-08-12 NOTE — Telephone Encounter (Signed)
Patient calls nurse line requesting adjustments to her BP medications.   She reports that she was seen in [facility] ED last night regarding elevated BP, muscle cramps and low magnesium levels.   I am unable to find ED report in care everywhere. Patient does report that she received Mg supplementation in the ED last night, however did not receive any supplementation to continue at home.   Advised patient that she would need an appointment for follow up and to discuss any medication changes.   She is unable to schedule today, scheduled patient for Tuesday afternoon.   Most recent BP is 171/80, HR: 81. She is currently asymptomatic.   She is asking if she should be taking OTC Mg supplement. Will forward to PCP for further advisement.   ED precautions discussed.   Veronda Prude, RN

## 2022-08-16 ENCOUNTER — Ambulatory Visit (INDEPENDENT_AMBULATORY_CARE_PROVIDER_SITE_OTHER): Payer: Medicare Other | Admitting: Student

## 2022-08-16 ENCOUNTER — Ambulatory Visit: Payer: Medicare Other | Admitting: Family Medicine

## 2022-08-16 VITALS — BP 124/60 | HR 55 | Ht 68.0 in | Wt 196.0 lb

## 2022-08-16 DIAGNOSIS — I480 Paroxysmal atrial fibrillation: Secondary | ICD-10-CM | POA: Diagnosis not present

## 2022-08-16 DIAGNOSIS — I10 Essential (primary) hypertension: Secondary | ICD-10-CM

## 2022-08-16 MED ORDER — CARVEDILOL 3.125 MG PO TABS
3.1250 mg | ORAL_TABLET | Freq: Two times a day (BID) | ORAL | 3 refills | Status: DC
Start: 2022-08-16 — End: 2023-08-10

## 2022-08-16 NOTE — Assessment & Plan Note (Addendum)
Recent addition of Coreg 6.25 mg twice daily.  Suspect that the symptoms she is now having are due to excessive AV nodal blockade given that she was unable to mount a heart rate greater than 63 bpm despite a brisk walk around clinic. -Decrease Coreg to 3.125 mg twice daily -Continue Lisinopril 40mg  daily and amlodipine 10mg  daily  -She will continue to monitor heart rate and blood pressure at home, if these remain persistently low, she will discontinue the Coreg altogether and call our office

## 2022-08-16 NOTE — Patient Instructions (Signed)
Ms. Tiffany Becker, Tiffany Becker to meet you! I think the fatigued feeling you're having is from your Coreg being at too high a dose. Let's cut back to 3.125mg  twice daily. Keep an eye on your BP and your HR. If they both stay low and you continue to feel fatigued with activity, just stop it altogether and give [place] a call or bring it up with your cardiologist if that appointment is coming up soon.  Be well,  Eliezer Mccoy, MD

## 2022-08-16 NOTE — Assessment & Plan Note (Signed)
CHA2DS2-VASc score of 6, appropriately started on Eliquis. -Continue Eliquis, Coreg as above, as needed diltiazem -Cardiology follow-up on June 18 -Has an upcoming sleep study as well

## 2022-08-16 NOTE — Progress Notes (Signed)
    SUBJECTIVE:   CHIEF COMPLAINT / HPI:   Hypertension  A-Fib Follow-up Previously taking amlodipine 10mg  and lisinopril 40mg  daily for BP control.   5/20- New onset Afib with rates to 200s at home, presented to ED. Converted with Cardizem and fluids. Started on Eliquis and TID PRN Cardizem.  5/28- Afib clinic, no changes, though BP elevated to 152/62 5/30- [facility] ED for elevated BP (169/80), got Toradol for neck pain and Valium 5/31- Dr. Pollie Meyer started 6.25mg  Coreg BID over the phone    Here today because since the initiation of Coreg, finds that she fatigues easily with activity. Also notes that her Bps have been lower (monitor reviewed consistently 120-130s/50s-60s).  Her BP monitor also tracks her heart rate, reviewed and she is consistently in the 50s and 60s.  Denies any lightheadedness/dizziness/presyncope but does endorse "just feeling funny in the head."    Has follow-up cards appt June 18th.    OBJECTIVE:   BP 124/60   Pulse (!) 55   Ht 5\' 8"  (1.727 m)   Wt 196 lb (88.9 kg)   SpO2 98%   BMI 29.80 kg/m   Patient ambulated in the clinic with pulse ox, full lap of clinic at a relatively brisk pace and her heart rate only elevated to 63 bpm.  Gen: Well-appearing, NAD Cardio: Regular, bradycardic Pulm: Normal WOB on RA and with exertion as above Neuro: Normal Gait   ASSESSMENT/PLAN:   Essential hypertension, benign Recent addition of Coreg 6.25 mg twice daily.  Suspect that the symptoms she is now having are due to excessive AV nodal blockade given that she was unable to mount a heart rate greater than 63 bpm despite a brisk walk around clinic. -Decrease Coreg to 3.125 mg twice daily -Continue Lisinopril 40mg  daily and amlodipine 10mg  daily  -She will continue to monitor heart rate and blood pressure at home, if these remain persistently low, she will discontinue the Coreg altogether and call our office  Paroxysmal atrial fibrillation (HCC) CHA2DS2-VASc score of 6,  appropriately started on Eliquis. -Continue Eliquis, Coreg as above, as needed diltiazem -Cardiology follow-up on June 18 -Has an upcoming sleep study as well     J Dorothyann Gibbs, MD [facility]

## 2022-08-17 ENCOUNTER — Ambulatory Visit (HOSPITAL_BASED_OUTPATIENT_CLINIC_OR_DEPARTMENT_OTHER): Payer: Medicare Other | Attending: Internal Medicine | Admitting: Cardiovascular Disease

## 2022-08-17 VITALS — Ht 69.0 in | Wt 195.0 lb

## 2022-08-17 DIAGNOSIS — I480 Paroxysmal atrial fibrillation: Secondary | ICD-10-CM

## 2022-08-17 DIAGNOSIS — H26491 Other secondary cataract, right eye: Secondary | ICD-10-CM | POA: Diagnosis not present

## 2022-08-17 DIAGNOSIS — G4733 Obstructive sleep apnea (adult) (pediatric): Secondary | ICD-10-CM | POA: Diagnosis not present

## 2022-08-19 ENCOUNTER — Encounter (HOSPITAL_BASED_OUTPATIENT_CLINIC_OR_DEPARTMENT_OTHER): Payer: Self-pay | Admitting: Cardiovascular Disease

## 2022-08-19 DIAGNOSIS — M1712 Unilateral primary osteoarthritis, left knee: Secondary | ICD-10-CM | POA: Diagnosis not present

## 2022-08-19 NOTE — Procedures (Signed)
     Patient Name: Tiffany Becker, Tiffany Becker Date: 08/17/2022 Gender: Female D.O.B: 1945-08-13 Age (years): 35 Referring Provider: Eustace Pen PA-C Height (inches): 68 Interpreting Physician: Nicki Guadalajara MD, ABSM Weight (lbs): 198 RPSGT: [place] BMI: 30 MRN: [id number] Neck Size: <br>  CLINICAL INFORMATION Sleep Study Type: HST  Indication for sleep study: Hypertension, PAF, snoring  Epworth Sleepiness Score: N/A  SLEEP STUDY TECHNIQUE A multi-channel overnight portable sleep study was performed. The channels recorded were: nasal airflow, thoracic respiratory movement, and oxygen saturation with a pulse oximetry. Snoring was also monitored.  MEDICATIONS amLODipine (NORVASC) 10 MG tablet apixaban (ELIQUIS) 5 MG TABS tablet Bempedoic Acid 180 MG TABS carvedilol (COREG) 3.125 MG tablet diltiazem (CARDIZEM) 30 MG tablet ezetimibe (ZETIA) 10 MG tablet glipiZIDE (GLUCOTROL XL) 2.5 MG 24 hr tablet lisinopril (ZESTRIL) 40 MG tablet metFORMIN (GLUCOPHAGE) 1000 MG tablet Multiple Vitamins-Minerals (MULTIVITAMIN GUMMIES ADULT PO) rosuvastatin (CRESTOR) 5 MG tablet VITAMIN D PO Patient self administered medications include: N/A.  SLEEP ARCHITECTURE Patient was studied for 300.5 minutes. The sleep efficiency was 100.0 % and the patient was supine for 0%. The arousal index was 0.0 per hour.  RESPIRATORY PARAMETERS The overall AHI was 27.8 per hour, with a central apnea index of 0 per hour.  The oxygen nadir was 81% during sleep.  CARDIAC DATA Mean heart rate during sleep was 56.9 bpm. The highest pulse was 81 bpm.  IMPRESSIONS - Moderate obstructive sleep apnea occurred during this study (AHI 27.8/h). - Moderate oxygen desaturation to a nadir of 81%. Time spent < 89% was 3.7 minutes. - Patient snored 1.2% during the sleep.  DIAGNOSIS - Obstructive Sleep Apnea (G47.33)  RECOMMENDATIONS - In this patient with cardiovascular comorbidities recommend  therapeutic CPAP for treatment of her moderate sleep disordered breathing. Can initiate a trial of Auto-PAP with EPR of 3 at 7 - 16 cm of water. - Effort should be made to optimize nasal and oropharyngeal patency. - Positional therapy avoiding supine position during sleep. - Avoid alcohol, sedatives and other CNS depressants that may worsen sleep apnea and disrupt normal sleep architecture. - Sleep hygiene should be reviewed to assess factors that may improve sleep quality. - Weight management (BMI 30) and regular exercise should be initiated or continued. - Recommend a download and sleep clinic evaluation after 4 - 6 weeks of therapy.    [Electronically signed] 08/19/2022 06:12 PM  Nicki Guadalajara MD, [facility], ABSM Diplomate, American Board of Sleep Medicine  NPI: [id number]   SLEEP DISORDERS [facility]ENTER PH: [phone]   FX: [phone] ACCREDITED BY THE AMERICAN ACADEMY OF SLEEP MEDICINE

## 2022-08-22 ENCOUNTER — Telehealth: Payer: Self-pay

## 2022-08-22 NOTE — Telephone Encounter (Signed)
-----   Message from Lennette Bihari, MD sent at 08/19/2022  6:16 PM EDT ----- Eliseo Gum, please notify pt of results and set up with DME for Auto-PAP as prescribed.

## 2022-08-22 NOTE — Telephone Encounter (Signed)
Patient notified of sleep study results and recommendations. All questions(if any) were answered. Patient verbalized understanding. CPAP Machine and supplies ordered through [facility] 08/22/22.

## 2022-08-25 ENCOUNTER — Ambulatory Visit (HOSPITAL_COMMUNITY)
Admission: RE | Admit: 2022-08-25 | Discharge: 2022-08-25 | Disposition: A | Discharge status: Home / Self Care | Payer: Medicare Other | Source: Ambulatory Visit | Attending: Internal Medicine | Admitting: Internal Medicine

## 2022-08-25 DIAGNOSIS — I480 Paroxysmal atrial fibrillation: Secondary | ICD-10-CM | POA: Diagnosis not present

## 2022-08-25 LAB — ECHOCARDIOGRAM COMPLETE
AR max vel: 2.09 cm2
AV Area VTI: 2.39 cm2
AV Area mean vel: 2.23 cm2
AV Mean grad: 4 mmHg
AV Peak grad: 8.3 mmHg
Ao pk vel: 1.44 m/s
Area-P 1/2: 3.21 cm2
S' Lateral: 2.7 cm

## 2022-08-25 MED ORDER — PERFLUTREN LIPID MICROSPHERE
1.0000 mL | INTRAVENOUS | Status: AC | PRN
  Administered 2022-08-25: 2 mL via INTRAVENOUS

## 2022-08-26 ENCOUNTER — Other Ambulatory Visit: Payer: Self-pay | Admitting: Family Medicine

## 2022-08-26 DIAGNOSIS — M1712 Unilateral primary osteoarthritis, left knee: Secondary | ICD-10-CM | POA: Diagnosis not present

## 2022-08-30 ENCOUNTER — Encounter (HOSPITAL_BASED_OUTPATIENT_CLINIC_OR_DEPARTMENT_OTHER): Payer: Self-pay | Admitting: Internal Medicine

## 2022-08-30 ENCOUNTER — Ambulatory Visit (HOSPITAL_BASED_OUTPATIENT_CLINIC_OR_DEPARTMENT_OTHER): Payer: Medicare Other | Admitting: Internal Medicine

## 2022-08-30 VITALS — BP 128/73 | HR 65 | Ht 69.0 in | Wt 197.9 lb

## 2022-08-30 DIAGNOSIS — T466X5A Adverse effect of antihyperlipidemic and antiarteriosclerotic drugs, initial encounter: Secondary | ICD-10-CM

## 2022-08-30 DIAGNOSIS — E785 Hyperlipidemia, unspecified: Secondary | ICD-10-CM | POA: Diagnosis not present

## 2022-08-30 DIAGNOSIS — I480 Paroxysmal atrial fibrillation: Secondary | ICD-10-CM

## 2022-08-30 DIAGNOSIS — I2510 Atherosclerotic heart disease of native coronary artery without angina pectoris: Secondary | ICD-10-CM

## 2022-08-30 DIAGNOSIS — M791 Myalgia, unspecified site: Secondary | ICD-10-CM | POA: Diagnosis not present

## 2022-08-30 DIAGNOSIS — T466X5D Adverse effect of antihyperlipidemic and antiarteriosclerotic drugs, subsequent encounter: Secondary | ICD-10-CM

## 2022-08-30 MED ORDER — ROSUVASTATIN CALCIUM 5 MG PO TABS
5.0000 mg | ORAL_TABLET | Freq: Every day | ORAL | 3 refills | Status: AC
Start: 2022-08-30 — End: ?

## 2022-08-30 NOTE — Progress Notes (Signed)
LIPID CLINIC CONSULT NOTE  Chief Complaint:  Manage dyslipidemia  Primary Care Physician: Latrelle Dodrill, MD  Primary Cardiologist:  None  HPI:  Tiffany Becker is a 77 y.o. female who is being seen today for the evaluation of dyslipidemia at the request of Pollie Meyer Estevan Ryder, MD.  This is a pleasant 77 year old female kindly referred for evaluation of dyslipidemia.  She has a history of high cholesterol with intolerance to simvastatin and more recently a atorvastatin due to to causing symptoms of leg pain and hair loss.  She was also noted to have recent vitamin D deficiency with a level of about 24 on repletion.  She has a family history of heart disease in her father who had an MI at 15.  Recent lipids in December showed total cholesterol of 197, triglycerides 106, HDL 63 and LDL 115.  She was previously taking atorvastatin 40 mg every Monday Wednesday and Friday and decrease the dose to 20 mg 3 times a week but still had side effects.  She feels that she eats a fairly healthy diet but has been actively trying to reduce saturated fats.  10/28/2020  Tiffany Becker returns today for follow-up.  She has had significant improvement in her lipids.  Total cholesterol now 136, HDL 58, triglycerides 74 and LDL 63.  This apparently is on predominantly 5 mg of rosuvastatin.  She was also approved for Repatha however was concerned about side effects of the medication.  Although she did get the prescription filled and qualified for a health well grant for which she got $2500 to use for the year, at this point he does not appear that she needs the medication.  She is also made some dietary changes.  She has reached target LDL less than 70.  05/14/2021  Tiffany Becker is seen today for follow-up.  Overall she seems to be doing well.  She denies any chest pain or worsening shortness of breath.  She is tolerating low-dose rosuvastatin and has some infrequent leg discomfort.  That being said, her lipids  remain low with LDL now at 66, total cholesterol 148, HDL 68 and Trigs 71.  08/30/2022  Tiffany Becker returns today for follow-up.  Her cholesterol has trended up somewhat.  Recent lipids in April showed total 163, HDL 64, triglycerides 112 and LDL 79.  After this it was noted that she was started on bempedoic acid by her primary care provider.  She says she has been taking this medicine for more than a month but recently has been having significant myalgias which she says she attributes to the medication.  She was considering stopping it but wanted to discuss it with me.  On top of that, she recently was diagnosed with atrial fibrillation.  She says she has had intermittent palpitations for a long time but eventually was finally noted to have PAF and has been anticoagulated on Eliquis.  She said at home her heart rate got up to around 200 and she presented to the ER by EMS with heart rate control on diltiazem.  She also has some breakthrough diltiazem as needed and carvedilol for rate control.  She has noted with the carvedilol and amlodipine that her blood pressure sometimes gets low and therefore she breaks her amlodipine in half and takes 5 mg twice a day.  On this regimen she has done well.  PMHx:  Past Medical History:  Diagnosis Date   Abnormal renal ultrasound 04/25/2002   Diabetes mellitus without complication (HCC)  Diverticulosis of colon 08/12/2004   GERD (gastroesophageal reflux disease)    H pylori neg 08/21/02   Hypertension    Peptic ulcer disease 03/14/1980   EGD   Primary osteoarthritis of both hips 06/13/2007   Dr Turner Daniels   Pyelonephritis 01/12/2001   Vitreous detachment     Past Surgical History:  Procedure Laterality Date   BREAST REDUCTION SURGERY  03/14/93   BUNIONECTOMY  12/09   L with 2nd and 5th toe straightening   CERVICAL DISCECTOMY  03/14/06   C6-7   CHOLECYSTECTOMY  01/12/89   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   SALPINGOOPHORECTOMY  09/11/97   ruptured cyst    TONSILLECTOMY  03/14/52   TOTAL ABDOMINAL HYSTERECTOMY  01/12/89   L salpingoophorectomy for ruptured cyst   TUBAL LIGATION  03/14/72   UMBILICAL HERNIA REPAIR  03/14/64    FAMHx:  Family History  Problem Relation Age of Onset   COPD Mother    Osteoarthritis Mother    Asthma Mother    Heart disease Mother    Coronary artery disease Father 48       died age 6   Heart disease Father    Stroke Father    Diabetes Brother        bladder, pacemaker, Hemochromatosis   Cancer Brother 52       Bladder    SOCHx:   reports that she has never smoked. She has never been exposed to tobacco smoke. She has never used smokeless tobacco. She reports that she does not drink alcohol and does not use drugs.  ALLERGIES:  Allergies  Allergen Reactions   Statins Other (See Comments)    Simvastatin: Hair loss and leg pain Atorvastatin: Hair Loss Pravasatin: Myalgia Rosuvastatin: High doses lead to myalgia/joint pain    ROS: Pertinent items noted in HPI and remainder of comprehensive ROS otherwise negative.  HOME MEDS: Current Outpatient Medications on File Prior to Visit  Medication Sig Dispense Refill   amLODipine (NORVASC) 10 MG tablet Take 1 tablet (10 mg total) by mouth daily. 90 tablet 3   apixaban (ELIQUIS) 5 MG TABS tablet Take 1 tablet (5 mg total) by mouth 2 (two) times daily. 60 tablet 3   Bempedoic Acid 180 MG TABS Take 1 tablet (180 mg total) by mouth daily. 30 tablet 6   carvedilol (COREG) 3.125 MG tablet Take 1 tablet (3.125 mg total) by mouth 2 (two) times daily with a meal. 180 tablet 3   diltiazem (CARDIZEM) 30 MG tablet Take 1 tablet (30 mg total) by mouth 3 (three) times daily as needed (for heart rate >110). 60 tablet 2   glipiZIDE (GLUCOTROL XL) 2.5 MG 24 hr tablet Take 1 tablet (2.5 mg total) by mouth daily with breakfast. 90 tablet 3   lisinopril (ZESTRIL) 40 MG tablet Take 1 tablet (40 mg total) by mouth daily. 90 tablet 3   metFORMIN (GLUCOPHAGE) 1000 MG tablet Take 1  tablet (1,000 mg total) by mouth 2 (two) times daily with a meal. 200 tablet 2   Multiple Vitamins-Minerals (MULTIVITAMIN GUMMIES ADULT PO) Take by mouth.     rosuvastatin (CRESTOR) 5 MG tablet Take 1 tablet (5 mg total) by mouth every Monday, Wednesday, and Friday. 36 tablet 3   VITAMIN D PO Take by mouth.     ezetimibe (ZETIA) 10 MG tablet Take 1 tablet (10 mg total) by mouth every other day. (Patient not taking: Reported on 08/09/2022) 15 tablet 3   No current facility-administered medications on  file prior to visit.    LABS/IMAGING: No results found for this or any previous visit (from the past 48 hour(s)). No results found.  LIPID PANEL:    Component Value Date/Time   CHOL 163 07/04/2022 1051   TRIG 112 07/04/2022 1051   HDL 64 07/04/2022 1051   CHOLHDL 2.5 07/04/2022 1051   CHOLHDL 3.0 10/20/2014 0941   VLDL 25 10/20/2014 0941   LDLCALC 79 07/04/2022 1051   LDLDIRECT 47 03/29/2021 1145    WEIGHTS: Wt Readings from Last 3 Encounters:  08/30/22 197 lb 14.4 oz (89.8 kg)  08/17/22 195 lb (88.5 kg)  08/16/22 196 lb (88.9 kg)    VITALS: BP 128/73 (BP Location: Left Arm, Patient Position: Sitting, Cuff Size: Normal)   Pulse 65   Ht 5\' 9"  (1.753 m)   Wt 197 lb 14.4 oz (89.8 kg)   BMI 29.22 kg/m   EXAM: Deferred  EKG: Deferred  ASSESSMENT: Mixed dyslipidemia, goal LDL less than 70 Elevated CAC score of 724, 93rd percentile, aortic atherosclerosis DM2 Hypertension Family history of premature CAD in father Statin intolerance -myalgias PAF on Eliquis  PLAN: 1.   Tiffany Becker has had climbing cholesterol recently and was started on Nexletol but does not seem to be tolerating it well.  I advise stopping it to see if her symptoms go away.  If so then we could consider increasing her rosuvastatin to daily dosing.  This may give her enough additional benefit to get her LDL less than 70.  She says that her symptom control with the A-fib has been very good.  She has not  really required short acting diltiazem.  Once she started breaking up her amlodipine to 5 mg twice a day blood pressure control has been good without significant hypotensive episodes.  Plan repeat lipids in about 3 to 4 months.  Chrystie Nose, MD, [facility], FACP  [place]  [facility]  Medical Director of the Advanced Lipid Disorders &  Cardiovascular Risk Reduction Clinic Diplomate of the American Board of Clinical Lipidology Attending Cardiologist  Direct Dial: [phone]  Fax: [phone]  Website:  [email or web address] Nicely Maico Mulvehill 08/30/2022, 9:42 AM

## 2022-08-30 NOTE — Patient Instructions (Signed)
Medication Instructions:  STOP Nexletol   Please let us know how you are doing off this medication after a few weeks. If doing OK, please increase Crestor to every day.    *If you need a refill on your cardiac medications before your next appointment, please call your pharmacy*   Lab Work: FASTING lab work to check cholesterol in about 6 months  If you have labs (blood work) drawn today and your tests are completely normal, you will receive your results only by: MyChart Message (if you have MyChart) OR A paper copy in the mail If you have any lab test that is abnormal or we need to change your treatment, we will call you to review the results.   Follow-Up: At [facility], you and your health needs are our priority.  As part of our continuing mission to provide you with exceptional heart care, we have created designated Provider Care Teams.  These Care Teams include your primary Cardiologist (physician) and Advanced Practice Providers (APPs -  Physician Assistants and Nurse Practitioners) who all work together to provide you with the care you need, when you need it.  We recommend signing up for the patient portal called "MyChart".  Sign up information is provided on this After Visit Summary.  MyChart is used to connect with patients for Virtual Visits (Telemedicine).  Patients are able to view lab/test results, encounter notes, upcoming appointments, etc.  Non-urgent messages can be sent to your provider as well.   To learn more about what you can do with MyChart, go to [email or web address].    Your next appointment:    6 months with Dr. Rennis Golden or Marcelino Duster NP

## 2022-08-31 ENCOUNTER — Telehealth: Payer: Self-pay

## 2022-08-31 NOTE — Telephone Encounter (Signed)
[facility] mail order pharmacy indicates pt has an allergy to statins and has been prescribed rosuvastatin 5 mg tablets. Pharmacy wants to know if Dr. Rennis Golden is aware of the allergy/potential cross-sensitivity? Please address     Ph# [phone]  Order#  [id number]

## 2022-09-01 NOTE — Telephone Encounter (Signed)
Spoke with Neldon Labella the pharmacist and she stated she will take hold off of rosuvastatin and send out to patient.

## 2022-09-02 DIAGNOSIS — M1712 Unilateral primary osteoarthritis, left knee: Secondary | ICD-10-CM | POA: Diagnosis not present

## 2022-09-07 ENCOUNTER — Ambulatory Visit (HOSPITAL_COMMUNITY)
Admission: RE | Admit: 2022-09-07 | Discharge: 2022-09-07 | Disposition: A | Discharge status: Home / Self Care | Payer: Medicare Other | Source: Ambulatory Visit | Attending: Internal Medicine | Admitting: Internal Medicine

## 2022-09-07 ENCOUNTER — Inpatient Hospital Stay (HOSPITAL_COMMUNITY)
Admission: RE | Admit: 2022-09-07 | Discharge: 2022-09-07 | Disposition: A | Discharge status: Home / Self Care | Payer: Medicare Other | Source: Ambulatory Visit | Attending: Internal Medicine | Admitting: Internal Medicine

## 2022-09-07 VITALS — BP 142/64 | HR 86 | Ht 69.0 in | Wt 199.6 lb

## 2022-09-07 DIAGNOSIS — Z7901 Long term (current) use of anticoagulants: Secondary | ICD-10-CM | POA: Diagnosis not present

## 2022-09-07 DIAGNOSIS — R0683 Snoring: Secondary | ICD-10-CM | POA: Diagnosis not present

## 2022-09-07 DIAGNOSIS — I480 Paroxysmal atrial fibrillation: Secondary | ICD-10-CM | POA: Diagnosis not present

## 2022-09-07 DIAGNOSIS — I10 Essential (primary) hypertension: Secondary | ICD-10-CM | POA: Insufficient documentation

## 2022-09-07 DIAGNOSIS — E785 Hyperlipidemia, unspecified: Secondary | ICD-10-CM | POA: Insufficient documentation

## 2022-09-07 DIAGNOSIS — Z6829 Body mass index (BMI) 29.0-29.9, adult: Secondary | ICD-10-CM | POA: Insufficient documentation

## 2022-09-07 DIAGNOSIS — E669 Obesity, unspecified: Secondary | ICD-10-CM | POA: Insufficient documentation

## 2022-09-07 DIAGNOSIS — D6869 Other thrombophilia: Secondary | ICD-10-CM | POA: Insufficient documentation

## 2022-09-07 DIAGNOSIS — Z8249 Family history of ischemic heart disease and other diseases of the circulatory system: Secondary | ICD-10-CM | POA: Insufficient documentation

## 2022-09-07 LAB — CBC
HCT: 40.5 % (ref 36.0–46.0)
Hemoglobin: 13.3 g/dL (ref 12.0–15.0)
MCH: 30.8 pg (ref 26.0–34.0)
MCHC: 32.8 g/dL (ref 30.0–36.0)
MCV: 93.8 fL (ref 80.0–100.0)
Platelets: 246 10*3/uL (ref 150–400)
RBC: 4.32 MIL/uL (ref 3.87–5.11)
RDW: 12.7 % (ref 11.5–15.5)
WBC: 8.3 10*3/uL (ref 4.0–10.5)
nRBC: 0 % (ref 0.0–0.2)

## 2022-09-07 NOTE — Progress Notes (Signed)
Primary Care Physician: Latrelle Dodrill, MD Primary Cardiologist: Dr. Rennis Golden for dyslipidemia Primary Electrophysiologist: None Referring Physician: [facility]   Tiffany Becker is a 77 y.o. female with a history of elevated coronary calcium score - 724 (93rd percentile), HLD, HTN, T2DM, osteoarthritis, atrial fibrillation who presents for consultation in the [facility]. The patient was initially diagnosed with atrial fibrillation on 08/01/22 after presenting to [facility] ED in Afib with RVR. She converted to NSR after cardizem and IV fluids by EMS. Given diltiazem 30 mg prn. Patient is on Eliquis 5 mg BID for a CHADS2VASC score of 6.  On evaluation today, she is currently in NSR. She notes that the day before ED visit she overexerted herself cooking for a family reunion. She notes may have had Afib 5 years ago due to similar symptoms; occurred after she helped her brother move. She has a BP cuff at home which alerts her when she possibly has an abnormal rhythm. She feels when she is in Afib due to palpitations.  She does not drink alcohol. She drinks 1-2 cups of coffee daily. She believes she does snore and at least historically felt like she stopped breathing at night.  She is compliant with anticoagulation and has not missed any doses. She has no bleeding concerns.  On follow up 09/07/22, she is currently in NSR. Since last office visit, she thinks she has had multiple episodes of Afib. Her BP cuff will alert her if there is an irregular heart beat and she has had that intermittently since last office visit. However, she only sometimes will feel the palpitations she did previously and notes it tends to happen in the afternoon. No missed doses of Eliquis.   Today, she denies symptoms of chest pain, shortness of breath, orthopnea, PND, lower extremity edema, dizziness, presyncope, syncope, snoring, daytime somnolence, bleeding, or neurologic sequela. The  patient is tolerating medications without difficulties and is otherwise without complaint today.   Atrial Fibrillation Risk Factors:  she does have symptoms or diagnosis of sleep apnea. she does not have a history of rheumatic fever. she does not have a history of alcohol use. The patient does not have a history of early familial atrial fibrillation or other arrhythmias.  she has a BMI of Body mass index is 29.48 kg/m.Marland Kitchen Filed Weights   09/07/22 1403  Weight: 90.5 kg     Family History  Problem Relation Age of Onset   COPD Mother    Osteoarthritis Mother    Asthma Mother    Heart disease Mother    Coronary artery disease Father 1       died age 71   Heart disease Father    Stroke Father    Diabetes Brother        bladder, pacemaker, Hemochromatosis   Cancer Brother 64       Bladder     Atrial Fibrillation Management history:  Previous antiarrhythmic drugs: None Previous cardioversions: None Previous ablations: None Anticoagulation history: Eliquis 5 mg BID   Past Medical History:  Diagnosis Date   Abnormal renal ultrasound 04/25/2002   Diabetes mellitus without complication (HCC)    Diverticulosis of colon 08/12/2004   GERD (gastroesophageal reflux disease)    H pylori neg 08/21/02   Hypertension    Peptic ulcer disease 03/14/1980   EGD   Primary osteoarthritis of both hips 06/13/2007   Dr Turner Daniels   Pyelonephritis 01/12/2001   Vitreous detachment  Past Surgical History:  Procedure Laterality Date   BREAST REDUCTION SURGERY  03/14/93   BUNIONECTOMY  12/09   L with 2nd and 5th toe straightening   CERVICAL DISCECTOMY  03/14/06   C6-7   CHOLECYSTECTOMY  01/12/89   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   SALPINGOOPHORECTOMY  09/11/97   ruptured cyst   TONSILLECTOMY  03/14/52   TOTAL ABDOMINAL HYSTERECTOMY  01/12/89   L salpingoophorectomy for ruptured cyst   TUBAL LIGATION  03/14/72   UMBILICAL HERNIA REPAIR  03/14/64    Current Outpatient Medications  Medication  Sig Dispense Refill   amLODipine (NORVASC) 10 MG tablet Take 5 mg by mouth in the morning and at bedtime.     apixaban (ELIQUIS) 5 MG TABS tablet Take 1 tablet (5 mg total) by mouth 2 (two) times daily. 60 tablet 3   carvedilol (COREG) 3.125 MG tablet Take 1 tablet (3.125 mg total) by mouth 2 (two) times daily with a meal. 180 tablet 3   diltiazem (CARDIZEM) 30 MG tablet Take 1 tablet (30 mg total) by mouth 3 (three) times daily as needed (for heart rate >110). 60 tablet 2   glipiZIDE (GLUCOTROL XL) 2.5 MG 24 hr tablet Take 1 tablet (2.5 mg total) by mouth daily with breakfast. 90 tablet 3   lisinopril (ZESTRIL) 40 MG tablet Take 1 tablet (40 mg total) by mouth daily. 90 tablet 3   MAGNESIUM PO Taking 1 gummy by mouth daily     metFORMIN (GLUCOPHAGE) 1000 MG tablet Take 1 tablet (1,000 mg total) by mouth 2 (two) times daily with a meal. 200 tablet 2   Multiple Vitamins-Minerals (MULTIVITAMIN GUMMIES ADULT PO) Taking 1 gummy by mouth several times a week     rosuvastatin (CRESTOR) 5 MG tablet Take 1 tablet (5 mg total) by mouth daily. 90 tablet 3   VITAMIN D PO Take by mouth.     No current facility-administered medications for this encounter.    Allergies  Allergen Reactions   Statins Other (See Comments)    Simvastatin: Hair loss and leg pain Atorvastatin: Hair Loss Pravasatin: Myalgia Rosuvastatin: High doses lead to myalgia/joint pain    ROS- All systems are reviewed and negative except as per the HPI above.  Physical Exam: Vitals:   09/07/22 1403  BP: (!) 142/64  Pulse: 86  Weight: 90.5 kg  Height: 5\' 9"  (1.753 m)    GEN- The patient is well appearing, alert and oriented x 3 today.   Head- normocephalic, atraumatic Eyes-  Sclera clear, conjunctiva pink Ears- hearing intact Lungs- Clear to ausculation bilaterally, normal work of breathing Heart- Regular rate and rhythm, no murmurs, rubs or gallops, PMI not laterally displaced Extremities- no clubbing, cyanosis, or  edema MS- no significant deformity or atrophy Skin- no rash or lesion Psych- euthymic mood, full affect Neuro- strength and sensation are intact   Wt Readings from Last 3 Encounters:  09/07/22 90.5 kg  08/30/22 89.8 kg  08/17/22 88.5 kg    EKG today demonstrates  Vent. rate 86 BPM PR interval 138 ms QRS duration 82 ms QT/QTcB 330/394 ms P-R-T axes 67 48 66 Normal sinus rhythm Normal ECG When compared with ECG of 09-Aug-2022 14:17, PREVIOUS ECG IS PRESENT  Echo 08/25/22: 1. Left ventricular ejection fraction, by estimation, is 60 to 65%. The  left ventricle has normal function. The left ventricle has no regional  wall motion abnormalities. Left ventricular diastolic parameters were  normal.   2. Right ventricular systolic function is  normal. The right ventricular  size is normal. Tricuspid regurgitation signal is inadequate for assessing  PA pressure.   3. The mitral valve is normal in structure. Trivial mitral valve  regurgitation. No evidence of mitral stenosis.   4. The aortic valve is normal in structure. Aortic valve regurgitation is  not visualized. No aortic stenosis is present.   5. The inferior vena cava is normal in size with greater than 50%  respiratory variability, suggesting right atrial pressure of 3 mmHg.    Epic records are reviewed at length today.  CHA2DS2-VASc Score = 6  The patient's score is based upon: CHF History: 0 HTN History: 1 Diabetes History: 1 Stroke History: 0 Vascular Disease History: 1 Age Score: 2 Gender Score: 1       ASSESSMENT AND PLAN: Paroxysmal Atrial Fibrillation (ICD10:  I48.0) The patient's CHA2DS2-VASc score is 6, indicating a 9.7% annual risk of stroke.    She is currently in NSR. After discussion, we will place a monitor for 1 week to determine burden. F/u 1 month to discuss monitor results. We briefly went over AAD options and ablation; patient would rather choose AAD over ablation if needed.  Secondary  Hypercoagulable State (ICD10:  D68.69) The patient is at significant risk for stroke/thromboembolism based upon her CHA2DS2-VASc Score of 6.  Continue Apixaban (Eliquis).  Continue without interruption. CBC drawn today.  3. Obesity Body mass index is 29.48 kg/m. Lifestyle modification was discussed at length including regular exercise and weight reduction. Encouraged daily walking as tolerated.  4. Snoring with concern for Obstructive sleep apnea Waiting on [employer] to call her to set up equipment.  5. HTN Stable today, continue to trend.   Follow up 1 month.   Lake Bells, PA-C Afib Clinic [facility] [address] [place], [place] [address] [phone] 09/07/2022 2:47 PM

## 2022-09-20 DIAGNOSIS — I480 Paroxysmal atrial fibrillation: Secondary | ICD-10-CM | POA: Diagnosis not present

## 2022-09-30 ENCOUNTER — Telehealth: Payer: Self-pay | Admitting: Internal Medicine

## 2022-09-30 ENCOUNTER — Other Ambulatory Visit: Payer: Self-pay

## 2022-09-30 MED ORDER — APIXABAN 5 MG PO TABS: 5.0000 mg | ORAL_TABLET | Freq: Two times a day (BID) | ORAL | 1 refills | Status: DC

## 2022-09-30 NOTE — Telephone Encounter (Signed)
Prescription refill request for Eliquis received. Indication:afib Last office visit:6/24 Scr:0.76  5/24 Age: 77 Weight:90.5  kg  Prescription refilled

## 2022-09-30 NOTE — Telephone Encounter (Signed)
*  STAT* If patient is at the pharmacy, call can be transferred to refill team.   1. Which medications need to be refilled? (please list name of each medication and dose if known)   apixaban (ELIQUIS) 5 MG TABS tablet (Expired)    2. Which pharmacy/location (including street and city if local pharmacy) is medication to be sent to?[facility] [address], [place] - [address]   3. Do they need a 30 day or 90 day supply? 90 day

## 2022-10-11 ENCOUNTER — Other Ambulatory Visit: Payer: Self-pay | Admitting: Family Medicine

## 2022-10-11 DIAGNOSIS — G4733 Obstructive sleep apnea (adult) (pediatric): Secondary | ICD-10-CM | POA: Diagnosis not present

## 2022-10-11 DIAGNOSIS — E785 Hyperlipidemia, unspecified: Secondary | ICD-10-CM

## 2022-10-12 ENCOUNTER — Encounter: Payer: Self-pay | Admitting: Family Medicine

## 2022-10-13 ENCOUNTER — Other Ambulatory Visit: Payer: Self-pay | Admitting: Family Medicine

## 2022-10-13 ENCOUNTER — Ambulatory Visit (HOSPITAL_COMMUNITY)
Admission: RE | Admit: 2022-10-13 | Discharge: 2022-10-13 | Disposition: A | Discharge status: Home / Self Care | Payer: Medicare Other | Source: Ambulatory Visit | Attending: Internal Medicine | Admitting: Internal Medicine

## 2022-10-13 VITALS — BP 122/66 | HR 61 | Ht 69.0 in | Wt 198.4 lb

## 2022-10-13 DIAGNOSIS — G4733 Obstructive sleep apnea (adult) (pediatric): Secondary | ICD-10-CM | POA: Insufficient documentation

## 2022-10-13 DIAGNOSIS — E785 Hyperlipidemia, unspecified: Secondary | ICD-10-CM

## 2022-10-13 DIAGNOSIS — E669 Obesity, unspecified: Secondary | ICD-10-CM | POA: Insufficient documentation

## 2022-10-13 DIAGNOSIS — I480 Paroxysmal atrial fibrillation: Secondary | ICD-10-CM | POA: Insufficient documentation

## 2022-10-13 DIAGNOSIS — Z6829 Body mass index (BMI) 29.0-29.9, adult: Secondary | ICD-10-CM | POA: Insufficient documentation

## 2022-10-13 DIAGNOSIS — D6869 Other thrombophilia: Secondary | ICD-10-CM | POA: Insufficient documentation

## 2022-10-13 DIAGNOSIS — I10 Essential (primary) hypertension: Secondary | ICD-10-CM | POA: Diagnosis not present

## 2022-10-13 NOTE — Progress Notes (Signed)
Primary Care Physician: Latrelle Dodrill, MD Primary Cardiologist: Dr. Rennis Golden for dyslipidemia Primary Electrophysiologist: None Referring Physician: [facility]   Tiffany Becker is a 77 y.o. female with a history of elevated coronary calcium score - 724 (93rd percentile), HLD, HTN, T2DM, osteoarthritis, atrial fibrillation who presents for consultation in the [facility]. The patient was initially diagnosed with atrial fibrillation on 08/01/22 after presenting to [facility] ED in Afib with RVR. She converted to NSR after cardizem and IV fluids by EMS. Given diltiazem 30 mg prn. Patient is on Eliquis 5 mg BID for a CHADS2VASC score of 6.  On evaluation today, she is currently in NSR. She notes that the day before ED visit she overexerted herself cooking for a family reunion. She notes may have had Afib 5 years ago due to similar symptoms; occurred after she helped her brother move. She has a BP cuff at home which alerts her when she possibly has an abnormal rhythm. She feels when she is in Afib due to palpitations.  She does not drink alcohol. She drinks 1-2 cups of coffee daily. She believes she does snore and at least historically felt like she stopped breathing at night.  She is compliant with anticoagulation and has not missed any doses. She has no bleeding concerns.  On follow up 09/07/22, she is currently in NSR. Since last office visit, she thinks she has had multiple episodes of Afib. Her BP cuff will alert her if there is an irregular heart beat and she has had that intermittently since last office visit. However, she only sometimes will feel the palpitations she did previously and notes it tends to happen in the afternoon. No missed doses of Eliquis.   On follow up 10/13/22, she is currently in NSR. Cardiac monitor worn 6/26-7/3 did not show any episodes of Afib. She has not had any episodes of Afib since last office visit. She received her CPAP equipment  2 days ago and is still getting used to it. She will sporadically take celebrex prn arthritic pain. No bleeding issues on Eliquis.   Today, she denies symptoms of chest pain, shortness of breath, orthopnea, PND, lower extremity edema, dizziness, presyncope, syncope, snoring, daytime somnolence, bleeding, or neurologic sequela. The patient is tolerating medications without difficulties and is otherwise without complaint today.   Atrial Fibrillation Risk Factors:  she does have symptoms or diagnosis of sleep apnea. she does not have a history of rheumatic fever. she does not have a history of alcohol use. The patient does not have a history of early familial atrial fibrillation or other arrhythmias.  she has a BMI of Body mass index is 29.3 kg/m.Marland Kitchen Filed Weights   10/13/22 1340  Weight: 90 kg     Family History  Problem Relation Age of Onset   COPD Mother    Osteoarthritis Mother    Asthma Mother    Heart disease Mother    Coronary artery disease Father 54       died age 52   Heart disease Father    Stroke Father    Diabetes Brother        bladder, pacemaker, Hemochromatosis   Cancer Brother 48       Bladder    Atrial Fibrillation Management history:  Previous antiarrhythmic drugs: None Previous cardioversions: None Previous ablations: None Anticoagulation history: Eliquis 5 mg BID   Past Medical History:  Diagnosis Date   Abnormal renal ultrasound 04/25/2002  Diabetes mellitus without complication (HCC)    Diverticulosis of colon 08/12/2004   GERD (gastroesophageal reflux disease)    H pylori neg 08/21/02   Hypertension    Peptic ulcer disease 03/14/1980   EGD   Primary osteoarthritis of both hips 06/13/2007   Dr Turner Daniels   Pyelonephritis 01/12/2001   Vitreous detachment    Past Surgical History:  Procedure Laterality Date   BREAST REDUCTION SURGERY  03/14/93   BUNIONECTOMY  12/09   L with 2nd and 5th toe straightening   CERVICAL DISCECTOMY  03/14/06   C6-7    CHOLECYSTECTOMY  01/12/89   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   SALPINGOOPHORECTOMY  09/11/97   ruptured cyst   TONSILLECTOMY  03/14/52   TOTAL ABDOMINAL HYSTERECTOMY  01/12/89   L salpingoophorectomy for ruptured cyst   TUBAL LIGATION  03/14/72   UMBILICAL HERNIA REPAIR  03/14/64    Current Outpatient Medications  Medication Sig Dispense Refill   amLODipine (NORVASC) 10 MG tablet Take 5 mg by mouth in the morning and at bedtime.     apixaban (ELIQUIS) 5 MG TABS tablet Take 1 tablet (5 mg total) by mouth 2 (two) times daily. 180 tablet 1   carvedilol (COREG) 3.125 MG tablet Take 1 tablet (3.125 mg total) by mouth 2 (two) times daily with a meal. 180 tablet 3   diltiazem (CARDIZEM) 30 MG tablet Take 1 tablet (30 mg total) by mouth 3 (three) times daily as needed (for heart rate >110). 60 tablet 2   glipiZIDE (GLUCOTROL XL) 2.5 MG 24 hr tablet Take 1 tablet (2.5 mg total) by mouth daily with breakfast. 90 tablet 3   lisinopril (ZESTRIL) 40 MG tablet Take 1 tablet (40 mg total) by mouth daily. 90 tablet 3   MAGNESIUM PO Taking 1 gummy by mouth daily     metFORMIN (GLUCOPHAGE) 1000 MG tablet Take 1 tablet (1,000 mg total) by mouth 2 (two) times daily with a meal. 200 tablet 2   Multiple Vitamins-Minerals (MULTIVITAMIN GUMMIES ADULT PO) Taking 1 gummy by mouth several times a week     rosuvastatin (CRESTOR) 5 MG tablet Take 1 tablet (5 mg total) by mouth daily. 90 tablet 3   VITAMIN D PO Take by mouth.     No current facility-administered medications for this encounter.    Allergies  Allergen Reactions   Statins Other (See Comments)    Simvastatin: Hair loss and leg pain Atorvastatin: Hair Loss Pravasatin: Myalgia Rosuvastatin: High doses lead to myalgia/joint pain    ROS- All systems are reviewed and negative except as per the HPI above.  Physical Exam: Vitals:   10/13/22 1340  BP: 122/66  Pulse: 61  Weight: 90 kg  Height: 5\' 9"  (1.753 m)    GEN- The patient is well appearing, alert  and oriented x 3 today.   Neck - no JVD or carotid bruit noted Lungs- Clear to ausculation bilaterally, normal work of breathing Heart- Regular rate and rhythm, no murmurs, rubs or gallops, PMI not laterally displaced Extremities- no clubbing, cyanosis, or edema Skin - no rash or ecchymosis noted  Wt Readings from Last 3 Encounters:  10/13/22 90 kg  09/07/22 90.5 kg  08/30/22 89.8 kg   EKG today demonstrates  Vent. rate 61 BPM PR interval 132 ms QRS duration 76 ms QT/QTcB 354/356 ms P-R-T axes 64 44 73 Normal sinus rhythm with sinus arrhythmia Normal ECG When compared with ECG of 07-Sep-2022 14:16, PREVIOUS ECG IS PRESENT  Echo 08/25/22: 1.  Left ventricular ejection fraction, by estimation, is 60 to 65%. The  left ventricle has normal function. The left ventricle has no regional  wall motion abnormalities. Left ventricular diastolic parameters were  normal.   2. Right ventricular systolic function is normal. The right ventricular  size is normal. Tricuspid regurgitation signal is inadequate for assessing  PA pressure.   3. The mitral valve is normal in structure. Trivial mitral valve  regurgitation. No evidence of mitral stenosis.   4. The aortic valve is normal in structure. Aortic valve regurgitation is  not visualized. No aortic stenosis is present.   5. The inferior vena cava is normal in size with greater than 50%  respiratory variability, suggesting right atrial pressure of 3 mmHg.   Cardiac monitor 6/26-09/14/2022: Patch Wear Time:  6 days and 17 hours    Predominant rhythm was sinus rhythm Patient had a min HR of 49 bpm, max HR of 188 bpm, and avg HR of 69 bpm. 18 SVT episodes, all less than 16 seconds Less than 1% ventricular and supraventricular ectopy No patient triggered episodes recorded   Will Camnitz, MD  Epic records are reviewed at length today.  CHA2DS2-VASc Score = 6  The patient's score is based upon: CHF History: 0 HTN History: 1 Diabetes  History: 1 Stroke History: 0 Vascular Disease History: 1 Age Score: 2 Gender Score: 1       ASSESSMENT AND PLAN: Paroxysmal Atrial Fibrillation (ICD10:  I48.0) The patient's CHA2DS2-VASc score is 6, indicating a 9.7% annual risk of stroke.    She is currently in NSR. We will continue with conservative observation for now.   We briefly went over AAD options and ablation previously; patient would rather choose AAD over ablation if needed.  Secondary Hypercoagulable State (ICD10:  D68.69) The patient is at significant risk for stroke/thromboembolism based upon her CHA2DS2-VASc Score of 6.  Continue Apixaban (Eliquis).  Continue without interruption.  3. Obesity Body mass index is 29.3 kg/m. Lifestyle modification was discussed at length including regular exercise and weight reduction. Encouraged daily walking as tolerated.  4. Obstructive sleep apnea She is using her CPAP machine.  5. HTN Stable today, continue to trend.   Follow up 3 months after Dr. Blanchie Dessert office visit.    Lake Bells, PA-C [facility] [address] [place], [place] [address] [phone] 10/13/2022 2:10 PM

## 2022-10-19 LAB — HM DIABETES EYE EXAM

## 2022-10-21 ENCOUNTER — Telehealth: Payer: Self-pay

## 2022-10-21 MED ORDER — AMLODIPINE BESYLATE 5 MG PO TABS: 5.0000 mg | ORAL_TABLET | Freq: Two times a day (BID) | ORAL | 3 refills | Status: DC

## 2022-10-21 NOTE — Telephone Encounter (Signed)
Patient calls nurse line requesting a prescription for Amlodipine 5mg .   She reports she has 10mg  and has been cutting in half, however she reports the tabs do not "cut well."   She is requesting Amlodipine 5mg  BID to [employer] order.   Will forward to PCP.

## 2022-10-21 NOTE — Telephone Encounter (Signed)
Rx sent Tiffany Rio, MD

## 2022-10-28 ENCOUNTER — Ambulatory Visit: Payer: Medicare Other

## 2022-10-28 VITALS — Ht 69.0 in | Wt 198.0 lb

## 2022-10-28 DIAGNOSIS — Z0000 Encounter for general adult medical examination without abnormal findings: Secondary | ICD-10-CM | POA: Diagnosis not present

## 2022-10-28 NOTE — Progress Notes (Signed)
Subjective:   Tiffany Becker is a 77 y.o. female who presents for Medicare Annual (Subsequent) preventive examination.  Visit Complete: Virtual  I connected with  Tiffany Becker on 10/28/22 by a audio enabled telemedicine application and verified that I am speaking with the correct person using two identifiers.  Patient Location: Home  Provider Location: Home Office  I discussed the limitations of evaluation and management by telemedicine. The patient expressed understanding and agreed to proceed.  Vital Signs: Because this visit was a virtual/telehealth visit, some criteria may be missing or patient reported. Any vitals not documented were not able to be obtained and vitals that have been documented are patient reported.   Review of Systems     Cardiac Risk Factors include: advanced age (>34men, >18 women);diabetes mellitus;hypertension     Objective:    Today's Vitals   10/28/22 0851  Weight: 198 lb (89.8 kg)  Height: 5\' 9"  (1.753 m)   Body mass index is 29.24 kg/m.     10/28/2022    9:36 AM 08/16/2022   10:30 AM 08/01/2022    1:47 AM 07/14/2022   10:13 AM 02/09/2022    9:56 AM 01/24/2022   11:58 AM 04/27/2021    1:35 PM  Advanced Directives  Does Patient Have a Medical Advance Directive? Yes No No No Yes No Yes  Type of Estate agent of [place];Living will    Healthcare Power of [place];Living will  Healthcare Power of [place];Living will  Copy of Healthcare Power of Attorney in Chart? No - copy requested    No - copy requested  No - copy requested  Would patient like information on creating a medical advance directive?  No - Patient declined  No - Patient declined       Current Medications (verified) Outpatient Encounter Medications as of 10/28/2022  Medication Sig   amLODipine (NORVASC) 5 MG tablet Take 1 tablet (5 mg total) by mouth in the morning and at bedtime.   apixaban (ELIQUIS) 5 MG TABS tablet Take 1 tablet (5 mg total) by mouth 2  (two) times daily.   carvedilol (COREG) 3.125 MG tablet Take 1 tablet (3.125 mg total) by mouth 2 (two) times daily with a meal.   diltiazem (CARDIZEM) 30 MG tablet Take 1 tablet (30 mg total) by mouth 3 (three) times daily as needed (for heart rate >110).   glipiZIDE (GLUCOTROL XL) 2.5 MG 24 hr tablet Take 1 tablet (2.5 mg total) by mouth daily with breakfast.   lisinopril (ZESTRIL) 40 MG tablet Take 1 tablet (40 mg total) by mouth daily.   MAGNESIUM PO Taking 1 gummy by mouth daily   metFORMIN (GLUCOPHAGE) 1000 MG tablet Take 1 tablet (1,000 mg total) by mouth 2 (two) times daily with a meal.   Multiple Vitamins-Minerals (MULTIVITAMIN GUMMIES ADULT PO) Taking 1 gummy by mouth several times a week   rosuvastatin (CRESTOR) 5 MG tablet Take 1 tablet (5 mg total) by mouth daily.   VITAMIN D PO Take by mouth.   No facility-administered encounter medications on file as of 10/28/2022.    Allergies (verified) Statins   History: Past Medical History:  Diagnosis Date   Abnormal renal ultrasound 04/25/2002   Diabetes mellitus without complication (HCC)    Diverticulosis of colon 08/12/2004   GERD (gastroesophageal reflux disease)    H pylori neg 08/21/02   Hypertension    Peptic ulcer disease 03/14/1980   EGD   Primary osteoarthritis of both hips 06/13/2007  Dr Turner Daniels   Pyelonephritis 01/12/2001   Vitreous detachment    Past Surgical History:  Procedure Laterality Date   BREAST REDUCTION SURGERY  03/14/93   BUNIONECTOMY  12/09   L with 2nd and 5th toe straightening   CERVICAL DISCECTOMY  03/14/06   C6-7   CHOLECYSTECTOMY  01/12/89   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   SALPINGOOPHORECTOMY  09/11/97   ruptured cyst   TONSILLECTOMY  03/14/52   TOTAL ABDOMINAL HYSTERECTOMY  01/12/89   L salpingoophorectomy for ruptured cyst   TUBAL LIGATION  03/14/72   UMBILICAL HERNIA REPAIR  03/14/64   Family History  Problem Relation Age of Onset   COPD Mother    Osteoarthritis Mother    Asthma Mother     Heart disease Mother    Coronary artery disease Father 87       died age 49   Heart disease Father    Stroke Father    Diabetes Brother        bladder, pacemaker, Hemochromatosis   Cancer Brother 70       Bladder   Social History   Socioeconomic History   Marital status: Married    Spouse name: John   Number of children: 2   Years of education: 12   Highest education level: 12th grade  Occupational History   Occupation: Retired-Sales of store fixtures  Tobacco Use   Smoking status: Never    Passive exposure: Never   Smokeless tobacco: Never  Vaping Use   Vaping status: Never Used  Substance and Sexual Activity   Alcohol use: No   Drug use: No   Sexual activity: Not Currently  Other Topics Concern   Not on file  Social History Narrative   Retired- Nurse, learning disability with 2 levels, 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 Smoke detectors, no tripping hazards.Has grab bars in bathroom.   2 daughters in the area; [place] and [place]. One grandson locally.    Emergency Contact: husband, Jonny Ruiz [phone]   End of Life Plan: pt has advance directives but does not have in chart, requested copy from family.   Who lives with you: husband    Any pets: dog-Jack   Diet: Pt has a varied diet of protein, starch and vegetables.   Exercise: Patient reports she is very active throughout the day. Walking the dog, running errands and house chores.    Seatbelts: Pt reports wearing seatbelt when in vehicles.    [place] Exposure/Protection: Pt reports using sunscreen regularly.    Hobbies: church, walking, yard work, cooking         Social Determinants of Corporate investment banker Strain: Low Risk  (10/28/2022)   Overall Financial Resource Strain (CARDIA)    Difficulty of Paying Living Expenses: Not hard at all  Food Insecurity: No Food Insecurity (10/28/2022)   Hunger Vital Sign    Worried About Running Out of Food in the Last Year: Never true    Ran Out of Food in the Last Year: Never true   Transportation Needs: No Transportation Needs (10/28/2022)   PRAPARE - Administrator, Civil Service (Medical): No    Lack of Transportation (Non-Medical): No  Physical Activity: Insufficiently Active (10/28/2022)   Exercise Vital Sign    Days of Exercise per Week: 3 days    Minutes of Exercise per Session: 30 min  Stress: No Stress Concern Present (10/28/2022)   [employer] of Occupational Health - Occupational Stress Questionnaire    Feeling  of Stress : Not at all  Social Connections: Socially Integrated (10/28/2022)   Social Connection and Isolation Panel [NHANES]    Frequency of Communication with Friends and Family: More than three times a week    Frequency of Social Gatherings with Friends and Family: Three times a week    Attends Religious Services: More than 4 times per year    Active Member of Clubs or Organizations: Yes    Attends Engineer, structural: More than 4 times per year    Marital Status: Married    Tobacco Counseling Counseling given: Not Answered   Clinical Intake:  Pre-visit preparation completed: Yes  Pain : No/denies pain     Diabetes: Yes CBG done?: No Did pt. bring in CBG monitor from home?: No  How often do you need to have someone help you when you read instructions, pamphlets, or other written materials from your doctor or pharmacy?: 1 - Never  Interpreter Needed?: No  Information entered by :: Kandis Fantasia LPN   Activities of Daily Living    10/28/2022    9:36 AM 02/09/2022    9:56 AM  In your present state of health, do you have any difficulty performing the following activities:  Hearing? 0 0  Vision? 0 0  Difficulty concentrating or making decisions? 0 0  Walking or climbing stairs? 0 0  Dressing or bathing? 0 0  Doing errands, shopping? 0 0  Preparing Food and eating ? N N  Using the Toilet? N N  In the past six months, have you accidently leaked urine? N Y  Do you have problems with loss of bowel  control? N N  Managing your Medications? N N  Managing your Finances? N N  Housekeeping or managing your Housekeeping? N N    Patient Care Team: Latrelle Dodrill, MD as PCP - General (Family Medicine) Holli Humbles, MD (Ophthalmology) Elinor Parkinson, DPM as Consulting Physician (Podiatry) Rennis Golden, Lisette Abu, MD as Consulting Physician (Cardiology)  Indicate any recent Medical Services you may have received from other than [facility] providers in the past year (date may be approximate).     Assessment:   This is a routine wellness examination for [place].  Hearing/Vision screen Hearing Screening - Comments:: Denies hearing difficulties   Vision Screening - Comments:: Wears rx glasses - up to date with routine eye exams with Dr. Valere Dross   Dietary issues and exercise activities discussed:     Goals Addressed   None   Depression Screen    10/28/2022    9:34 AM 08/16/2022   10:30 AM 07/14/2022   10:12 AM 02/09/2022    9:55 AM 01/24/2022   11:58 AM 07/27/2021   12:14 PM 04/27/2021    1:44 PM  PHQ 2/9 Scores  PHQ - 2 Score 0 0 0 0 0 0 0  PHQ- 9 Score 0 0 0 0 0 0 0    Fall Risk    10/28/2022    9:36 AM 08/16/2022   10:30 AM 07/14/2022   10:12 AM 01/24/2022   12:05 PM 03/29/2021   10:39 AM  Fall Risk   Falls in the past year? 0 0 0 0 0  Number falls in past yr: 0  0  0  Injury with Fall? 0  0  0  Risk for fall due to : No Fall Risks    No Fall Risks  Follow up Falls prevention discussed;Education provided;Falls evaluation completed  MEDICARE RISK AT HOME:  Medicare Risk at Home - 10/28/22 0936     Any stairs in or around the home? No    If so, are there any without handrails? No    Home free of loose throw rugs in walkways, pet beds, electrical cords, etc? Yes    Adequate lighting in your home to reduce risk of falls? Yes    Life alert? No    Use of a cane, walker or w/c? No    Grab bars in the bathroom? Yes    Shower chair or bench in shower? No    Elevated  toilet seat or a handicapped toilet? Yes             TIMED UP AND GO:  Was the test performed?  No    Cognitive Function:    04/23/2018    1:55 PM 07/02/2013    1:00 PM  MMSE - Mini Mental State Exam  Orientation to time 5 5  Orientation to Place 5 5  Registration 3 3  Attention/ Calculation 5 5  Recall 3 3  Language- name 2 objects 2 2  Language- repeat 1 1  Language- follow 3 step command 3 3  Language- read & follow direction 1 1  Write a sentence 1 1  Copy design 1 1  Total score 30 30        10/28/2022    9:36 AM 02/09/2022    9:57 AM 04/23/2018    1:56 PM  6CIT Screen  What Year? 0 points 0 points 0 points  What month? 0 points 0 points 0 points  What time? 0 points 0 points 0 points  Count back from 20 0 points 0 points 0 points  Months in reverse 0 points 0 points 0 points  Repeat phrase 0 points 0 points 0 points  Total Score 0 points 0 points 0 points    Immunizations Immunization History  Administered Date(s) Administered   H1N1 02/25/2008   Influenza Split 12/06/2011   Influenza Whole 12/18/2006, 12/14/2007, 01/06/2009   Influenza,inj,Quad PF,6+ Mos 11/27/2012, 12/09/2013, 11/21/2014, 11/27/2015, 12/23/2016, 12/06/2017, 11/27/2018, 12/03/2019, 11/24/2020   Influenza-Unspecified 12/27/2018, 12/26/2021   PFIZER(Purple Top)SARS-COV-2 Vaccination 04/04/2019, 04/25/2019, 02/13/2020   Pneumococcal Conjugate-13 07/02/2013   Pneumococcal Polysaccharide-23 02/11/2001, 10/28/2014   Td 05/12/2004   Tdap 11/04/2014   Zoster, Live 08/09/2007    TDAP status: Up to date  Flu Vaccine status: Due, Education has been provided regarding the importance of this vaccine. Advised may receive this vaccine at local pharmacy or Health Dept. Aware to provide a copy of the vaccination record if obtained from local pharmacy or Health Dept. Verbalized acceptance and understanding.  Pneumococcal vaccine status: Up to date  Covid-19 vaccine status: Information provided on  how to obtain vaccines.   Qualifies for Shingles Vaccine? Yes   Zostavax completed Yes   Shingrix Completed?: No.    Education has been provided regarding the importance of this vaccine. Patient has been advised to call insurance company to determine out of pocket expense if they have not yet received this vaccine. Advised may also receive vaccine at local pharmacy or Health Dept. Verbalized acceptance and understanding.  Screening Tests Health Maintenance  Topic Date Due   Zoster Vaccines- Shingrix (1 of 2) 11/29/1964   FOOT EXAM  02/03/2021   COVID-19 Vaccine (4 - 2023-24 season) 11/12/2021   OPHTHALMOLOGY EXAM  09/30/2022   INFLUENZA VACCINE  10/13/2022   HEMOGLOBIN A1C  01/14/2023   Diabetic kidney  evaluation - Urine ACR  01/25/2023   Diabetic kidney evaluation - eGFR measurement  08/01/2023   Medicare Annual Wellness (AWV)  10/28/2023   DTaP/Tdap/Td (3 - Td or Tdap) 11/03/2024   Fecal DNA (Cologuard)  01/29/2025   Pneumonia Vaccine 74+ Years old  Completed   DEXA SCAN  Completed   Hepatitis C Screening  Completed   HPV VACCINES  Aged Out   Colonoscopy  Discontinued    Health Maintenance  Health Maintenance Due  Topic Date Due   Zoster Vaccines- Shingrix (1 of 2) 11/29/1964   FOOT EXAM  02/03/2021   COVID-19 Vaccine (4 - 2023-24 season) 11/12/2021   OPHTHALMOLOGY EXAM  09/30/2022   INFLUENZA VACCINE  10/13/2022    Colorectal cancer screening: Type of screening: Cologuard. Completed 01/29/22. Repeat every 3 years  Mammogram status: Completed 02/18/22. Repeat every year  Bone Density status: Completed 04/29/19. Results reflect: Bone density results: OSTEOPENIA. Repeat every 2 years.  Lung Cancer Screening: (Low Dose CT Chest recommended if Age 19-80 years, 20 pack-year currently smoking OR have quit w/in 15years.) does not qualify.   Lung Cancer Screening Referral: n/a  Additional Screening:  Hepatitis C Screening: does qualify; Completed 10/27/15  Vision Screening:  Recommended annual ophthalmology exams for early detection of glaucoma and other disorders of the eye. Is the patient up to date with their annual eye exam?  Yes  Who is the provider or what is the name of the office in which the patient attends annual eye exams? Dr. Valere Dross If pt is not established with a provider, would they like to be referred to a provider to establish care? No .   Dental Screening: Recommended annual dental exams for proper oral hygiene  Diabetic Foot Exam: Diabetic Foot Exam: Overdue, Pt has been advised about the importance in completing this exam. Pt is scheduled for diabetic foot exam on at next office visit.  Community Resource Referral / Chronic Care Management: CRR required this visit?  No   CCM required this visit?  No     Plan:     I have personally reviewed and noted the following in the patient's chart:   Medical and social history Use of alcohol, tobacco or illicit drugs  Current medications and supplements including opioid prescriptions. Patient is not currently taking opioid prescriptions. Functional ability and status Nutritional status Physical activity Advanced directives List of other physicians Hospitalizations, surgeries, and ER visits in previous 12 months Vitals Screenings to include cognitive, depression, and falls Referrals and appointments  In addition, I have reviewed and discussed with patient certain preventive protocols, quality metrics, and best practice recommendations. A written personalized care plan for preventive services as well as general preventive health recommendations were provided to patient.     Kandis Fantasia [place], [place]   1/61/0960   After Visit Summary: (MyChart) Due to this being a telephonic visit, the after visit summary with patients personalized plan was offered to patient via MyChart   Nurse Notes: No concerns at this time

## 2022-10-28 NOTE — Patient Instructions (Signed)
Tiffany Becker , Thank you for taking time to come for your Medicare Wellness Visit. I appreciate your ongoing commitment to your health goals. Please review the following plan we discussed and let me know if I can assist you in the future.   Referrals/Orders/Follow-Ups/Clinician Recommendations: Aim for 30 minutes of exercise or brisk walking, 6-8 glasses of water, and 5 servings of fruits and vegetables each day.  This is a list of the screening recommended for you and due dates:  Health Maintenance  Topic Date Due   Zoster (Shingles) Vaccine (1 of 2) 11/29/1964   Complete foot exam   02/03/2021   COVID-19 Vaccine (4 - 2023-24 season) 11/12/2021   Eye exam for diabetics  09/30/2022   Flu Shot  10/13/2022   Hemoglobin A1C  01/14/2023   Yearly kidney health urinalysis for diabetes  01/25/2023   Yearly kidney function blood test for diabetes  08/01/2023   Medicare Annual Wellness Visit  10/28/2023   DTaP/Tdap/Td vaccine (3 - Td or Tdap) 11/03/2024   Cologuard (Stool DNA test)  01/29/2025   Pneumonia Vaccine  Completed   DEXA scan (bone density measurement)  Completed   Hepatitis C Screening  Completed   HPV Vaccine  Aged Out   Colon Cancer Screening  Discontinued    Advanced directives: (Copy Requested) Please bring a copy of your health care power of attorney and living will to the office to be added to your chart at your convenience.  Next Medicare Annual Wellness Visit scheduled for next year: Yes  Preventive Care 50 Years and Older, Female Preventive care refers to lifestyle choices and visits with your health care provider that can promote health and wellness. What does preventive care include? A yearly physical exam. This is also called an annual well check. Dental exams once or twice a year. Routine eye exams. Ask your health care provider how often you should have your eyes checked. Personal lifestyle choices, including: Daily care of your teeth and gums. Regular physical  activity. Eating a healthy diet. Avoiding tobacco and drug use. Limiting alcohol use. Practicing safe sex. Taking low-dose aspirin every day. Taking vitamin and mineral supplements as recommended by your health care provider. What happens during an annual well check? The services and screenings done by your health care provider during your annual well check will depend on your age, overall health, lifestyle risk factors, and family history of disease. Counseling  Your health care provider may ask you questions about your: Alcohol use. Tobacco use. Drug use. Emotional well-being. Home and relationship well-being. Sexual activity. Eating habits. History of falls. Memory and ability to understand (cognition). Work and work Astronomer. Reproductive health. Screening  You may have the following tests or measurements: Height, weight, and BMI. Blood pressure. Lipid and cholesterol levels. These may be checked every 5 years, or more frequently if you are over 40 years old. Skin check. Lung cancer screening. You may have this screening every year starting at age 64 if you have a 30-pack-year history of smoking and currently smoke or have quit within the past 15 years. Fecal occult blood test (FOBT) of the stool. You may have this test every year starting at age 42. Flexible sigmoidoscopy or colonoscopy. You may have a sigmoidoscopy every 5 years or a colonoscopy every 10 years starting at age 2. Hepatitis C blood test. Hepatitis B blood test. Sexually transmitted disease (STD) testing. Diabetes screening. This is done by checking your blood sugar (glucose) after you have not eaten for  a while (fasting). You may have this done every 1-3 years. Bone density scan. This is done to screen for osteoporosis. You may have this done starting at age 45. Mammogram. This may be done every 1-2 years. Talk to your health care provider about how often you should have regular mammograms. Talk with your  health care provider about your test results, treatment options, and if necessary, the need for more tests. Vaccines  Your health care provider may recommend certain vaccines, such as: Influenza vaccine. This is recommended every year. Tetanus, diphtheria, and acellular pertussis (Tdap, Td) vaccine. You may need a Td booster every 10 years. Zoster vaccine. You may need this after age 77. Pneumococcal 13-valent conjugate (PCV13) vaccine. One dose is recommended after age 62. Pneumococcal polysaccharide (PPSV23) vaccine. One dose is recommended after age 8. Talk to your health care provider about which screenings and vaccines you need and how often you need them. This information is not intended to replace advice given to you by your health care provider. Make sure you discuss any questions you have with your health care provider. Document Released: 03/27/2015 Document Revised: 11/18/2015 Document Reviewed: 12/30/2014 Elsevier Interactive Patient Education  2017 [employer].  Fall Prevention in the Home Falls can cause injuries. They can happen to people of all ages. There are many things you can do to make your home safe and to help prevent falls. What can I do on the outside of my home? Regularly fix the edges of walkways and driveways and fix any cracks. Remove anything that might make you trip as you walk through a door, such as a raised step or threshold. Trim any bushes or trees on the path to your home. Use bright outdoor lighting. Clear any walking paths of anything that might make someone trip, such as rocks or tools. Regularly check to see if handrails are loose or broken. Make sure that both sides of any steps have handrails. Any raised decks and porches should have guardrails on the edges. Have any leaves, snow, or ice cleared regularly. Use sand or salt on walking paths during winter. Clean up any spills in your garage right away. This includes oil or grease spills. What can I  do in the bathroom? Use night lights. Install grab bars by the toilet and in the tub and shower. Do not use towel bars as grab bars. Use non-skid mats or decals in the tub or shower. If you need to sit down in the shower, use a plastic, non-slip stool. Keep the floor dry. Clean up any water that spills on the floor as soon as it happens. Remove soap buildup in the tub or shower regularly. Attach bath mats securely with double-sided non-slip rug tape. Do not have throw rugs and other things on the floor that can make you trip. What can I do in the bedroom? Use night lights. Make sure that you have a light by your bed that is easy to reach. Do not use any sheets or blankets that are too big for your bed. They should not hang down onto the floor. Have a firm chair that has side arms. You can use this for support while you get dressed. Do not have throw rugs and other things on the floor that can make you trip. What can I do in the kitchen? Clean up any spills right away. Avoid walking on wet floors. Keep items that you use a lot in easy-to-reach places. If you need to reach something above  you, use a strong step stool that has a grab bar. Keep electrical cords out of the way. Do not use floor polish or wax that makes floors slippery. If you must use wax, use non-skid floor wax. Do not have throw rugs and other things on the floor that can make you trip. What can I do with my stairs? Do not leave any items on the stairs. Make sure that there are handrails on both sides of the stairs and use them. Fix handrails that are broken or loose. Make sure that handrails are as long as the stairways. Check any carpeting to make sure that it is firmly attached to the stairs. Fix any carpet that is loose or worn. Avoid having throw rugs at the top or bottom of the stairs. If you do have throw rugs, attach them to the floor with carpet tape. Make sure that you have a light switch at the top of the stairs  and the bottom of the stairs. If you do not have them, ask someone to add them for you. What else can I do to help prevent falls? Wear shoes that: Do not have high heels. Have rubber bottoms. Are comfortable and fit you well. Are closed at the toe. Do not wear sandals. If you use a stepladder: Make sure that it is fully opened. Do not climb a closed stepladder. Make sure that both sides of the stepladder are locked into place. Ask someone to hold it for you, if possible. Clearly mark and make sure that you can see: Any grab bars or handrails. First and last steps. Where the edge of each step is. Use tools that help you move around (mobility aids) if they are needed. These include: Canes. Walkers. Scooters. Crutches. Turn on the lights when you go into a dark area. Replace any light bulbs as soon as they burn out. Set up your furniture so you have a clear path. Avoid moving your furniture around. If any of your floors are uneven, fix them. If there are any pets around you, be aware of where they are. Review your medicines with your doctor. Some medicines can make you feel dizzy. This can increase your chance of falling. Ask your doctor what other things that you can do to help prevent falls. This information is not intended to replace advice given to you by your health care provider. Make sure you discuss any questions you have with your health care provider. Document Released: 12/25/2008 Document Revised: 08/06/2015 Document Reviewed: 04/04/2014 Elsevier Interactive Patient Education  2017 [employer].

## 2022-11-08 DIAGNOSIS — G4733 Obstructive sleep apnea (adult) (pediatric): Secondary | ICD-10-CM | POA: Diagnosis not present

## 2022-11-11 ENCOUNTER — Encounter: Payer: Self-pay | Admitting: Family Medicine

## 2022-11-11 DIAGNOSIS — G4733 Obstructive sleep apnea (adult) (pediatric): Secondary | ICD-10-CM | POA: Diagnosis not present

## 2022-11-26 ENCOUNTER — Other Ambulatory Visit: Payer: Self-pay

## 2022-11-26 ENCOUNTER — Emergency Department (HOSPITAL_BASED_OUTPATIENT_CLINIC_OR_DEPARTMENT_OTHER)
Admission: EM | Admit: 2022-11-26 | Discharge: 2022-11-26 | Disposition: A | Discharge status: Home / Self Care | Payer: Medicare Other | Attending: Emergency Medicine | Admitting: Emergency Medicine

## 2022-11-26 ENCOUNTER — Telehealth: Payer: Self-pay | Admitting: Family Medicine

## 2022-11-26 DIAGNOSIS — R519 Headache, unspecified: Secondary | ICD-10-CM | POA: Diagnosis not present

## 2022-11-26 DIAGNOSIS — Z7901 Long term (current) use of anticoagulants: Secondary | ICD-10-CM | POA: Insufficient documentation

## 2022-11-26 DIAGNOSIS — B029 Zoster without complications: Secondary | ICD-10-CM | POA: Diagnosis not present

## 2022-11-26 LAB — CBC WITH DIFFERENTIAL/PLATELET
Abs Immature Granulocytes: 0.01 10*3/uL (ref 0.00–0.07)
Basophils Absolute: 0.1 10*3/uL (ref 0.0–0.1)
Basophils Relative: 1 %
Eosinophils Absolute: 0.1 10*3/uL (ref 0.0–0.5)
Eosinophils Relative: 2 %
HCT: 38.3 % (ref 36.0–46.0)
Hemoglobin: 13 g/dL (ref 12.0–15.0)
Immature Granulocytes: 0 %
Lymphocytes Relative: 33 %
Lymphs Abs: 2.5 10*3/uL (ref 0.7–4.0)
MCH: 30.8 pg (ref 26.0–34.0)
MCHC: 33.9 g/dL (ref 30.0–36.0)
MCV: 90.8 fL (ref 80.0–100.0)
Monocytes Absolute: 0.6 10*3/uL (ref 0.1–1.0)
Monocytes Relative: 8 %
Neutro Abs: 4.3 10*3/uL (ref 1.7–7.7)
Neutrophils Relative %: 56 %
Platelets: 225 10*3/uL (ref 150–400)
RBC: 4.22 MIL/uL (ref 3.87–5.11)
RDW: 12.8 % (ref 11.5–15.5)
WBC: 7.6 10*3/uL (ref 4.0–10.5)
nRBC: 0 % (ref 0.0–0.2)

## 2022-11-26 LAB — SEDIMENTATION RATE: Sed Rate: 9 mm/h (ref 0–22)

## 2022-11-26 MED ORDER — TETRACAINE HCL 0.5 % OP SOLN
2.0000 [drp] | Freq: Once | OPHTHALMIC | Status: AC
  Administered 2022-11-26: 2 [drp] via OPHTHALMIC
  Filled 2022-11-26: qty 4

## 2022-11-26 MED ORDER — FLUORESCEIN SODIUM 1 MG OP STRP
1.0000 | ORAL_STRIP | Freq: Once | OPHTHALMIC | Status: AC
  Administered 2022-11-26: 1 via OPHTHALMIC
  Filled 2022-11-26: qty 1

## 2022-11-26 MED ORDER — VALACYCLOVIR HCL 1 G PO TABS: 1000.0000 mg | ORAL_TABLET | Freq: Three times a day (TID) | ORAL | 0 refills | Status: DC

## 2022-11-26 MED ORDER — KETOROLAC TROMETHAMINE 15 MG/ML IJ SOLN
10.0000 mg | Freq: Once | INTRAMUSCULAR | Status: AC
  Administered 2022-11-26: 10 mg via INTRAVENOUS
  Filled 2022-11-26: qty 1

## 2022-11-26 MED ORDER — METOCLOPRAMIDE HCL 5 MG/ML IJ SOLN
5.0000 mg | Freq: Once | INTRAMUSCULAR | Status: AC
  Administered 2022-11-26: 5 mg via INTRAVENOUS
  Filled 2022-11-26: qty 2

## 2022-11-26 NOTE — ED Provider Notes (Addendum)
[place] [facility] AT [facility] Provider Note   CSN: [id number] Arrival date & time: 11/26/22  1518     History  Chief Complaint  Patient presents with   Headache    Tiffany Becker is a 77 y.o. female.  Is a 77 year old female is here today for headache.  She says that her symptoms started about 2 days ago.  She is concerned that she might have shingles.  Patient called her PCP who recommended she come to the emergency department for evaluation.  Patient has a history of A-fib on Eliquis.  No trauma.   Headache      Home Medications Prior to Admission medications   Medication Sig Start Date End Date Taking? Authorizing Provider  valACYclovir (VALTREX) 1000 MG tablet Take 1 tablet (1,000 mg total) by mouth 3 (three) times daily. 11/26/22  Yes Anders Simmonds T, DO  amLODipine (NORVASC) 5 MG tablet Take 1 tablet (5 mg total) by mouth in the morning and at bedtime. 10/21/22   Latrelle Dodrill, MD  apixaban (ELIQUIS) 5 MG TABS tablet Take 1 tablet (5 mg total) by mouth 2 (two) times daily. 09/30/22   Eustace Pen, PA-C  carvedilol (COREG) 3.125 MG tablet Take 1 tablet (3.125 mg total) by mouth 2 (two) times daily with a meal. 08/16/22   Alicia Amel, MD  diltiazem (CARDIZEM) 30 MG tablet Take 1 tablet (30 mg total) by mouth 3 (three) times daily as needed (for heart rate >110). 08/09/22   Eustace Pen, PA-C  glipiZIDE (GLUCOTROL XL) 2.5 MG 24 hr tablet Take 1 tablet (2.5 mg total) by mouth daily with breakfast. 07/18/22   Latrelle Dodrill, MD  lisinopril (ZESTRIL) 40 MG tablet Take 1 tablet (40 mg total) by mouth daily. 07/14/22   Latrelle Dodrill, MD  MAGNESIUM PO Taking 1 gummy by mouth daily    [provider]  metFORMIN (GLUCOPHAGE) 1000 MG tablet Take 1 tablet (1,000 mg total) by mouth 2 (two) times daily with a meal. 07/14/22   Latrelle Dodrill, MD  Multiple Vitamins-Minerals (MULTIVITAMIN GUMMIES ADULT PO) Taking 1 gummy by  mouth several times a week    [provider]  rosuvastatin (CRESTOR) 5 MG tablet Take 1 tablet (5 mg total) by mouth daily. 08/30/22   Hilty, Lisette Abu, MD  VITAMIN D PO Take by mouth.    [provider]      Allergies    Statins    Review of Systems   Review of Systems  Neurological:  Positive for headaches.    Physical Exam Updated Vital Signs BP (!) 150/80 (BP Location: Left Arm)   Pulse 72   Temp 97.9 F (36.6 C) (Oral)   Resp 16   Ht 5\' 9"  (1.753 m)   Wt 88.5 kg   SpO2 97%   BMI 28.80 kg/m  Physical Exam Vitals reviewed.  HENT:     Head: Normocephalic and atraumatic.     Right Ear: Tympanic membrane normal.     Left Ear: Tympanic membrane normal.  Eyes:     General: No visual field deficit.    Extraocular Movements: Extraocular movements intact.     Right eye: Normal extraocular motion and no nystagmus.     Left eye: Normal extraocular motion and no nystagmus.     Pupils: Pupils are equal, round, and reactive to light.     Comments: No conjunctival irritation, normal fluorescein stain.  Vision grossly normal.  Cardiovascular:     Rate and Rhythm: Normal rate.  Pulmonary:     Effort: No respiratory distress.  Musculoskeletal:     Cervical back: Normal range of motion.  Neurological:     Mental Status: She is alert.     Cranial Nerves: No cranial nerve deficit, dysarthria or facial asymmetry.     Sensory: No sensory deficit.     Motor: No weakness.     Gait: Gait normal.  Psychiatric:        Mood and Affect: Mood normal.     ED Results / Procedures / Treatments   Labs (all labs ordered are listed, but only abnormal results are displayed) Labs Reviewed  CBC WITH DIFFERENTIAL/PLATELET  SEDIMENTATION RATE  BASIC METABOLIC PANEL    EKG None  Radiology No results found.  Procedures Procedures    Medications Ordered in ED Medications  ketorolac (TORADOL) 15 MG/ML injection 10 mg (has no administration in time range)   metoCLOPramide (REGLAN) injection 5 mg (has no administration in time range)  tetracaine (PONTOCAINE) 0.5 % ophthalmic solution 2 drop (2 drops Left Eye Given by Other 11/26/22 1554)  fluorescein ophthalmic strip 1 strip (1 strip Left Eye Given 11/26/22 1554)    ED Course/ Medical Decision Making/ A&P                                 Medical Decision Making 77 year old female here today for left-sided facial pain, headache, left eye watering.  Differential diagnoses include shingles, temporal arteritis, less likely ICH.  Plan-will obtain sed rate, CBC on the patient to assess for possible temporal arteritis, although I think early shingles is more likely.  On exam, patient very sensitive to even light palpation of the hairline on the left side, no temporal artery tenderness.  No visual disturbances.  Ocular exam overall normal.  No rash at this time.  Discussed CT imaging of the head with the patient, and through shared decision making, patient preferred not to obtain imaging.  Likely discharge with Valtrex.  Sedimentation rate negative.  Will discharge patient with Valtrex.  Headache improved.  Amount and/or Complexity of Data Reviewed Labs: ordered.  Risk Prescription drug management.           Final Clinical Impression(s) / ED Diagnoses Final diagnoses:  Facial pain    Rx / DC Orders ED Discharge Orders          Ordered    valACYclovir (VALTREX) 1000 MG tablet  3 times daily        11/26/22 1733              Anders Simmonds T, DO 11/26/22 1734    Anders Simmonds T, DO 11/26/22 1747

## 2022-11-26 NOTE — ED Provider Notes (Signed)
[place] [facility] Provider Note   CSN: [id number] Arrival date & time: 11/26/22  1518     History  Chief Complaint  Patient presents with   Headache    Tiffany Becker is a 77 y.o. female.  This is a 77 year old female who is here today for pain over the left side of her forehead, watering in her left eye.  She is concerned that she may have shingles.  She says that the symptoms began about a day and a half ago.  She called her PCP today who recommended being evaluated in emergency room.  Reviewed the patient's PCP office note.  No dizziness, no difficulty with gait, no weakness.  No trauma.   Headache      Home Medications Prior to Admission medications   Medication Sig Start Date End Date Taking? Authorizing Provider  valACYclovir (VALTREX) 1000 MG tablet Take 1 tablet (1,000 mg total) by mouth 3 (three) times daily. 11/26/22  Yes Anders Simmonds T, DO  amLODipine (NORVASC) 5 MG tablet Take 1 tablet (5 mg total) by mouth in the morning and at bedtime. 10/21/22   Latrelle Dodrill, MD  apixaban (ELIQUIS) 5 MG TABS tablet Take 1 tablet (5 mg total) by mouth 2 (two) times daily. 09/30/22   Eustace Pen, PA-C  carvedilol (COREG) 3.125 MG tablet Take 1 tablet (3.125 mg total) by mouth 2 (two) times daily with a meal. 08/16/22   Alicia Amel, MD  diltiazem (CARDIZEM) 30 MG tablet Take 1 tablet (30 mg total) by mouth 3 (three) times daily as needed (for heart rate >110). 08/09/22   Eustace Pen, PA-C  glipiZIDE (GLUCOTROL XL) 2.5 MG 24 hr tablet Take 1 tablet (2.5 mg total) by mouth daily with breakfast. 07/18/22   Latrelle Dodrill, MD  lisinopril (ZESTRIL) 40 MG tablet Take 1 tablet (40 mg total) by mouth daily. 07/14/22   Latrelle Dodrill, MD  MAGNESIUM PO Taking 1 gummy by mouth daily    [provider]  metFORMIN (GLUCOPHAGE) 1000 MG tablet Take 1 tablet (1,000 mg total) by mouth 2 (two) times daily with a meal.  07/14/22   Latrelle Dodrill, MD  Multiple Vitamins-Minerals (MULTIVITAMIN GUMMIES ADULT PO) Taking 1 gummy by mouth several times a week    [provider]  rosuvastatin (CRESTOR) 5 MG tablet Take 1 tablet (5 mg total) by mouth daily. 08/30/22   Hilty, Lisette Abu, MD  VITAMIN D PO Take by mouth.    [provider]      Allergies    Statins    Review of Systems   Review of Systems  Neurological:  Positive for headaches.    Physical Exam Updated Vital Signs BP (!) 150/80 (BP Location: Left Arm)   Pulse 72   Temp 97.9 F (36.6 C) (Oral)   Resp 16   Ht 5\' 9"  (1.753 m)   Wt 88.5 kg   SpO2 97%   BMI 28.80 kg/m  Physical Exam Vitals reviewed.  HENT:     Right Ear: Tympanic membrane and ear canal normal.     Left Ear: Tympanic membrane and ear canal normal.  Eyes:     Extraocular Movements: Extraocular movements intact.     Pupils: Pupils are equal, round, and reactive to light.  Skin:    Findings: No rash.  Neurological:     Mental Status: She is oriented to person, place, and time.  Cranial Nerves: No cranial nerve deficit or facial asymmetry.     Sensory: No sensory deficit.     Motor: No weakness.     Gait: Gait normal.     ED Results / Procedures / Treatments   Labs (all labs ordered are listed, but only abnormal results are displayed) Labs Reviewed  CBC WITH DIFFERENTIAL/PLATELET  SEDIMENTATION RATE  BASIC METABOLIC PANEL    EKG None  Radiology No results found.  Procedures Procedures    Medications Ordered in ED Medications  ketorolac (TORADOL) 15 MG/ML injection 10 mg (has no administration in time range)  metoCLOPramide (REGLAN) injection 5 mg (has no administration in time range)  tetracaine (PONTOCAINE) 0.5 % ophthalmic solution 2 drop (2 drops Left Eye Given by Other 11/26/22 1554)  fluorescein ophthalmic strip 1 strip (1 strip Left Eye Given 11/26/22 1554)    ED Course/ Medical Decision Making/ A&P                                  Medical Decision Making This is 77 year old female here today with left-sided facial pain.  Differential diagnoses include shingles, Ramsay Hunt, giant cell arteritis, temporal arteritis, less likely intracranial hemorrhage.  Plan-patient does have sensitivity to light palpation, she does describe "electricity" sensations when doing this.  She does not have any tenderness over temporal artery.  Will obtain basic blood work, including sed rate.  Will stain the patient's eye.  Vision grossly normal.  Reassessment-7 Joslyn Devon rate is a send out.  Provided the patient to my list.  Will call her if positive.  Headache improved.  Amount and/or Complexity of Data Reviewed Labs: ordered.  Risk Prescription drug management.           Final Clinical Impression(s) / ED Diagnoses Final diagnoses:  Facial pain    Rx / DC Orders ED Discharge Orders          Ordered    valACYclovir (VALTREX) 1000 MG tablet  3 times daily        11/26/22 1733              Anders Simmonds T, DO 11/26/22 1736

## 2022-11-26 NOTE — ED Triage Notes (Signed)
Patient presents to ED via POV from home. Here with headache and left eye pain. Expresses concern for shingles. No obvious rash or redness noted.

## 2022-11-26 NOTE — Discharge Instructions (Addendum)
I have prescribed you valacyclovir that you can take 3 times per day for the next 1 week.  Your sedimentation rate lab will likely not result until much later this evening.  I will call you if that result is positive and send you in steroids.  Please follow-up your primary care doctor this week.

## 2022-11-26 NOTE — Telephone Encounter (Signed)
After Hours Call  Received after-hours call. Thinks she has shingles. - Feels bumps under skin right above L eye. Touching it feels like shooting electricity pain. Has not had shingles vaccine. - Husband has had shingles before and she thinks this is similar - L eye watering a lot. Having pain in L eye - Has HA for past 2 days, but tylenol helping. L sided. - No fevers, vision changes.  - Plans to go Urgent Care clinic today for further evaluation  A/P: Differential includes shingles, septal cellulitis, migraine, GCA, SAH, stroke, cluster HA. - Pt will go to UC for further evaluation. Depending on their evaluation, advised that she may be directed to ED.  - Discussed ED precautions including vision changes, intractable HA, N/V. - Advised to f/u with PCP this upcoming week. Consider shingrix vaccine.

## 2022-11-30 DIAGNOSIS — E119 Type 2 diabetes mellitus without complications: Secondary | ICD-10-CM | POA: Diagnosis not present

## 2022-11-30 DIAGNOSIS — Z9889 Other specified postprocedural states: Secondary | ICD-10-CM | POA: Diagnosis not present

## 2022-11-30 DIAGNOSIS — B0239 Other herpes zoster eye disease: Secondary | ICD-10-CM | POA: Diagnosis not present

## 2022-11-30 DIAGNOSIS — Z961 Presence of intraocular lens: Secondary | ICD-10-CM | POA: Diagnosis not present

## 2022-11-30 DIAGNOSIS — H40003 Preglaucoma, unspecified, bilateral: Secondary | ICD-10-CM | POA: Diagnosis not present

## 2022-12-02 ENCOUNTER — Telehealth: Payer: Self-pay

## 2022-12-02 NOTE — Telephone Encounter (Signed)
Patient calls nurse line regarding recent ED visit. She reports that she was diagnosed with shingles on 9/14. She reports facial lesions that are very close to her eye. She has been in contact with eye doctor and they are closely following.   She wanted to make provider aware that she is also having left sided ear pain. Advised that it is difficult to know if ear pain is related to shingles without being able to evaluate her.   Patient is going to call back on Monday to follow up on concern. If pain is persistent, she will schedule appointment.   ED precautions and supportive measures discussed for over the weekend.

## 2022-12-02 NOTE — Telephone Encounter (Signed)
Agree with being seen in person for eval if still having pain Latrelle Dodrill, MD

## 2022-12-06 NOTE — Telephone Encounter (Signed)
Called patient back. She reports that she is continuing to have ear pain.   She spoke with her eye doctor over the weekend, that advised this could be related to shingles.   Patient requested to schedule follow up for shingles and to have her A1c checked. Husband has appointment next week and would like to come in at same time. PCP not available. Patient is okay with scheduling with another provider. Scheduled with Dr. Threasa Beards on 12/14/22.  Advised that if pain were to worsen or be accompanied by fever, lethargy, unable to eat or drink, she should be seen sooner.   Patient verbalizes understanding.   Veronda Prude, RN

## 2022-12-09 DIAGNOSIS — E119 Type 2 diabetes mellitus without complications: Secondary | ICD-10-CM | POA: Diagnosis not present

## 2022-12-09 DIAGNOSIS — Z961 Presence of intraocular lens: Secondary | ICD-10-CM | POA: Diagnosis not present

## 2022-12-09 DIAGNOSIS — B0239 Other herpes zoster eye disease: Secondary | ICD-10-CM | POA: Diagnosis not present

## 2022-12-09 DIAGNOSIS — H40003 Preglaucoma, unspecified, bilateral: Secondary | ICD-10-CM | POA: Diagnosis not present

## 2022-12-12 DIAGNOSIS — G4733 Obstructive sleep apnea (adult) (pediatric): Secondary | ICD-10-CM | POA: Diagnosis not present

## 2022-12-14 ENCOUNTER — Ambulatory Visit: Payer: Medicare Other | Admitting: Family Medicine

## 2022-12-14 ENCOUNTER — Encounter: Payer: Self-pay | Admitting: Family Medicine

## 2022-12-14 VITALS — BP 119/71 | HR 76 | Ht 69.0 in | Wt 195.0 lb

## 2022-12-14 DIAGNOSIS — Z7984 Long term (current) use of oral hypoglycemic drugs: Secondary | ICD-10-CM | POA: Diagnosis not present

## 2022-12-14 DIAGNOSIS — B0223 Postherpetic polyneuropathy: Secondary | ICD-10-CM | POA: Diagnosis not present

## 2022-12-14 DIAGNOSIS — E119 Type 2 diabetes mellitus without complications: Secondary | ICD-10-CM

## 2022-12-14 LAB — POCT GLYCOSYLATED HEMOGLOBIN (HGB A1C): HbA1c, POC (controlled diabetic range): 7.6 % — AB (ref 0.0–7.0)

## 2022-12-14 MED ORDER — BEMPEDOIC ACID 180 MG PO TABS: 180.0000 mg | ORAL_TABLET | Freq: Every day | ORAL | 0 refills | Status: DC

## 2022-12-14 MED ORDER — BEMPEDOIC ACID 180 MG PO TABS: 180.0000 mg | ORAL_TABLET | Freq: Every day | ORAL | 3 refills | Status: DC

## 2022-12-14 NOTE — Patient Instructions (Addendum)
Thank you for visiting clinic today - it is always our pleasure to care for you.  Today we discussed your shingles symptoms, diabetes, and cholesterol.   For your shingles symptoms, please continue your medication as prescribed by your eye doctor until you see them again next week. You may use tylenol or other comforting measures as needed for symptom management.   Your A1c today was 7.6, which is around the goal for you. Please continue your medications as prescribed and keep up the great work with healthy diet and exercise. We will check in again in 3 months and make any adjustments as needed.  For your cholesterol, please stop taking the Crestor given your painful symptoms. We have instead started a new medication called Bempedoic Acid. If your experience unwanted side effects from this, please let us know.   Please schedule an appointment in 3 months with your PCP for follow up on the above.   Reach out any time with any questions or concerns you may have - we are here for you!  Tiffany Quale, MD [facility] [phone]

## 2022-12-14 NOTE — Progress Notes (Unsigned)
    SUBJECTIVE:   CHIEF COMPLAINT / HPI:   Shingles?   urgent care on 16th, Saw eye doc on the 18th  Sudden terrible headache - left head pain going into eye  Sore nerve pain, tender to touch  Cannot use cpap with shingles  Saw urgent care - given valtrex and 3 steroid shots ?  No previous shingles / episode like this  Eye was blurry and watery - now resolved  Urgen care started valtrex, eye continued  Tapered to 1 valtrex a day   Headaches - tylenol couple times a week   No fever, sick symptoms   Check in about form for cpap   DM A1c 6.9 in May 2024 Does not check at home Saw eye doc for diabetic exam earlier this year  Glipizide 2.5 at night, metformin 1000 BID  HLD Statin gives bad calf cramps, takes every MWF Saw ED Never blood clots Last lipid panel normal in April 2024   PERTINENT  PMH / PSH: ***  OBJECTIVE:   BP 119/71   Pulse 76   Ht 5\' 9"  (1.753 m)   Wt 195 lb (88.5 kg)   SpO2 96%   BMI 28.80 kg/m   ***  ASSESSMENT/PLAN:   No problem-specific Assessment & Plan notes found for this encounter.   Shingles  Consider gabapentin Consider shinggrinx   Ivery Quale, MD [facility]

## 2022-12-15 ENCOUNTER — Other Ambulatory Visit (HOSPITAL_COMMUNITY): Payer: Self-pay

## 2022-12-16 ENCOUNTER — Telehealth: Payer: Self-pay | Admitting: Family Medicine

## 2022-12-16 NOTE — Telephone Encounter (Signed)
Letter printed, will place at front desk, please inform patient  Tiffany Dodrill, MD

## 2022-12-16 NOTE — Telephone Encounter (Signed)
Hey! I just want to make sure you do not have this form, if not I can call patient to see if she can bring in another for you to complete. Penni Bombard CMA

## 2022-12-16 NOTE — Telephone Encounter (Signed)
Patient has been informed and she will pick it up today. Penni Bombard CMA

## 2022-12-16 NOTE — Telephone Encounter (Signed)
Spoke with patient she explained that [employer] cannot locate the form either, she is requesting that you can type a note saying that she cannot wear her C-pap due to having shingles in her eye. Patient can pick up the letter once it is done if you decided to complete the note. Penni Bombard CMA

## 2022-12-16 NOTE — Telephone Encounter (Signed)
On Oct. 2, 2024 patient said [employer] faxed over a form last week that she needs to have completed today to take to their office.  Reached out to SW front office who collects the faxes and no form was received, Advised form could have been placed in PCP's box.  Messaged PCP who reports she does not have the form.

## 2022-12-19 ENCOUNTER — Telehealth: Payer: Self-pay

## 2022-12-19 NOTE — Telephone Encounter (Signed)
Pharmacy Patient Advocate Encounter   Received notification from CoverMyMeds that prior authorization for NEXLETOL 180MG  TABS is required/requested.   PA required; PA submitted to [facility] via CoverMyMeds Key/confirmation #/EOC [id number]. Status is pending

## 2022-12-22 NOTE — Telephone Encounter (Signed)
Faxed additional information requested by insurance

## 2023-01-02 DIAGNOSIS — G4733 Obstructive sleep apnea (adult) (pediatric): Secondary | ICD-10-CM | POA: Diagnosis not present

## 2023-01-11 DIAGNOSIS — G4733 Obstructive sleep apnea (adult) (pediatric): Secondary | ICD-10-CM | POA: Diagnosis not present

## 2023-01-17 DIAGNOSIS — Z1231 Encounter for screening mammogram for malignant neoplasm of breast: Secondary | ICD-10-CM | POA: Diagnosis not present

## 2023-01-17 LAB — HM MAMMOGRAPHY

## 2023-01-18 ENCOUNTER — Other Ambulatory Visit: Payer: Self-pay | Admitting: Family Medicine

## 2023-01-24 ENCOUNTER — Ambulatory Visit (INDEPENDENT_AMBULATORY_CARE_PROVIDER_SITE_OTHER): Payer: Medicare Other

## 2023-01-24 DIAGNOSIS — Z23 Encounter for immunization: Secondary | ICD-10-CM

## 2023-01-26 NOTE — Progress Notes (Signed)
Patient presents to nurse clinic for flu vaccination. Administered in LD, site unremarkable, tolerated injection well.   Cordarrel Stiefel C Arek Spadafore, RN   

## 2023-01-27 DIAGNOSIS — Z1283 Encounter for screening for malignant neoplasm of skin: Secondary | ICD-10-CM | POA: Diagnosis not present

## 2023-01-27 DIAGNOSIS — Z08 Encounter for follow-up examination after completed treatment for malignant neoplasm: Secondary | ICD-10-CM | POA: Diagnosis not present

## 2023-01-27 DIAGNOSIS — Z85828 Personal history of other malignant neoplasm of skin: Secondary | ICD-10-CM | POA: Diagnosis not present

## 2023-01-27 DIAGNOSIS — L820 Inflamed seborrheic keratosis: Secondary | ICD-10-CM | POA: Diagnosis not present

## 2023-01-27 DIAGNOSIS — D225 Melanocytic nevi of trunk: Secondary | ICD-10-CM | POA: Diagnosis not present

## 2023-02-11 DIAGNOSIS — G4733 Obstructive sleep apnea (adult) (pediatric): Secondary | ICD-10-CM | POA: Diagnosis not present

## 2023-02-15 DIAGNOSIS — E785 Hyperlipidemia, unspecified: Secondary | ICD-10-CM | POA: Diagnosis not present

## 2023-02-16 LAB — LIPID PANEL
Chol/HDL Ratio: 2.2 {ratio} (ref 0.0–4.4)
Cholesterol, Total: 140 mg/dL (ref 100–199)
HDL: 64 mg/dL (ref >39)
LDL Chol Calc (NIH): 58 mg/dL (ref 0–99)
Triglycerides: 95 mg/dL (ref 0–149)
VLDL Cholesterol Cal: 18 mg/dL (ref 5–40)

## 2023-02-16 LAB — LIPOPROTEIN A (LPA): Lipoprotein (a): 69.6 nmol/L (ref <75.0)

## 2023-02-21 ENCOUNTER — Encounter (HOSPITAL_BASED_OUTPATIENT_CLINIC_OR_DEPARTMENT_OTHER): Payer: Self-pay | Admitting: Internal Medicine

## 2023-02-21 ENCOUNTER — Ambulatory Visit (HOSPITAL_BASED_OUTPATIENT_CLINIC_OR_DEPARTMENT_OTHER): Payer: Medicare Other | Admitting: Internal Medicine

## 2023-02-21 VITALS — BP 136/72 | HR 68 | Wt 199.0 lb

## 2023-02-21 DIAGNOSIS — D6869 Other thrombophilia: Secondary | ICD-10-CM

## 2023-02-21 DIAGNOSIS — E785 Hyperlipidemia, unspecified: Secondary | ICD-10-CM

## 2023-02-21 DIAGNOSIS — T466X5D Adverse effect of antihyperlipidemic and antiarteriosclerotic drugs, subsequent encounter: Secondary | ICD-10-CM

## 2023-02-21 DIAGNOSIS — I2510 Atherosclerotic heart disease of native coronary artery without angina pectoris: Secondary | ICD-10-CM | POA: Diagnosis not present

## 2023-02-21 DIAGNOSIS — M791 Myalgia, unspecified site: Secondary | ICD-10-CM

## 2023-02-21 DIAGNOSIS — I480 Paroxysmal atrial fibrillation: Secondary | ICD-10-CM

## 2023-02-21 DIAGNOSIS — T466X5A Adverse effect of antihyperlipidemic and antiarteriosclerotic drugs, initial encounter: Secondary | ICD-10-CM

## 2023-02-21 NOTE — Patient Instructions (Signed)
Medication Instructions:  NO CHANGES  *If you need a refill on your cardiac medications before your next appointment, please call your pharmacy*   Lab Work: FASTING lab work in 1 year    Follow-Up: At [facility], you and your health needs are our priority.  As part of our continuing mission to provide you with exceptional heart care, we have created designated Provider Care Teams.  These Care Teams include your primary Cardiologist (physician) and Advanced Practice Providers (APPs -  Physician Assistants and Nurse Practitioners) who all work together to provide you with the care you need, when you need it.  We recommend signing up for the patient portal called "MyChart".  Sign up information is provided on this After Visit Summary.  MyChart is used to connect with patients for Virtual Visits (Telemedicine).  Patients are able to view lab/test results, encounter notes, upcoming appointments, etc.  Non-urgent messages can be sent to your provider as well.   To learn more about what you can do with MyChart, go to [email or web address].    Your next appointment:    12 months with Dr. Rennis Golden

## 2023-02-21 NOTE — Progress Notes (Addendum)
LIPID CLINIC CONSULT NOTE  Chief Complaint:  Manage dyslipidemia  Primary Care Physician: Latrelle Dodrill, MD  Primary Cardiologist:  None  HPI:  Tiffany Becker is a 77 y.o. female who is being seen today for the evaluation of dyslipidemia at the request of Pollie Meyer Estevan Ryder, MD.  This is a pleasant 77 year old female kindly referred for evaluation of dyslipidemia.  She has a history of high cholesterol with intolerance to simvastatin and more recently a atorvastatin due to to causing symptoms of leg pain and hair loss.  She was also noted to have recent vitamin D deficiency with a level of about 24 on repletion.  She has a family history of heart disease in her father who had an MI at 8.  Recent lipids in December showed total cholesterol of 197, triglycerides 106, HDL 63 and LDL 115.  She was previously taking atorvastatin 40 mg every Monday Wednesday and Friday and decrease the dose to 20 mg 3 times a week but still had side effects.  She feels that she eats a fairly healthy diet but has been actively trying to reduce saturated fats.  10/28/2020  Tiffany Becker returns today for follow-up.  She has had significant improvement in her lipids.  Total cholesterol now 136, HDL 58, triglycerides 74 and LDL 63.  This apparently is on predominantly 5 mg of rosuvastatin.  She was also approved for Repatha however was concerned about side effects of the medication.  Although she did get the prescription filled and qualified for a health well grant for which she got $2500 to use for the year, at this point he does not appear that she needs the medication.  She is also made some dietary changes.  She has reached target LDL less than 70.  05/14/2021  Tiffany Becker is seen today for follow-up.  Overall she seems to be doing well.  She denies any chest pain or worsening shortness of breath.  She is tolerating low-dose rosuvastatin and has some infrequent leg discomfort.  That being said, her lipids  remain low with LDL now at 66, total cholesterol 148, HDL 68 and Trigs 71.  08/30/2022  Tiffany Becker returns today for follow-up.  Her cholesterol has trended up somewhat.  Recent lipids in April showed total 163, HDL 64, triglycerides 112 and LDL 79.  After this it was noted that she was started on bempedoic acid by her primary care provider.  She says she has been taking this medicine for more than a month but recently has been having significant myalgias which she says she attributes to the medication.  She was considering stopping it but wanted to discuss it with me.  On top of that, she recently was diagnosed with atrial fibrillation.  She says she has had intermittent palpitations for a long time but eventually was finally noted to have PAF and has been anticoagulated on Eliquis.  She said at home her heart rate got up to around 200 and she presented to the ER by EMS with heart rate control on diltiazem.  She also has some breakthrough diltiazem as needed and carvedilol for rate control.  She has noted with the carvedilol and amlodipine that her blood pressure sometimes gets low and therefore she breaks her amlodipine in half and takes 5 mg twice a day.  On this regimen she has done well.  02/21/2023  Tiffany Becker returns today for follow-up.  She had recent repeat lipids which show good control of her LDL.  Total cholesterol 140, triglycerides 95, HDL 64 and LDL 58.  She had a normal LP(a) at 69.6 nmol/L.  She remains on low-dose rosuvastatin which she is tolerating well.  PMHx:  Past Medical History:  Diagnosis Date   Abnormal renal ultrasound 04/25/2002   Diabetes mellitus without complication (HCC)    Diverticulosis of colon 08/12/2004   GERD (gastroesophageal reflux disease)    H pylori neg 08/21/02   Hypertension    Peptic ulcer disease 03/14/1980   EGD   Primary osteoarthritis of both hips 06/13/2007   Dr Turner Daniels   Pyelonephritis 01/12/2001   Vitreous detachment     Past Surgical  History:  Procedure Laterality Date   BREAST REDUCTION SURGERY  03/14/93   BUNIONECTOMY  12/09   L with 2nd and 5th toe straightening   CERVICAL DISCECTOMY  03/14/06   C6-7   CHOLECYSTECTOMY  01/12/89   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   SALPINGOOPHORECTOMY  09/11/97   ruptured cyst   TONSILLECTOMY  03/14/52   TOTAL ABDOMINAL HYSTERECTOMY  01/12/89   L salpingoophorectomy for ruptured cyst   TUBAL LIGATION  03/14/72   UMBILICAL HERNIA REPAIR  03/14/64    FAMHx:  Family History  Problem Relation Age of Onset   COPD Mother    Osteoarthritis Mother    Asthma Mother    Heart disease Mother    Coronary artery disease Father 51       died age 49   Heart disease Father    Stroke Father    Diabetes Brother        bladder, pacemaker, Hemochromatosis   Cancer Brother 37       Bladder    SOCHx:   reports that she has never smoked. She has never been exposed to tobacco smoke. She has never used smokeless tobacco. She reports that she does not drink alcohol and does not use drugs.  ALLERGIES:  Allergies  Allergen Reactions   Statins Other (See Comments)    Simvastatin: Hair loss and leg pain Atorvastatin: Hair Loss Pravasatin: Myalgia Rosuvastatin: High doses lead to myalgia/joint pain    ROS: Pertinent items noted in HPI and remainder of comprehensive ROS otherwise negative.  HOME MEDS: Current Outpatient Medications on File Prior to Visit  Medication Sig Dispense Refill   amLODipine (NORVASC) 5 MG tablet Take 1 tablet (5 mg total) by mouth in the morning and at bedtime. 180 tablet 3   apixaban (ELIQUIS) 5 MG TABS tablet Take 1 tablet (5 mg total) by mouth 2 (two) times daily. 180 tablet 1   Bempedoic Acid (NEXLETOL) 180 MG TABS TAKE 1 TABLET BY MOUTH DAILY 30 tablet 1   carvedilol (COREG) 3.125 MG tablet Take 1 tablet (3.125 mg total) by mouth 2 (two) times daily with a meal. 180 tablet 3   diltiazem (CARDIZEM) 30 MG tablet Take 1 tablet (30 mg total) by mouth 3 (three) times daily  as needed (for heart rate >110). 60 tablet 2   glipiZIDE (GLUCOTROL XL) 2.5 MG 24 hr tablet Take 1 tablet (2.5 mg total) by mouth daily with breakfast. 90 tablet 3   lisinopril (ZESTRIL) 40 MG tablet Take 1 tablet (40 mg total) by mouth daily. 90 tablet 3   MAGNESIUM PO Taking 1 gummy by mouth daily     metFORMIN (GLUCOPHAGE) 1000 MG tablet Take 1 tablet (1,000 mg total) by mouth 2 (two) times daily with a meal. 200 tablet 2   Multiple Vitamins-Minerals (MULTIVITAMIN GUMMIES ADULT PO) Taking 1  gummy by mouth several times a week     rosuvastatin (CRESTOR) 5 MG tablet Take 5 mg by mouth daily.     VITAMIN D PO Take by mouth.     No current facility-administered medications on file prior to visit.    LABS/IMAGING: No results found for this or any previous visit (from the past 48 hour(s)). No results found.  LIPID PANEL:    Component Value Date/Time   CHOL 140 02/15/2023 1033   TRIG 95 02/15/2023 1033   HDL 64 02/15/2023 1033   CHOLHDL 2.2 02/15/2023 1033   CHOLHDL 3.0 10/20/2014 0941   VLDL 25 10/20/2014 0941   LDLCALC 58 02/15/2023 1033   LDLDIRECT 47 03/29/2021 1145    WEIGHTS: Wt Readings from Last 3 Encounters:  02/21/23 199 lb (90.3 kg)  12/14/22 195 lb (88.5 kg)  11/26/22 195 lb (88.5 kg)    VITALS: BP 136/72 (BP Location: Left Arm, Patient Position: Sitting, Cuff Size: Normal)   Pulse 68   Wt 199 lb (90.3 kg)   SpO2 96%   BMI 29.39 kg/m   EXAM: Deferred  EKG: Deferred  ASSESSMENT: Mixed dyslipidemia, goal LDL less than 70 Elevated CAC score of 724, 93rd percentile, aortic atherosclerosis DM2 Hypertension Family history of premature CAD in father Statin intolerance -myalgias PAF on Eliquis OSA on CPAP  PLAN: 1.   Tiffany Becker appears to have good control of her cholesterol which is now a target LDL less than 70.  She remains on Eliquis but has not had any issues with A-fib recently.  She has a follow-up in the A-fib clinic in March.  Blood pressure is  well-controlled today.  Overall it seems like she is doing pretty well.  She has been complaint with CPAP, except for during a recent episode of shingles and she could not use her CPAP for a short while, however, she has been 100% compliant since then. Plan follow-up with me annually or sooner as necessary.  Chrystie Nose, MD, [facility], FACP  [place]  [facility]  Medical Director of the Advanced Lipid Disorders &  Cardiovascular Risk Reduction Clinic Diplomate of the American Board of Clinical Lipidology Attending Cardiologist  Direct Dial: [phone]  Fax: [phone]  Website:  [email or web address] Nicely Hydie Langan 02/21/2023, 10:16 AM

## 2023-02-23 ENCOUNTER — Encounter: Payer: Self-pay | Admitting: Family Medicine

## 2023-03-13 DIAGNOSIS — G4733 Obstructive sleep apnea (adult) (pediatric): Secondary | ICD-10-CM | POA: Diagnosis not present

## 2023-03-28 ENCOUNTER — Other Ambulatory Visit: Payer: Self-pay | Admitting: *Deleted

## 2023-03-28 ENCOUNTER — Telehealth: Payer: Self-pay | Admitting: Internal Medicine

## 2023-03-28 DIAGNOSIS — I480 Paroxysmal atrial fibrillation: Secondary | ICD-10-CM

## 2023-03-28 MED ORDER — APIXABAN 5 MG PO TABS: 5.0000 mg | ORAL_TABLET | Freq: Two times a day (BID) | ORAL | 1 refills | Status: DC

## 2023-03-28 NOTE — Telephone Encounter (Signed)
 Eliquis 5mg  refill request received. Patient is 78 years old, weight-90.3kg, Crea-0.91 on 08/01/22, Diagnosis-Afib, and last seen by Dr. Rennis Golden on 02/21/23. Dose is appropriate based on dosing criteria. Will send in refill to requested pharmacy.

## 2023-03-28 NOTE — Telephone Encounter (Signed)
*  STAT* If patient is at the pharmacy, call can be transferred to refill team.   1. Which medications need to be refilled? (please list name of each medication and dose if known) Eliquis    2. Would you like to learn more about the convenience, safety, & potential cost savings by using the [facility]?    3. Are you open to using the [facility] (Type [facility]..   4. Which pharmacy/location (including street and city if local pharmacy) is medication to be sent to?[facility] [address] [place], [place]   5. Do they need a 30 day or 90 day supply? 90 days and refills

## 2023-04-05 ENCOUNTER — Telehealth: Payer: Self-pay | Admitting: Cardiovascular Disease

## 2023-04-05 ENCOUNTER — Other Ambulatory Visit: Payer: Self-pay | Admitting: Family Medicine

## 2023-04-05 ENCOUNTER — Other Ambulatory Visit: Payer: Self-pay

## 2023-04-05 DIAGNOSIS — I480 Paroxysmal atrial fibrillation: Secondary | ICD-10-CM

## 2023-04-05 MED ORDER — APIXABAN 5 MG PO TABS: 5.0000 mg | ORAL_TABLET | Freq: Two times a day (BID) | ORAL | 1 refills | Status: DC

## 2023-04-05 NOTE — Telephone Encounter (Signed)
Patient is requesting call back from someone in regards to a letter needed for insurance to have them pay for CPAP. Please advise.

## 2023-04-10 ENCOUNTER — Telehealth: Payer: Self-pay

## 2023-04-10 NOTE — Telephone Encounter (Signed)
Spoke with patient, Last OV notes sent to [employer] today 1/27. Patient will reach out if other documentation is needed.

## 2023-04-13 DIAGNOSIS — G4733 Obstructive sleep apnea (adult) (pediatric): Secondary | ICD-10-CM | POA: Diagnosis not present

## 2023-04-17 ENCOUNTER — Ambulatory Visit (INDEPENDENT_AMBULATORY_CARE_PROVIDER_SITE_OTHER): Payer: Medicare Other | Admitting: Family Medicine

## 2023-04-17 ENCOUNTER — Encounter: Payer: Self-pay | Admitting: Family Medicine

## 2023-04-17 VITALS — BP 136/76 | HR 77 | Ht 69.0 in | Wt 197.0 lb

## 2023-04-17 DIAGNOSIS — E1169 Type 2 diabetes mellitus with other specified complication: Secondary | ICD-10-CM

## 2023-04-17 DIAGNOSIS — I10 Essential (primary) hypertension: Secondary | ICD-10-CM | POA: Diagnosis not present

## 2023-04-17 DIAGNOSIS — G4733 Obstructive sleep apnea (adult) (pediatric): Secondary | ICD-10-CM | POA: Diagnosis not present

## 2023-04-17 DIAGNOSIS — E785 Hyperlipidemia, unspecified: Secondary | ICD-10-CM

## 2023-04-17 LAB — POCT GLYCOSYLATED HEMOGLOBIN (HGB A1C): HbA1c, POC (controlled diabetic range): 7.2 % — AB (ref 0.0–7.0)

## 2023-04-17 NOTE — Progress Notes (Unsigned)
  Date of Visit: 04/17/2023   SUBJECTIVE:   HPI:  Tiffany Becker presents today for routine follow up.  OSA - using CPAP every night. In need of new supplies. She is interested in [place] implantable OSA treatment if she qualifies. Has benefited from CPAP use  Diabetes - taking glipizide XL 2.5mg  daily and metformin 1000mg  twice daily, tolerating well  Hypertension - taking lisinopril 40mg  daily and amlodipine 5mg  daily. Tolerating well.   OBJECTIVE:   BP 136/76   Pulse 77   Ht 5\' 9"  (1.753 m)   Wt 197 lb (89.4 kg)   SpO2 96%   BMI 29.09 kg/m  Gen: no acute distress, pleasant cooperative HEENT: normocephalic, atraumatic  Heart: regular rate and rhythm, no murmur Lungs: clear to auscultation bilaterally, normal work of breathing  Neuro: alert speech normal, grossly nonfocal Ext: No appreciable lower extremity edema bilaterally   ASSESSMENT/PLAN:   Assessment & Plan  type 2 diabetes mellitus (HCC) complicated by dyslipidemia  A1c today stable at 7.2 Continue glipizide and metformin at present doses Check urine microalbumin today OSA (obstructive sleep apnea) Has been compliant with CPAP, using it nightly. She continues to benefit from CPAP. Interested in [facility] implant, desires to see sleep medicine Will refer to Dr. Murtis Sink of [facility] Essential hypertension, benign Well controlled. Continue current medication regimen.    FOLLOW UP: Follow up in 6 months with me for diabetes Referring to sleep medicine  Tiffany J. Pollie Meyer, MD [facility]

## 2023-04-17 NOTE — Patient Instructions (Signed)
It was great to see you again today.  Referring to Dr. Murtis Sink, sleep specialist with [facility] Continue CPAP Continue present diabetes medications   Follow up with me in 6 months, sooner if needed  Be well, Dr. Pollie Meyer

## 2023-04-18 ENCOUNTER — Telehealth: Payer: Self-pay

## 2023-04-18 LAB — MICROALBUMIN / CREATININE URINE RATIO
Creatinine, Urine: 262.5 mg/dL
Microalb/Creat Ratio: 51 mg/g{creat} — ABNORMAL HIGH (ref 0–29)
Microalbumin, Urine: 134.8 ug/mL

## 2023-04-18 NOTE — Telephone Encounter (Signed)
 Patient calls nurse line in regards to Cpap.   She reports she was contacted by [facility] and advised the documentation that was submitted is not sufficient.   She reports the letter needs to state: 1) The patient has been compliant with use  2) The patient has been using nightly  3) The Cpap has been beneficial to patient    Advised will forward to PCP.

## 2023-04-19 DIAGNOSIS — G4733 Obstructive sleep apnea (adult) (pediatric): Secondary | ICD-10-CM | POA: Insufficient documentation

## 2023-04-24 ENCOUNTER — Other Ambulatory Visit: Payer: Self-pay

## 2023-04-24 ENCOUNTER — Encounter (HOSPITAL_BASED_OUTPATIENT_CLINIC_OR_DEPARTMENT_OTHER): Payer: Self-pay | Admitting: Emergency Medicine

## 2023-04-24 ENCOUNTER — Emergency Department (HOSPITAL_BASED_OUTPATIENT_CLINIC_OR_DEPARTMENT_OTHER): Payer: Medicare Other

## 2023-04-24 ENCOUNTER — Emergency Department (HOSPITAL_BASED_OUTPATIENT_CLINIC_OR_DEPARTMENT_OTHER)
Admission: EM | Admit: 2023-04-24 | Discharge: 2023-04-24 | Disposition: A | Discharge status: Home / Self Care | Payer: Medicare Other | Attending: Emergency Medicine | Admitting: Emergency Medicine

## 2023-04-24 DIAGNOSIS — R002 Palpitations: Secondary | ICD-10-CM | POA: Diagnosis present

## 2023-04-24 DIAGNOSIS — Z7901 Long term (current) use of anticoagulants: Secondary | ICD-10-CM | POA: Insufficient documentation

## 2023-04-24 DIAGNOSIS — I10 Essential (primary) hypertension: Secondary | ICD-10-CM | POA: Diagnosis not present

## 2023-04-24 DIAGNOSIS — Z79899 Other long term (current) drug therapy: Secondary | ICD-10-CM | POA: Insufficient documentation

## 2023-04-24 DIAGNOSIS — I4891 Unspecified atrial fibrillation: Secondary | ICD-10-CM | POA: Diagnosis not present

## 2023-04-24 DIAGNOSIS — Z7984 Long term (current) use of oral hypoglycemic drugs: Secondary | ICD-10-CM | POA: Insufficient documentation

## 2023-04-24 DIAGNOSIS — E119 Type 2 diabetes mellitus without complications: Secondary | ICD-10-CM | POA: Insufficient documentation

## 2023-04-24 LAB — CBC
HCT: 41.2 % (ref 36.0–46.0)
Hemoglobin: 14 g/dL (ref 12.0–15.0)
MCH: 30.7 pg (ref 26.0–34.0)
MCHC: 34 g/dL (ref 30.0–36.0)
MCV: 90.4 fL (ref 80.0–100.0)
Platelets: 250 10*3/uL (ref 150–400)
RBC: 4.56 MIL/uL (ref 3.87–5.11)
RDW: 12.6 % (ref 11.5–15.5)
WBC: 7.9 10*3/uL (ref 4.0–10.5)
nRBC: 0 % (ref 0.0–0.2)

## 2023-04-24 LAB — BASIC METABOLIC PANEL
Anion gap: 11 (ref 5–15)
BUN: 17 mg/dL (ref 8–23)
CO2: 23 mmol/L (ref 22–32)
Calcium: 9.5 mg/dL (ref 8.9–10.3)
Chloride: 99 mmol/L (ref 98–111)
Creatinine, Ser: 0.99 mg/dL (ref 0.44–1.00)
GFR, Estimated: 59 mL/min — ABNORMAL LOW (ref >60)
Glucose, Bld: 214 mg/dL — ABNORMAL HIGH (ref 70–99)
Potassium: 4.5 mmol/L (ref 3.5–5.1)
Sodium: 133 mmol/L — ABNORMAL LOW (ref 135–145)

## 2023-04-24 LAB — TSH: TSH: 0.405 u[IU]/mL (ref 0.350–4.500)

## 2023-04-24 MED ORDER — CARVEDILOL 6.25 MG PO TABS
3.1250 mg | ORAL_TABLET | Freq: Two times a day (BID) | ORAL | Status: DC
  Administered 2023-04-24: 3.125 mg via ORAL
  Filled 2023-04-24: qty 1

## 2023-04-24 NOTE — Discharge Instructions (Signed)
 Continue your medications as prescribed.  You should call your doctor and cardiologist tomorrow to schedule follow-up.  If you develop chest pain, shortness of breath or fast heart rate that is not responding to your medications you should return to the ED.

## 2023-04-24 NOTE — ED Triage Notes (Signed)
 Pt POV steady gait- reports she is 'in a-fib.'  Compliant with medications. Reports feeling palpitations appx 40 min PTA.   C/o L shoulder pain

## 2023-04-24 NOTE — ED Provider Notes (Signed)
 [facility] Provider Note   CSN: [id number] Arrival date & time: 04/24/23  1944     History  Chief Complaint  Patient presents with   Palpitations    Tiffany Becker is a 78 y.o. female.   Palpitations 78 year old female history of GERD, diabetes, hypertension, atrial fibrillation on Eliquis  presenting for palpitations.  About an hour prior to arrival she developed palpitations and could tell she went back into A-fib.  She took her 30 mg diltiazem  dose which she is supposed to take when she has A-fib with RVR.  She is already on Coreg  as well which has not taken tonight.  She feels palpitations but no chest pain, shortness of breath, dizziness, lightheadedness.     Home Medications Prior to Admission medications   Medication Sig Start Date End Date Taking? Authorizing Provider  amLODipine  (NORVASC ) 5 MG tablet Take 1 tablet (5 mg total) by mouth in the morning and at bedtime. 10/21/22   Candee Cha, MD  apixaban  (ELIQUIS ) 5 MG TABS tablet Take 1 tablet (5 mg total) by mouth 2 (two) times daily. 04/05/23   Hilty, Aviva Lemmings, MD  carvedilol  (COREG ) 3.125 MG tablet Take 1 tablet (3.125 mg total) by mouth 2 (two) times daily with a meal. 08/16/22   Limmie Ren, MD  diltiazem  (CARDIZEM ) 30 MG tablet Take 1 tablet (30 mg total) by mouth 3 (three) times daily as needed (for heart rate >110). 08/09/22   Nathanel Bal, PA-C  glipiZIDE  (GLUCOTROL  XL) 2.5 MG 24 hr tablet TAKE 1 TABLET BY MOUTH DAILY  WITH BREAKFAST 04/05/23   Candee Cha, MD  lisinopril  (ZESTRIL ) 40 MG tablet TAKE 1 TABLET BY MOUTH DAILY 04/05/23   McIntyre, Brittany J, MD  MAGNESIUM PO Taking 1 gummy by mouth daily    [provider]  metFORMIN  (GLUCOPHAGE ) 1000 MG tablet TAKE 1 TABLET BY MOUTH TWICE  DAILY WITH MEALS 04/05/23   McIntyre, Brittany J, MD  Multiple Vitamins-Minerals (MULTIVITAMIN GUMMIES ADULT PO) Taking 1 gummy by mouth several times a  week    [provider]  rosuvastatin  (CRESTOR ) 5 MG tablet Take 5 mg by mouth daily. 01/26/23   [provider]  VITAMIN D  PO Take by mouth.    [provider]      Allergies    Statins    Review of Systems   Review of Systems  Cardiovascular:  Positive for palpitations.  Review of systems completed and notable as per HPI.  ROS otherwise negative.   Physical Exam Updated Vital Signs BP 130/64   Pulse 61   Temp 97.8 F (36.6 C)   Resp 14   Ht 5\' 9"  (1.753 m)   Wt 88.5 kg   SpO2 96%   BMI 28.80 kg/m  Physical Exam Vitals and nursing note reviewed.  Constitutional:      General: She is not in acute distress.    Appearance: She is well-developed.  HENT:     Head: Normocephalic and atraumatic.  Eyes:     Extraocular Movements: Extraocular movements intact.     Conjunctiva/sclera: Conjunctivae normal.     Pupils: Pupils are equal, round, and reactive to light.  Cardiovascular:     Rate and Rhythm: Normal rate and regular rhythm.     Pulses: Normal pulses.     Heart sounds: Normal heart sounds. No murmur heard. Pulmonary:     Effort: Pulmonary effort is normal. No respiratory distress.  Breath sounds: Normal breath sounds.  Abdominal:     Palpations: Abdomen is soft.     Tenderness: There is no abdominal tenderness.  Musculoskeletal:        General: No swelling.     Cervical back: Neck supple.     Right lower leg: No edema.     Left lower leg: No edema.  Skin:    General: Skin is warm and dry.     Capillary Refill: Capillary refill takes less than 2 seconds.  Neurological:     Mental Status: She is alert.  Psychiatric:        Mood and Affect: Mood normal.     ED Results / Procedures / Treatments   Labs (all labs ordered are listed, but only abnormal results are displayed) Labs Reviewed  BASIC METABOLIC PANEL - Abnormal; Notable for the following components:      Result Value   Sodium 133 (*)    Glucose, Bld 214 (*)    GFR,  Estimated 59 (*)    All other components within normal limits  CBC  TSH    EKG EKG Interpretation Date/Time:  Monday April 24 2023 19:54:28 EST Ventricular Rate:  140 PR Interval:    QRS Duration:  74 QT Interval:  279 QTC Calculation: 426 R Axis:   58  Text Interpretation: Atrial fibrillation ST depression, probably rate related Confirmed by Rueben Cote [phone]) on 04/24/2023 8:16:24 PM  Radiology DG Chest Port 1 View Result Date: 04/24/2023 CLINICAL DATA:  Atrial fibrillation. EXAM: PORTABLE CHEST 1 VIEW COMPARISON:  Aug 01, 2022 FINDINGS: The heart size and mediastinal contours are within normal limits. There is moderate severity calcification of the aortic arch. Both lungs are clear. A radiopaque fusion plate and screws are seen overlying the lower cervical spine with multilevel degenerative changes noted throughout the thoracic spine. IMPRESSION: No active cardiopulmonary disease. Electronically Signed   By: Virgle Grime M.D.   On: 04/24/2023 21:08    Procedures Procedures    Medications Ordered in ED Medications  carvedilol  (COREG ) tablet 3.125 mg (3.125 mg Oral Given 04/24/23 2058)    ED Course/ Medical Decision Making/ A&P Clinical Course as of 04/24/23 2255  Mon Apr 24, 2023  2239 Patient remains in normal sinus rhythm.  She is asymptomatic.  Labs and x-ray are reassuring.  I recommend she call her cardiologist and PCP tomorrow for follow-up.  Return precautions given. [JD]    Clinical Course User Index [JD] Coleman Daughters, MD                                 Medical Decision Making Amount and/or Complexity of Data Reviewed Labs: ordered. Radiology: ordered.  Risk Prescription drug management.   Medical Decision Making:   Tiffany Becker is a 78 y.o. female who presented to the ED today with A-fib with RVR.  Initial EKG shows atrial fibrillation with RVR.  She has no chest pain or ischemic symptoms.  She already took her home diltiazem .  When I  saw her she had already converted to normal sinus rhythm.  She is asymptomatic.  Repeat EKG shows normal sinus rhythm.   Patient placed on continuous vitals and telemetry monitoring while in ED which was reviewed periodically.  Reviewed and confirmed nursing documentation for past medical history, family history, social history.  Reassessment and Plan:   Chest x-ray, lab work overall reassuring.  Shows some mild  hyponatremia improved from prior.  She has not had any chest pain, shortness of breath, lower concern for ACS, PE.  TSH sent and although pending.  I suspect she has recurrent A-fib given her history.  She was given her home Coreg .  She gets bradycardic with increased doses so will not adjust her medications.  She remained in normal sinus rhythm during observation in the ED and asymptomatic.  I think she can safely follow-up with her cardiologist and PCP.  She is comfortable this plan.  Return precautions given.   Patient's presentation is most consistent with acute complicated illness / injury requiring diagnostic workup.           Final Clinical Impression(s) / ED Diagnoses Final diagnoses:  Atrial fibrillation with RVR [facility])    Rx / DC Orders ED Discharge Orders     None         Coleman Daughters, MD 04/24/23 2255

## 2023-04-24 NOTE — ED Notes (Signed)
 Pt has now converted to SR, pt states she feels a lot better VSS

## 2023-04-25 NOTE — Telephone Encounter (Signed)
Apologies for delayed reply - this was documented in visit from last week Can you see if [employer] needs anything else?  Thanks Latrelle Dodrill, MD

## 2023-05-02 DIAGNOSIS — I480 Paroxysmal atrial fibrillation: Secondary | ICD-10-CM | POA: Diagnosis not present

## 2023-05-02 DIAGNOSIS — E119 Type 2 diabetes mellitus without complications: Secondary | ICD-10-CM | POA: Diagnosis not present

## 2023-05-02 DIAGNOSIS — D6869 Other thrombophilia: Secondary | ICD-10-CM | POA: Diagnosis not present

## 2023-05-02 DIAGNOSIS — I10 Essential (primary) hypertension: Secondary | ICD-10-CM | POA: Diagnosis not present

## 2023-05-02 DIAGNOSIS — G4733 Obstructive sleep apnea (adult) (pediatric): Secondary | ICD-10-CM | POA: Diagnosis not present

## 2023-05-12 DIAGNOSIS — G4733 Obstructive sleep apnea (adult) (pediatric): Secondary | ICD-10-CM | POA: Diagnosis not present

## 2023-05-18 ENCOUNTER — Ambulatory Visit (HOSPITAL_COMMUNITY)
Admission: RE | Admit: 2023-05-18 | Discharge: 2023-05-18 | Disposition: A | Discharge status: Home / Self Care | Payer: Medicare Other | Source: Ambulatory Visit | Attending: Internal Medicine | Admitting: Internal Medicine

## 2023-05-18 VITALS — BP 152/60 | HR 61 | Ht 69.0 in | Wt 198.0 lb

## 2023-05-18 DIAGNOSIS — G4733 Obstructive sleep apnea (adult) (pediatric): Secondary | ICD-10-CM | POA: Diagnosis not present

## 2023-05-18 DIAGNOSIS — Z7984 Long term (current) use of oral hypoglycemic drugs: Secondary | ICD-10-CM | POA: Diagnosis not present

## 2023-05-18 DIAGNOSIS — Z6829 Body mass index (BMI) 29.0-29.9, adult: Secondary | ICD-10-CM | POA: Insufficient documentation

## 2023-05-18 DIAGNOSIS — I480 Paroxysmal atrial fibrillation: Secondary | ICD-10-CM

## 2023-05-18 DIAGNOSIS — E669 Obesity, unspecified: Secondary | ICD-10-CM | POA: Diagnosis not present

## 2023-05-18 DIAGNOSIS — Z79899 Other long term (current) drug therapy: Secondary | ICD-10-CM | POA: Insufficient documentation

## 2023-05-18 DIAGNOSIS — E119 Type 2 diabetes mellitus without complications: Secondary | ICD-10-CM | POA: Diagnosis not present

## 2023-05-18 DIAGNOSIS — D6869 Other thrombophilia: Secondary | ICD-10-CM | POA: Diagnosis not present

## 2023-05-18 DIAGNOSIS — Z7901 Long term (current) use of anticoagulants: Secondary | ICD-10-CM | POA: Insufficient documentation

## 2023-05-18 DIAGNOSIS — I10 Essential (primary) hypertension: Secondary | ICD-10-CM | POA: Diagnosis not present

## 2023-05-18 NOTE — Progress Notes (Addendum)
 Primary Care Physician: Latrelle Dodrill, MD Primary Cardiologist: Dr. Rennis Golden for dyslipidemia Primary Electrophysiologist: None Referring Physician: [facility]   Tiffany Becker is a 78 y.o. female with a history of elevated coronary calcium score - 724 (93rd percentile), HLD, HTN, T2DM, osteoarthritis, atrial fibrillation who presents for consultation in the [facility]. The patient was initially diagnosed with atrial fibrillation on 08/01/22 after presenting to [facility] ED in Afib with RVR. She converted to NSR after cardizem and IV fluids by EMS. Given diltiazem 30 mg prn. Patient is on Eliquis 5 mg BID for a CHADS2VASC score of 6.  On evaluation today, she is currently in NSR. She notes that the day before ED visit she overexerted herself cooking for a family reunion. She notes may have had Afib 5 years ago due to similar symptoms; occurred after she helped her brother move. She has a BP cuff at home which alerts her when she possibly has an abnormal rhythm. She feels when she is in Afib due to palpitations.  She does not drink alcohol. She drinks 1-2 cups of coffee daily. She believes she does snore and at least historically felt like she stopped breathing at night.  She is compliant with anticoagulation and has not missed any doses. She has no bleeding concerns.  On follow up 09/07/22, she is currently in NSR. Since last office visit, she thinks she has had multiple episodes of Afib. Her BP cuff will alert her if there is an irregular heart beat and she has had that intermittently since last office visit. However, she only sometimes will feel the palpitations she did previously and notes it tends to happen in the afternoon. No missed doses of Eliquis.   On follow up 10/13/22, she is currently in NSR. Cardiac monitor worn 6/26-7/3 did not show any episodes of Afib. She has not had any episodes of Afib since last office visit. She received her CPAP equipment  2 days ago and is still getting used to it. She will sporadically take celebrex prn arthritic pain. No bleeding issues on Eliquis.   On follow up 05/18/23, she is currently in NSR. She was in the ED on 2/10 for Afib and spontaneously converted to NSR after taking diltiazem 30 mg dose. She is on coreg 3.125 mg BID. She did note one other evening where she could feel as though she might go into Afib and took diltiazem 30 mg. She does note difficulty sleeping a lot of nights. No missed doses of Eliquis 5 mg BID.   Today, she denies symptoms of chest pain, shortness of breath, orthopnea, PND, lower extremity edema, dizziness, presyncope, syncope, snoring, daytime somnolence, bleeding, or neurologic sequela. The patient is tolerating medications without difficulties and is otherwise without complaint today.   Atrial Fibrillation Risk Factors:  she does have symptoms or diagnosis of sleep apnea. she does not have a history of rheumatic fever. she does not have a history of alcohol use. The patient does not have a history of early familial atrial fibrillation or other arrhythmias.  she has a BMI of Body mass index is 29.24 kg/m.Marland Kitchen Filed Weights   05/18/23 1310  Weight: 89.8 kg      Family History  Problem Relation Age of Onset   COPD Mother    Osteoarthritis Mother    Asthma Mother    Heart disease Mother    Coronary artery disease Father 91       died age  60   Heart disease Father    Stroke Father    Diabetes Brother        bladder, pacemaker, Hemochromatosis   Cancer Brother 13       Bladder    Atrial Fibrillation Management history:  Previous antiarrhythmic drugs: None Previous cardioversions: None Previous ablations: None Anticoagulation history: Eliquis 5 mg BID   Past Medical History:  Diagnosis Date   Abnormal renal ultrasound 04/25/2002   Diabetes mellitus without complication (HCC)    Diverticulosis of colon 08/12/2004   GERD (gastroesophageal reflux disease)    H  pylori neg 08/21/02   Hypertension    Peptic ulcer disease 03/14/1980   EGD   Primary osteoarthritis of both hips 06/13/2007   Dr Turner Daniels   Pyelonephritis 01/12/2001   Vitreous detachment    Past Surgical History:  Procedure Laterality Date   BREAST REDUCTION SURGERY  03/14/93   BUNIONECTOMY  12/09   L with 2nd and 5th toe straightening   CERVICAL DISCECTOMY  03/14/06   C6-7   CHOLECYSTECTOMY  01/12/89   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   SALPINGOOPHORECTOMY  09/11/97   ruptured cyst   TONSILLECTOMY  03/14/52   TOTAL ABDOMINAL HYSTERECTOMY  01/12/89   L salpingoophorectomy for ruptured cyst   TUBAL LIGATION  03/14/72   UMBILICAL HERNIA REPAIR  03/14/64    Current Outpatient Medications  Medication Sig Dispense Refill   amLODipine (NORVASC) 5 MG tablet Take 1 tablet (5 mg total) by mouth in the morning and at bedtime. 180 tablet 3   apixaban (ELIQUIS) 5 MG TABS tablet Take 1 tablet (5 mg total) by mouth 2 (two) times daily. 180 tablet 1   carvedilol (COREG) 3.125 MG tablet Take 1 tablet (3.125 mg total) by mouth 2 (two) times daily with a meal. 180 tablet 3   diltiazem (CARDIZEM) 30 MG tablet Take 1 tablet (30 mg total) by mouth 3 (three) times daily as needed (for heart rate >110). 60 tablet 2   glipiZIDE (GLUCOTROL XL) 2.5 MG 24 hr tablet TAKE 1 TABLET BY MOUTH DAILY  WITH BREAKFAST 100 tablet 2   lisinopril (ZESTRIL) 40 MG tablet TAKE 1 TABLET BY MOUTH DAILY 100 tablet 2   MAGNESIUM PO Taking 1 gummy by mouth daily     metFORMIN (GLUCOPHAGE) 1000 MG tablet TAKE 1 TABLET BY MOUTH TWICE  DAILY WITH MEALS 200 tablet 2   Multiple Vitamins-Minerals (MULTIVITAMIN GUMMIES ADULT PO) Taking 1 gummy by mouth several times a week     rosuvastatin (CRESTOR) 5 MG tablet Take 5 mg by mouth 3 (three) times a week. Monday, Wednesday, Friday     VITAMIN D PO Take 1 tablet by mouth 4 (four) times a week.     No current facility-administered medications for this encounter.    Allergies  Allergen Reactions    Statins Other (See Comments)    Simvastatin: Hair loss and leg pain Atorvastatin: Hair Loss Pravasatin: Myalgia Rosuvastatin: High doses lead to myalgia/joint pain    ROS- All systems are reviewed and negative except as per the HPI above.  Physical Exam: Vitals:   05/18/23 1310  BP: (!) 152/60  Pulse: 61  Weight: 89.8 kg  Height: 5\' 9"  (1.753 m)     GEN- The patient is well appearing, alert and oriented x 3 today.   Neck - no JVD or carotid bruit noted Lungs- Clear to ausculation bilaterally, normal work of breathing Heart- Regular rate and rhythm, no murmurs, rubs or gallops, PMI  not laterally displaced Extremities- no clubbing, cyanosis, or edema Skin - no rash or ecchymosis noted   Wt Readings from Last 3 Encounters:  05/18/23 89.8 kg  04/24/23 88.5 kg  04/17/23 89.4 kg   EKG today demonstrates  Vent. rate 61 BPM PR interval 146 ms QRS duration 72 ms QT/QTcB 374/376 ms P-R-T axes 65 47 48 Normal sinus rhythm Normal ECG When compared with ECG of 24-Apr-2023 20:23, PREVIOUS ECG IS PRESENT  Echo 08/25/22: 1. Left ventricular ejection fraction, by estimation, is 60 to 65%. The  left ventricle has normal function. The left ventricle has no regional  wall motion abnormalities. Left ventricular diastolic parameters were  normal.   2. Right ventricular systolic function is normal. The right ventricular  size is normal. Tricuspid regurgitation signal is inadequate for assessing  PA pressure.   3. The mitral valve is normal in structure. Trivial mitral valve  regurgitation. No evidence of mitral stenosis.   4. The aortic valve is normal in structure. Aortic valve regurgitation is  not visualized. No aortic stenosis is present.   5. The inferior vena cava is normal in size with greater than 50%  respiratory variability, suggesting right atrial pressure of 3 mmHg.   Cardiac monitor 6/26-09/14/2022: Patch Wear Time:  6 days and 17 hours    Predominant rhythm was  sinus rhythm Patient had a min HR of 49 bpm, max HR of 188 bpm, and avg HR of 69 bpm. 18 SVT episodes, all less than 16 seconds Less than 1% ventricular and supraventricular ectopy No patient triggered episodes recorded   Will Camnitz, MD  Epic records are reviewed at length today.  CHA2DS2-VASc Score = 6  The patient's score is based upon: CHF History: 0 HTN History: 1 Diabetes History: 1 Stroke History: 0 Vascular Disease History: 1 Age Score: 2 Gender Score: 1       ASSESSMENT AND PLAN: Paroxysmal Atrial Fibrillation (ICD10:  I48.0) The patient's CHA2DS2-VASc score is 6, indicating a 9.7% annual risk of stroke.    She is currently in NSR. We discussed rhythm control in the form of an ablation versus rate control. After discussion, we will continue with current regimen. She does note that diltiazem 30 mg PRN is effective for her in converting to normal rhythm. If episodes increase, she will call clinic and we can then do the following: discontinue coreg and begin diltiazem 120 mg daily with plan for a f/u 2 weeks after to reassess. She will discuss sleep issues with her PCP.   Secondary Hypercoagulable State (ICD10:  D68.69) The patient is at significant risk for stroke/thromboembolism based upon her CHA2DS2-VASc Score of 6.  Continue Apixaban (Eliquis).  Continue without interruption. We did briefly discuss the Watchman procedure.   3. Obesity Body mass index is 29.24 kg/m. Encouraged walking as tolerated. She likes to be active but does take care of her husband.  4. Obstructive sleep apnea Compliant with CPAP.   5. HTN Elevated today, will continue to monitor at home.   Follow up 6 months Afib clinic.   Lake Bells, PA-C Afib Clinic [facility] [address] [place], [place] [address] [phone] 05/18/2023 1:53 PM

## 2023-06-11 DIAGNOSIS — G4733 Obstructive sleep apnea (adult) (pediatric): Secondary | ICD-10-CM | POA: Diagnosis not present

## 2023-06-28 DIAGNOSIS — G4733 Obstructive sleep apnea (adult) (pediatric): Secondary | ICD-10-CM | POA: Diagnosis not present

## 2023-07-04 DIAGNOSIS — G4733 Obstructive sleep apnea (adult) (pediatric): Secondary | ICD-10-CM | POA: Diagnosis not present

## 2023-07-12 DIAGNOSIS — G4733 Obstructive sleep apnea (adult) (pediatric): Secondary | ICD-10-CM | POA: Diagnosis not present

## 2023-07-19 ENCOUNTER — Other Ambulatory Visit: Payer: Self-pay | Admitting: Family Medicine

## 2023-08-07 ENCOUNTER — Other Ambulatory Visit: Payer: Self-pay | Admitting: Internal Medicine

## 2023-08-07 DIAGNOSIS — I480 Paroxysmal atrial fibrillation: Secondary | ICD-10-CM

## 2023-08-08 ENCOUNTER — Other Ambulatory Visit: Payer: Self-pay | Admitting: Student

## 2023-08-08 DIAGNOSIS — I10 Essential (primary) hypertension: Secondary | ICD-10-CM

## 2023-08-08 NOTE — Telephone Encounter (Signed)
 Prescription refill request for Eliquis  received. Indication: Afib  Last office visit: 05/18/23 Virgil Griffiths)  Scr: 0.99 (04/24/23)  Age: 78 Weight: 89.8kg  Appropriate dose. Refill sent.

## 2023-08-15 ENCOUNTER — Emergency Department (HOSPITAL_BASED_OUTPATIENT_CLINIC_OR_DEPARTMENT_OTHER)

## 2023-08-15 ENCOUNTER — Encounter (HOSPITAL_BASED_OUTPATIENT_CLINIC_OR_DEPARTMENT_OTHER): Payer: Self-pay | Admitting: Urology

## 2023-08-15 ENCOUNTER — Other Ambulatory Visit: Payer: Self-pay

## 2023-08-15 ENCOUNTER — Emergency Department (HOSPITAL_BASED_OUTPATIENT_CLINIC_OR_DEPARTMENT_OTHER)
Admission: EM | Admit: 2023-08-15 | Discharge: 2023-08-15 | Disposition: A | Discharge status: Home / Self Care | Attending: Emergency Medicine | Admitting: Emergency Medicine

## 2023-08-15 DIAGNOSIS — E119 Type 2 diabetes mellitus without complications: Secondary | ICD-10-CM | POA: Insufficient documentation

## 2023-08-15 DIAGNOSIS — S060X0A Concussion without loss of consciousness, initial encounter: Secondary | ICD-10-CM | POA: Insufficient documentation

## 2023-08-15 DIAGNOSIS — Z7901 Long term (current) use of anticoagulants: Secondary | ICD-10-CM | POA: Diagnosis not present

## 2023-08-15 DIAGNOSIS — Y93E5 Activity, floor mopping and cleaning: Secondary | ICD-10-CM | POA: Diagnosis not present

## 2023-08-15 DIAGNOSIS — S0093XA Contusion of unspecified part of head, initial encounter: Secondary | ICD-10-CM | POA: Diagnosis present

## 2023-08-15 DIAGNOSIS — Z79899 Other long term (current) drug therapy: Secondary | ICD-10-CM | POA: Insufficient documentation

## 2023-08-15 DIAGNOSIS — W07XXXA Fall from chair, initial encounter: Secondary | ICD-10-CM | POA: Insufficient documentation

## 2023-08-15 DIAGNOSIS — Z7984 Long term (current) use of oral hypoglycemic drugs: Secondary | ICD-10-CM | POA: Diagnosis not present

## 2023-08-15 DIAGNOSIS — I10 Essential (primary) hypertension: Secondary | ICD-10-CM | POA: Insufficient documentation

## 2023-08-15 DIAGNOSIS — Z981 Arthrodesis status: Secondary | ICD-10-CM | POA: Diagnosis not present

## 2023-08-15 DIAGNOSIS — S0990XA Unspecified injury of head, initial encounter: Secondary | ICD-10-CM

## 2023-08-15 DIAGNOSIS — R9082 White matter disease, unspecified: Secondary | ICD-10-CM | POA: Diagnosis not present

## 2023-08-15 DIAGNOSIS — S0003XA Contusion of scalp, initial encounter: Secondary | ICD-10-CM | POA: Diagnosis not present

## 2023-08-15 MED ORDER — ONDANSETRON 4 MG PO TBDP: 4.0000 mg | ORAL_TABLET | Freq: Three times a day (TID) | ORAL | 0 refills | Status: DC | PRN

## 2023-08-15 NOTE — ED Notes (Signed)
 Discharge instructions reviewed with patient. Patient verbalizes understanding, no further questions at this time. Medications/prescriptions and follow up information provided. No acute distress noted at time of departure.

## 2023-08-15 NOTE — ED Notes (Signed)
Hematoma noted to posterior head 

## 2023-08-15 NOTE — ED Triage Notes (Signed)
 Pt states was cleaning windows and fell, hit head on bathroom  Large knot noted to posterior scalp  No laceration noted   NAD noted  On eliquis 

## 2023-08-15 NOTE — Discharge Instructions (Addendum)
 CT scan of your head and neck did not show any concerning findings.  No internal bleeding.  You likely have a concussion.  It is normal for you to have a mild headache.  Take Tylenol 1000 mg every 6 hours as needed for headache.  I have sent some nausea medicine into the pharmacy.  For severe nausea, throwing up to the point you cannot keep anything down, severe headache please return for evaluation otherwise of listed a sports medicine clinic for you to follow-up with if your symptoms last longer than a week.

## 2023-08-15 NOTE — ED Provider Notes (Signed)
 [facility] Provider Note   CSN: [id number] Arrival date & time: 08/15/23  1550     History  Chief Complaint  Patient presents with   Tiffany Becker    Tiffany Becker is a 78 y.o. female.  78 year old female presents today for concern of a fall.  She is on Eliquis .  Last dose this morning.  She was cleaning windows when she stood on a small chair which suddenly gave out from underneath her causing her to fall backwards onto her bathroom floor.  Reports hematoma otherwise no laceration.  Denies any other complaints.  No loss of consciousness.  The history is provided by the patient. No language interpreter was used.       Home Medications Prior to Admission medications   Medication Sig Start Date End Date Taking? Authorizing Provider  ondansetron (ZOFRAN-ODT) 4 MG disintegrating tablet Take 1 tablet (4 mg total) by mouth every 8 (eight) hours as needed for nausea or vomiting. 08/15/23  Yes Trinaty Bundrick, PA-C  amLODipine  (NORVASC ) 5 MG tablet TAKE 1 TABLET BY MOUTH TWICE  DAILY IN THE MORNING AND AT  BEDTIME 07/19/23   Candee Cha, MD  apixaban  (ELIQUIS ) 5 MG TABS tablet TAKE 1 TABLET BY MOUTH TWICE  DAILY 08/08/23   Hilty, Aviva Lemmings, MD  carvedilol  (COREG ) 3.125 MG tablet TAKE 1 TABLET BY MOUTH TWICE DAILY WITH MEALS 08/10/23   Candee Cha, MD  diltiazem  (CARDIZEM ) 30 MG tablet Take 1 tablet (30 mg total) by mouth 3 (three) times daily as needed (for heart rate >110). 08/09/22   Nathanel Bal, PA-C  glipiZIDE  (GLUCOTROL  XL) 2.5 MG 24 hr tablet TAKE 1 TABLET BY MOUTH DAILY  WITH BREAKFAST 04/05/23   Candee Cha, MD  lisinopril  (ZESTRIL ) 40 MG tablet TAKE 1 TABLET BY MOUTH DAILY 04/05/23   McIntyre, Brittany J, MD  MAGNESIUM PO Taking 1 gummy by mouth daily    [provider]  metFORMIN  (GLUCOPHAGE ) 1000 MG tablet TAKE 1 TABLET BY MOUTH TWICE  DAILY WITH MEALS 04/05/23   McIntyre, Brittany J, MD  Multiple  Vitamins-Minerals (MULTIVITAMIN GUMMIES ADULT PO) Taking 1 gummy by mouth several times a week    [provider]  rosuvastatin  (CRESTOR ) 5 MG tablet Take 5 mg by mouth 3 (three) times a week. Monday, Wednesday, Friday 01/26/23   [provider]  VITAMIN D  PO Take 1 tablet by mouth 4 (four) times a week.    [provider]      Allergies    Statins    Review of Systems   Review of Systems  Eyes:  Negative for visual disturbance.  Gastrointestinal:  Positive for nausea. Negative for vomiting.  Neurological:  Positive for headaches. Negative for syncope.  All other systems reviewed and are negative.   Physical Exam Updated Vital Signs BP (!) 145/66 (BP Location: Right Arm)   Pulse 69   Temp 97.9 F (36.6 C) (Oral)   Resp 16   Ht 5\' 9"  (1.753 m)   Wt 89.8 kg   SpO2 99%   BMI 29.24 kg/m  Physical Exam Vitals and nursing note reviewed.  Constitutional:      General: She is not in acute distress.    Appearance: Normal appearance. She is not ill-appearing.  HENT:     Head: Normocephalic and atraumatic.     Nose: Nose normal.  Eyes:     Conjunctiva/sclera: Conjunctivae normal.  Cardiovascular:  Rate and Rhythm: Normal rate and regular rhythm.  Pulmonary:     Effort: Pulmonary effort is normal. No respiratory distress.  Musculoskeletal:        General: No deformity. Normal range of motion.     Cervical back: Normal range of motion.  Skin:    Findings: No rash.  Neurological:     General: No focal deficit present.     Mental Status: She is alert and oriented to person, place, and time. Mental status is at baseline.     ED Results / Procedures / Treatments   Labs (all labs ordered are listed, but only abnormal results are displayed) Labs Reviewed - No data to display  EKG None  Radiology CT Head Wo Contrast Result Date: 08/15/2023 CLINICAL DATA:  Fall, contusion to posterior scalp EXAM: CT HEAD WITHOUT CONTRAST CT CERVICAL SPINE WITHOUT  CONTRAST TECHNIQUE: Multidetector CT imaging of the head and cervical spine was performed following the standard protocol without intravenous contrast. Multiplanar CT image reconstructions of the cervical spine were also generated. RADIATION DOSE REDUCTION: This exam was performed according to the departmental dose-optimization program which includes automated exposure control, adjustment of the mA and/or kV according to patient size and/or use of iterative reconstruction technique. COMPARISON:  05/14/2019 FINDINGS: CT HEAD FINDINGS Brain: No evidence of acute infarction, hemorrhage, hydrocephalus, extra-axial collection or mass lesion/mass effect. Periventricular white matter hypodensity Vascular: No hyperdense vessel or unexpected calcification. Skull: Normal. Negative for fracture or focal lesion. Sinuses/Orbits: No acute finding. Other: None. CT CERVICAL SPINE FINDINGS Alignment: Degenerative and postoperative straightening of the normal cervical lordosis. Skull base and vertebrae: No acute fracture. No primary bone lesion or focal pathologic process. Soft tissues and spinal canal: No prevertebral fluid or swelling. No visible canal hematoma. Disc levels: Status post anterior cervical discectomy and fusion of C6-C7. Otherwise mild multilevel disc degenerative change. Upper chest: Negative. Other: None. IMPRESSION: 1. No acute intracranial pathology. Small-vessel white matter disease. 2. No fracture or static subluxation of the cervical spine. 3. Status post anterior cervical discectomy and fusion of C6-C7. Otherwise mild multilevel disc degenerative change. Electronically Signed   By: Fredricka Jenny M.D.   On: 08/15/2023 17:08   CT Cervical Spine Wo Contrast Result Date: 08/15/2023 CLINICAL DATA:  Fall, contusion to posterior scalp EXAM: CT HEAD WITHOUT CONTRAST CT CERVICAL SPINE WITHOUT CONTRAST TECHNIQUE: Multidetector CT imaging of the head and cervical spine was performed following the standard protocol  without intravenous contrast. Multiplanar CT image reconstructions of the cervical spine were also generated. RADIATION DOSE REDUCTION: This exam was performed according to the departmental dose-optimization program which includes automated exposure control, adjustment of the mA and/or kV according to patient size and/or use of iterative reconstruction technique. COMPARISON:  05/14/2019 FINDINGS: CT HEAD FINDINGS Brain: No evidence of acute infarction, hemorrhage, hydrocephalus, extra-axial collection or mass lesion/mass effect. Periventricular white matter hypodensity Vascular: No hyperdense vessel or unexpected calcification. Skull: Normal. Negative for fracture or focal lesion. Sinuses/Orbits: No acute finding. Other: None. CT CERVICAL SPINE FINDINGS Alignment: Degenerative and postoperative straightening of the normal cervical lordosis. Skull base and vertebrae: No acute fracture. No primary bone lesion or focal pathologic process. Soft tissues and spinal canal: No prevertebral fluid or swelling. No visible canal hematoma. Disc levels: Status post anterior cervical discectomy and fusion of C6-C7. Otherwise mild multilevel disc degenerative change. Upper chest: Negative. Other: None. IMPRESSION: 1. No acute intracranial pathology. Small-vessel white matter disease. 2. No fracture or static subluxation of the cervical spine. 3.  Status post anterior cervical discectomy and fusion of C6-C7. Otherwise mild multilevel disc degenerative change. Electronically Signed   By: Fredricka Jenny M.D.   On: 08/15/2023 17:08    Procedures Procedures    Medications Ordered in ED Medications - No data to display  ED Course/ Medical Decision Making/ A&P                                 Medical Decision Making Amount and/or Complexity of Data Reviewed Radiology: ordered.  Risk Prescription drug management.   Medical Decision Making / ED Course   This patient presents to the ED for concern of head injury, this  involves an extensive number of treatment options, and is a complaint that carries with it a high risk of complications and morbidity.  The differential diagnosis includes concussion, subdural, epidural, fracture  MDM: 78 year old female presents today for fall on blood thinner. CT head and C-spine without acute intracranial or cervical pathology. Likely concussion. Hematoma noted. No laceration. Concussion protocol discussed. Discharged in stable condition.  Strict return precaution discussed.  Patient voices understanding and is in agreement with plan.   Lab Tests: -I ordered, reviewed, and interpreted labs.   The pertinent results include:   Labs Reviewed - No data to display    EKG  EKG Interpretation Date/Time:    Ventricular Rate:    PR Interval:    QRS Duration:    QT Interval:    QTC Calculation:   R Axis:      Text Interpretation:           Imaging Studies ordered: I ordered imaging studies including CT head, C-spine I independently visualized and interpreted imaging. I agree with the radiologist interpretation   Medicines ordered and prescription drug management: Meds ordered this encounter  Medications   ondansetron (ZOFRAN-ODT) 4 MG disintegrating tablet    Sig: Take 1 tablet (4 mg total) by mouth every 8 (eight) hours as needed for nausea or vomiting.    Dispense:  20 tablet    Refill:  0    Supervising Provider:   Early Glisson [[id number]]    -I have reviewed the patients home medicines and have made adjustments as needed   Reevaluation: After the interventions noted above, I reevaluated the patient and found that they have :improved  Co morbidities that complicate the patient evaluation  Past Medical History:  Diagnosis Date   Abnormal renal ultrasound 04/25/2002   Diabetes mellitus without complication (HCC)    Diverticulosis of colon 08/12/2004   GERD (gastroesophageal reflux disease)    H pylori neg 08/21/02   Hypertension    Peptic ulcer  disease 03/14/1980   EGD   Primary osteoarthritis of both hips 06/13/2007   Dr Carry Clapper   Pyelonephritis 01/12/2001   Vitreous detachment       Dispostion: Discharged in stable condition.  Return precaution discussed.  Patient voices understanding and is in agreement with plan.   Final Clinical Impression(s) / ED Diagnoses Final diagnoses:  Injury of head, initial encounter  Concussion without loss of consciousness, initial encounter    Rx / DC Orders ED Discharge Orders          Ordered    ondansetron (ZOFRAN-ODT) 4 MG disintegrating tablet  Every 8 hours PRN        08/15/23 1839              Lucina Sabal, PA-C 08/15/23 1848  Rosealee Concha, MD 08/15/23 2332

## 2023-09-05 DIAGNOSIS — G4733 Obstructive sleep apnea (adult) (pediatric): Secondary | ICD-10-CM | POA: Diagnosis not present

## 2023-09-15 ENCOUNTER — Encounter (HOSPITAL_COMMUNITY): Payer: Self-pay

## 2023-09-15 ENCOUNTER — Other Ambulatory Visit: Payer: Self-pay

## 2023-09-15 ENCOUNTER — Emergency Department (HOSPITAL_COMMUNITY)

## 2023-09-15 ENCOUNTER — Emergency Department (HOSPITAL_COMMUNITY)
Admission: EM | Admit: 2023-09-15 | Discharge: 2023-09-15 | Disposition: A | Discharge status: Home / Self Care | Attending: Emergency Medicine | Admitting: Emergency Medicine

## 2023-09-15 DIAGNOSIS — R059 Cough, unspecified: Secondary | ICD-10-CM | POA: Diagnosis not present

## 2023-09-15 DIAGNOSIS — I10 Essential (primary) hypertension: Secondary | ICD-10-CM | POA: Diagnosis not present

## 2023-09-15 DIAGNOSIS — N3001 Acute cystitis with hematuria: Secondary | ICD-10-CM | POA: Diagnosis not present

## 2023-09-15 DIAGNOSIS — E119 Type 2 diabetes mellitus without complications: Secondary | ICD-10-CM | POA: Diagnosis not present

## 2023-09-15 DIAGNOSIS — Z9049 Acquired absence of other specified parts of digestive tract: Secondary | ICD-10-CM | POA: Diagnosis not present

## 2023-09-15 DIAGNOSIS — Z79899 Other long term (current) drug therapy: Secondary | ICD-10-CM | POA: Diagnosis not present

## 2023-09-15 DIAGNOSIS — Z7901 Long term (current) use of anticoagulants: Secondary | ICD-10-CM | POA: Diagnosis not present

## 2023-09-15 DIAGNOSIS — R197 Diarrhea, unspecified: Secondary | ICD-10-CM | POA: Insufficient documentation

## 2023-09-15 DIAGNOSIS — I480 Paroxysmal atrial fibrillation: Secondary | ICD-10-CM | POA: Diagnosis not present

## 2023-09-15 DIAGNOSIS — Z7984 Long term (current) use of oral hypoglycemic drugs: Secondary | ICD-10-CM | POA: Insufficient documentation

## 2023-09-15 DIAGNOSIS — R5383 Other fatigue: Secondary | ICD-10-CM | POA: Diagnosis present

## 2023-09-15 DIAGNOSIS — K5730 Diverticulosis of large intestine without perforation or abscess without bleeding: Secondary | ICD-10-CM | POA: Diagnosis not present

## 2023-09-15 HISTORY — DX: Unspecified atrial fibrillation: I48.91

## 2023-09-15 LAB — URINALYSIS, W/ REFLEX TO CULTURE (INFECTION SUSPECTED)
Bilirubin Urine: NEGATIVE
Glucose, UA: NEGATIVE mg/dL
Hgb urine dipstick: NEGATIVE
Ketones, ur: 5 mg/dL — AB
Nitrite: POSITIVE — AB
Protein, ur: NEGATIVE mg/dL
Specific Gravity, Urine: >1.046 — ABNORMAL HIGH (ref 1.005–1.030)
pH: 6 (ref 5.0–8.0)

## 2023-09-15 LAB — COMPREHENSIVE METABOLIC PANEL WITH GFR
ALT: 19 U/L (ref 0–44)
AST: 17 U/L (ref 15–41)
Albumin: 3.5 g/dL (ref 3.5–5.0)
Alkaline Phosphatase: 59 U/L (ref 38–126)
Anion gap: 10 (ref 5–15)
BUN: 18 mg/dL (ref 8–23)
CO2: 27 mmol/L (ref 22–32)
Calcium: 9.4 mg/dL (ref 8.9–10.3)
Chloride: 101 mmol/L (ref 98–111)
Creatinine, Ser: 0.8 mg/dL (ref 0.44–1.00)
GFR, Estimated: >60 mL/min (ref >60)
Glucose, Bld: 211 mg/dL — ABNORMAL HIGH (ref 70–99)
Potassium: 4.8 mmol/L (ref 3.5–5.1)
Sodium: 138 mmol/L (ref 135–145)
Total Bilirubin: 1 mg/dL (ref 0.0–1.2)
Total Protein: 6.2 g/dL — ABNORMAL LOW (ref 6.5–8.1)

## 2023-09-15 LAB — CBC WITH DIFFERENTIAL/PLATELET
Abs Immature Granulocytes: 0.01 K/uL (ref 0.00–0.07)
Basophils Absolute: 0.1 K/uL (ref 0.0–0.1)
Basophils Relative: 1 %
Eosinophils Absolute: 0.1 K/uL (ref 0.0–0.5)
Eosinophils Relative: 1 %
HCT: 41 % (ref 36.0–46.0)
Hemoglobin: 13.7 g/dL (ref 12.0–15.0)
Immature Granulocytes: 0 %
Lymphocytes Relative: 28 %
Lymphs Abs: 2 K/uL (ref 0.7–4.0)
MCH: 30.9 pg (ref 26.0–34.0)
MCHC: 33.4 g/dL (ref 30.0–36.0)
MCV: 92.3 fL (ref 80.0–100.0)
Monocytes Absolute: 0.7 K/uL (ref 0.1–1.0)
Monocytes Relative: 10 %
Neutro Abs: 4.1 K/uL (ref 1.7–7.7)
Neutrophils Relative %: 60 %
Platelets: 227 K/uL (ref 150–400)
RBC: 4.44 MIL/uL (ref 3.87–5.11)
RDW: 12.8 % (ref 11.5–15.5)
WBC: 6.9 K/uL (ref 4.0–10.5)
nRBC: 0 % (ref 0.0–0.2)

## 2023-09-15 LAB — RESP PANEL BY RT-PCR (RSV, FLU A&B, COVID)  RVPGX2
Influenza A by PCR: NEGATIVE
Influenza B by PCR: NEGATIVE
Resp Syncytial Virus by PCR: NEGATIVE
SARS Coronavirus 2 by RT PCR: NEGATIVE

## 2023-09-15 LAB — TROPONIN I (HIGH SENSITIVITY)
Troponin I (High Sensitivity): 6 ng/L (ref <18)
Troponin I (High Sensitivity): 5 ng/L (ref <18)

## 2023-09-15 LAB — MAGNESIUM: Magnesium: 2 mg/dL (ref 1.7–2.4)

## 2023-09-15 MED ORDER — IOHEXOL 350 MG/ML SOLN
75.0000 mL | Freq: Once | INTRAVENOUS | Status: AC | PRN
  Administered 2023-09-15: 75 mL via INTRAVENOUS

## 2023-09-15 MED ORDER — CEPHALEXIN 500 MG PO CAPS: 500.0000 mg | ORAL_CAPSULE | Freq: Four times a day (QID) | ORAL | 0 refills | Status: DC

## 2023-09-15 MED ORDER — SODIUM CHLORIDE 0.9 % IV SOLN
1.0000 g | Freq: Once | INTRAVENOUS | Status: AC
  Administered 2023-09-15: 1 g via INTRAVENOUS
  Filled 2023-09-15: qty 10

## 2023-09-15 NOTE — ED Provider Notes (Signed)
 [place] [facility]ERGENC[facility] DEPARTMENT AT [facility] Provider Note  CSN: [id number] Arrival date & time: 09/15/23 0840  Chief Complaint(s) Generalized Body Aches  HPI Tiffany Becker is a 78 y.o. female here today for feeling unwell.  Patient reports that yesterday, she had myalgias, felt tired and fatigued.  She said that her joints hurt.  She denies any fever, cough.  States she had a couple episodes of diarrhea.  States she has been urinating more frequently.  Unsure if she has had a fever or chills.  She said that she woke up at 3, noticed that she was in A-fib with a heart rate in the 140s.  She takes Coreg  regularly, however has as needed Cardizem  for heart rate greater than 110.  She took her Cardizem , and heart rate improved.   Past Medical History Past Medical History:  Diagnosis Date   Abnormal renal ultrasound 04/25/2002   Atrial fibrillation (HCC)    Diabetes mellitus without complication (HCC)    Diverticulosis of colon 08/12/2004   GERD (gastroesophageal reflux disease)    H pylori neg 08/21/02   Hypertension    Peptic ulcer disease 03/14/1980   EGD   Primary osteoarthritis of both hips 06/13/2007   Dr Liam   Pyelonephritis 01/12/2001   Vitreous detachment    Patient Active Problem List   Diagnosis Date Noted   OSA (obstructive sleep apnea) 04/19/2023   Paroxysmal atrial fibrillation (HCC) 08/09/2022   Hypercoagulable state due to paroxysmal atrial fibrillation (HCC) 08/09/2022   Bacterial sinusitis 04/27/2021   Acute bacterial rhinosinusitis 03/12/2020   Myopathy 02/04/2020   UTI (urinary tract infection) 09/20/2018   Vitamin D  deficiency 04/27/2018   Bereavement 06/28/2016   Bilateral shoulder pain 03/03/2015   Osteopenia 10/30/2014   Estrogen deficiency 10/20/2014   Trigger finger 10/07/2013   Cataracts, bilateral 07/26/2013   Neoplasm of uncertain behavior of skin 07/23/2013   Routine adult health maintenance 07/22/2013   Glaucoma suspect  05/09/2011   Nuclear cataract 05/09/2011   Essential hypertension, benign 11/03/2009   Osteoarthritis of both knees 11/03/2009   Osteoarthritis 08/09/2007   HALLUX RIGIDUS, ACQUIRED 12/26/2006    type 2 diabetes mellitus (HCC) complicated by dyslipidemia  05/11/2006   Dyslipidemia 05/11/2006   OBESITY, NOS 05/11/2006   DDD (degenerative disc disease), cervical 05/11/2006   Home Medication(s) Prior to Admission medications   Medication Sig Start Date End Date Taking? Authorizing Provider  cephALEXin  (KEFLEX ) 500 MG capsule Take 1 capsule (500 mg total) by mouth 4 (four) times daily. 09/15/23  Yes Mannie Pac T, DO  amLODipine  (NORVASC ) 5 MG tablet TAKE 1 TABLET BY MOUTH TWICE  DAILY IN THE MORNING AND AT  BEDTIME 07/19/23   Donah Laymon PARAS, MD  apixaban  (ELIQUIS ) 5 MG TABS tablet TAKE 1 TABLET BY MOUTH TWICE  DAILY 08/08/23   Hilty, Vinie BROCKS, MD  carvedilol  (COREG ) 3.125 MG tablet TAKE 1 TABLET BY MOUTH TWICE DAILY WITH MEALS 08/10/23   Donah Laymon PARAS, MD  diltiazem  (CARDIZEM ) 30 MG tablet Take 1 tablet (30 mg total) by mouth 3 (three) times daily as needed (for heart rate >110). 08/09/22   Terra Pac PARAS, PA-C  glipiZIDE  (GLUCOTROL  XL) 2.5 MG 24 hr tablet TAKE 1 TABLET BY MOUTH DAILY  WITH BREAKFAST 04/05/23   Donah Laymon PARAS, MD  lisinopril  (ZESTRIL ) 40 MG tablet TAKE 1 TABLET BY MOUTH DAILY 04/05/23   McIntyre, Brittany J, MD  MAGNESIUM PO Taking 1 gummy by mouth daily    [provider]  metFORMIN  (GLUCOPHAGE ) 1000 MG tablet TAKE 1 TABLET BY MOUTH TWICE  DAILY WITH MEALS 04/05/23   McIntyre, Brittany J, MD  Multiple Vitamins-Minerals (MULTIVITAMIN GUMMIES ADULT PO) Taking 1 gummy by mouth several times a week    [provider]  ondansetron  (ZOFRAN -ODT) 4 MG disintegrating tablet Take 1 tablet (4 mg total) by mouth every 8 (eight) hours as needed for nausea or vomiting. 08/15/23   Hildegard, Amjad, PA-C  rosuvastatin  (CRESTOR ) 5 MG tablet Take 5 mg by mouth 3  (three) times a week. Monday, Wednesday, Friday 01/26/23   [provider]  VITAMIN D  PO Take 1 tablet by mouth 4 (four) times a week.    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   SALPINGOOPHORECTOMY  09/11/97   ruptured cyst   TONSILLECTOMY  03/14/52   TOTAL ABDOMINAL HYSTERECTOMY  01/12/89   L salpingoophorectomy for ruptured cyst   TUBAL LIGATION  03/14/72   UMBILICAL HERNIA REPAIR  03/14/64   Family History Family History  Problem Relation Age of Onset   COPD Mother    Osteoarthritis Mother    Asthma Mother    Heart disease Mother    Coronary artery disease Father 68       died age 60   Heart disease Father    Stroke Father    Diabetes Brother        bladder, pacemaker, Hemochromatosis   Cancer Brother 32       Bladder    Social History Social History   Tobacco Use   Smoking status: Never    Passive exposure: Never   Smokeless tobacco: Never  Vaping Use   Vaping status: Never Used  Substance Use Topics   Alcohol use: No   Drug use: No   Allergies Statins  Review of Systems Review of Systems  Physical Exam Vital Signs  I have reviewed the triage vital signs BP 128/60   Pulse 71   Temp 97.7 F (36.5 C) (Oral)   Resp 20   Ht 5' 9 (1.753 m)   Wt 88.5 kg   SpO2 97%   BMI 28.80 kg/m   Physical Exam Vitals and nursing note reviewed.  HENT:     Head: Normocephalic.  Cardiovascular:     Rate and Rhythm: Normal rate and regular rhythm.  Pulmonary:     Effort: Pulmonary effort is normal.  Abdominal:     General: Abdomen is flat. There  is no distension.      Palpations: Abdomen is soft.     Tenderness: There is no abdominal tenderness. There is no guarding.  Musculoskeletal:        General: Normal range of motion.  Neurological:     General: No focal deficit present.  Psychiatric:        Mood and Affect: Mood normal.     ED Results and Treatments Labs (all labs ordered are listed, but only abnormal results are displayed) Labs Reviewed  URINALYSIS, W/ REFLEX TO CULTURE (INFECTION SUSPECTED) - Abnormal; Notable for the following components:      Result Value   Color, Urine AMBER (*)    Specific Gravity, Urine >1.046 (*)    Ketones, ur 5 (*)    Nitrite POSITIVE (*)    Leukocytes,Ua TRACE (*)    Bacteria, UA MANY (*)    All other components within normal limits  COMPREHENSIVE METABOLIC PANEL WITH GFR - Abnormal; Notable for the following components:   Glucose, Bld 211 (*)    Total Protein 6.2 (*)    All other components within normal limits  RESP PANEL BY RT-PCR (RSV, FLU A&B, COVID)  RVPGX2  CULTURE, BLOOD (ROUTINE X 2)  CULTURE, BLOOD (ROUTINE X 2)  CBC WITH DIFFERENTIAL/PLATELET  MAGNESIUM  TROPONIN I (HIGH SENSITIVITY)  TROPONIN I (HIGH SENSITIVITY)                                                                                                                          Radiology CT ABDOMEN PELVIS W CONTRAST Result Date: 09/15/2023 CLINICAL DATA:  Diarrhea. EXAM: CT ABDOMEN AND PELVIS WITH CONTRAST TECHNIQUE: Multidetector CT imaging of the abdomen and pelvis was performed using the standard protocol following bolus administration of intravenous contrast. RADIATION DOSE REDUCTION: This exam was performed according to the departmental dose-optimization program which includes automated exposure control, adjustment of the mA and/or kV according to patient size and/or use of iterative reconstruction technique. CONTRAST:  75mL OMNIPAQUE  IOHEXOL  350 MG/ML SOLN COMPARISON:  None Available. FINDINGS: Lower chest: Calcified granuloma noted  posterior left costophrenic sulcus. Lower chest otherwise unremarkable. Hepatobiliary: Scattered tiny hypodensities in the liver parenchyma are too small to characterize but are statistically most likely benign. No followup imaging is recommended. Gallbladder is surgically absent. No intrahepatic or extrahepatic biliary dilation. Pancreas: No focal mass lesion. No dilatation of the main duct. No intraparenchymal cyst. No peripancreatic edema. Spleen: No splenomegaly. No suspicious focal mass lesion. Adrenals/Urinary Tract: Right adrenal gland unremarkable. Nodular thickening of left adrenal gland evident. Kidneys unremarkable. No evidence for hydroureter. The urinary bladder appears normal for the degree of distention. Stomach/Bowel: Stomach is unremarkable. No gastric wall thickening. No evidence of outlet obstruction. Duodenum is normally positioned as is the ligament of Treitz. No small bowel wall thickening. No small bowel dilatation. The terminal ileum is normal. The appendix is not well visualized, but there is no edema or inflammation in the region of the cecal tip  to suggest appendicitis. No gross colonic mass. No colonic wall thickening. Diverticular changes are noted in the left colon without evidence of diverticulitis. Vascular/Lymphatic: There is mild atherosclerotic calcification of the abdominal aorta without aneurysm. There is no gastrohepatic or hepatoduodenal ligament lymphadenopathy. No retroperitoneal or mesenteric lymphadenopathy. No pelvic sidewall lymphadenopathy. Reproductive: Hysterectomy.  There is no adnexal mass. Other: No intraperitoneal free fluid. Musculoskeletal: No worrisome lytic or sclerotic osseous abnormality. IMPRESSION: 1. No acute findings in the abdomen or pelvis. Specifically, no findings to explain the patient's history of diarrhea. 2. Left colonic diverticulosis without diverticulitis. 3.  Aortic Atherosclerosis (ICD10-I70.0). Electronically Signed   By: Camellia Candle M.D.    On: 09/15/2023 12:23   DG Chest Portable 1 View Result Date: 09/15/2023 CLINICAL DATA:  Cough. EXAM: PORTABLE CHEST 1 VIEW COMPARISON:  04/24/2023 FINDINGS: The lungs are clear without focal pneumonia, edema, pneumothorax or pleural effusion. The cardiopericardial silhouette is within normal limits for size. No acute bony abnormality. Telemetry leads overlie the chest. IMPRESSION: No active disease. Electronically Signed   By: Camellia Candle M.D.   On: 09/15/2023 10:18    Pertinent labs & imaging results that were available during my care of the patient were reviewed by me and considered in my medical decision making (see MDM for details).  Medications Ordered in ED Medications  cefTRIAXone  (ROCEPHIN ) 1 g in sodium chloride  0.9 % 100 mL IVPB (has no administration in time range)  iohexol  (OMNIPAQUE ) 350 MG/ML injection 75 mL (75 mLs Intravenous Contrast Given 09/15/23 1203)                                                                                                                                     Procedures Procedures  (including critical care time)  Medical Decision Making / ED Course   This patient presents to the ED for concern of feeling unwell, myalgias, this involves an extensive number of treatment options, and is a complaint that carries with it a high risk of complications and morbidity.  The differential diagnosis includes viral syndrome, diarrheal illness, ACS, UTI, intra-abdominal infection, electrolyte abnormalities, dehydration, anemia.  MDM: Patient overall well-appearing on exam, normal vital signs.  EKG shows normal sinus rhythm.  Sounds though she was feeling unwell yesterday, followed subsequently by period of A-fib which aborted with her appropriate medications.  Patient's symptoms of myalgias, feeling unwell sound most like an infectious process to me.  Blood work ordered, including blood cultures and an abundance of caution.  This patient does not have sepsis.  Will  check urinalysis on the patient.  Reassessment 2:15 PM-patient CT abdomen pelvis negative.  Troponin negative.  Patient's urinalysis showed nitrate positive urine.  Will treat with Rocephin , discharged with antibiotics.  Likely cause of her myalgias.  No evidence of sepsis.  Will discharge patient have her follow-up with her PCP.   Additional history obtained: -Additional history obtained from brother at bedside -External  records from outside source obtained and reviewed including: Chart review including previous notes, labs, imaging, consultation notes   Lab Tests: -I ordered, reviewed, and interpreted labs.   The pertinent results include:   Labs Reviewed  URINALYSIS, W/ REFLEX TO CULTURE (INFECTION SUSPECTED) - Abnormal; Notable for the following components:      Result Value   Color, Urine AMBER (*)    Specific Gravity, Urine >1.046 (*)    Ketones, ur 5 (*)    Nitrite POSITIVE (*)    Leukocytes,Ua TRACE (*)    Bacteria, UA MANY (*)    All other components within normal limits  COMPREHENSIVE METABOLIC PANEL WITH GFR - Abnormal; Notable for the following components:   Glucose, Bld 211 (*)    Total Protein 6.2 (*)    All other components within normal limits  RESP PANEL BY RT-PCR (RSV, FLU A&B, COVID)  RVPGX2  CULTURE, BLOOD (ROUTINE X 2)  CULTURE, BLOOD (ROUTINE X 2)  CBC WITH DIFFERENTIAL/PLATELET  MAGNESIUM  TROPONIN I (HIGH SENSITIVITY)  TROPONIN I (HIGH SENSITIVITY)      EKG my independent review of the patient's EKG shows no ST segment depressions or elevations, no T wave inversions, no evidence of acute ischemia.  EKG Interpretation Date/Time:    Ventricular Rate:    PR Interval:    QRS Duration:    QT Interval:    QTC Calculation:   R Axis:      Text Interpretation:           Imaging Studies ordered: I ordered imaging studies including CT abdomen pelvis I independently visualized and interpreted imaging. I agree with the radiologist  interpretation   Medicines ordered and prescription drug management: Meds ordered this encounter  Medications   iohexol  (OMNIPAQUE ) 350 MG/ML injection 75 mL   cefTRIAXone  (ROCEPHIN ) 1 g in sodium chloride  0.9 % 100 mL IVPB    Antibiotic Indication::   UTI   cephALEXin  (KEFLEX ) 500 MG capsule    Sig: Take 1 capsule (500 mg total) by mouth 4 (four) times daily.    Dispense:  28 capsule    Refill:  0    -I have reviewed the patients home medicines and have made adjustments as needed   Cardiac Monitoring: The patient was maintained on a cardiac monitor.  I personally viewed and interpreted the cardiac monitored which showed an underlying rhythm of: Normal sinus rhythm  Social Determinants of Health:  Factors impacting patients care include: Lack of access to primary care   Reevaluation: After the interventions noted above, I reevaluated the patient and found that they have :improved  Co morbidities that complicate the patient evaluation  Past Medical History:  Diagnosis Date   Abnormal renal ultrasound 04/25/2002   Atrial fibrillation (HCC)    Diabetes mellitus without complication (HCC)    Diverticulosis of colon 08/12/2004   GERD (gastroesophageal reflux disease)    H pylori neg 08/21/02   Hypertension    Peptic ulcer disease 03/14/1980   EGD   Primary osteoarthritis of both hips 06/13/2007   Dr Liam   Pyelonephritis 01/12/2001   Vitreous detachment       Dispostion: I considered admission for this patient, however she is appropriate for discharge.     Final Clinical Impression(s) / ED Diagnoses Final diagnoses:  Acute cystitis with hematuria     @PCDICTATION @    Mannie Pac T, DO 09/15/23 1442

## 2023-09-15 NOTE — ED Triage Notes (Signed)
 Pt states that she did not feel good yesterday and was aching all over. Pt states she felt like she went into afib last night while laying down. Pt states she took her medication but her HR is elevated. Pt states her heart rate was 140bpm at home. Pt states she took tylenol and aches have improve. Pt denies shortness of breath, nausea or vomiting. Pt has had lightheadedness and diarrhea since yesterday.

## 2023-09-15 NOTE — ED Notes (Signed)
 Extra SST & Lav drawn

## 2023-09-15 NOTE — Discharge Instructions (Addendum)
 While you were in the emergency room, you have blood work done that was normal.  Your CT scan of your abdomen pelvis was also normal.  Your heart rate was normal.  You are diagnosed with a urinary tract infection.  You can take Keflex  4 times per day for the next 1 week.  You may begin taking the medication when you pick it up today.  Follow-up with your PCP next week.  Return to the emergency room if you develop fever, confusion, inability to eat or drink.

## 2023-09-20 LAB — CULTURE, BLOOD (ROUTINE X 2)
Culture: NO GROWTH
Culture: NO GROWTH

## 2023-09-21 ENCOUNTER — Encounter: Payer: Self-pay | Admitting: Family Medicine

## 2023-09-21 ENCOUNTER — Ambulatory Visit: Admitting: Family Medicine

## 2023-09-21 VITALS — BP 126/71 | HR 68 | Ht 69.0 in | Wt 189.4 lb

## 2023-09-21 DIAGNOSIS — I10 Essential (primary) hypertension: Secondary | ICD-10-CM

## 2023-09-21 DIAGNOSIS — N390 Urinary tract infection, site not specified: Secondary | ICD-10-CM | POA: Diagnosis not present

## 2023-09-21 DIAGNOSIS — E1169 Type 2 diabetes mellitus with other specified complication: Secondary | ICD-10-CM

## 2023-09-21 DIAGNOSIS — R319 Hematuria, unspecified: Secondary | ICD-10-CM

## 2023-09-21 DIAGNOSIS — I480 Paroxysmal atrial fibrillation: Secondary | ICD-10-CM

## 2023-09-21 DIAGNOSIS — E785 Hyperlipidemia, unspecified: Secondary | ICD-10-CM

## 2023-09-21 LAB — POCT GLYCOSYLATED HEMOGLOBIN (HGB A1C): HbA1c, POC (controlled diabetic range): 7.1 % — AB (ref 0.0–7.0)

## 2023-09-21 NOTE — Assessment & Plan Note (Signed)
-   Improving on Keflex . Currently taking TID instead of QID d/t GI side effects. - Monitor for symptom resolution - Consider urine culture follow-up if symptoms persist or recur

## 2023-09-21 NOTE — Progress Notes (Signed)
 error

## 2023-09-21 NOTE — Patient Instructions (Signed)
 It was great to see you again today.  Finish antibiotics Stay on current medications Follow up in 6 months   Be well, Dr. Donah

## 2023-09-21 NOTE — Assessment & Plan Note (Signed)
-   A1c improved slightly from 7.2% 5 months ago to 7.1% today - Continue current diabetes and lipid management - Reinforced dietary and lifestyle modifications

## 2023-09-21 NOTE — Progress Notes (Signed)
    SUBJECTIVE:   CHIEF COMPLAINT / HPI:   Tiffany Becker presents today for follow-up after a recent emergency department visit for myalgias. In the ED, she was found to have a nitrate-positive urinalysis and was diagnosed with a urinary tract infection. She was treated with an IM dose of Rocephin  and discharged with a course of Keflex . She reports improvement in her symptoms since starting antibiotics. No new complaints today  Diabetes: Taking glipizide  XL 2.5 mg daily.  Tolerating this medication well.  Hypertension: Currently taking lisinopril  40 mg daily, carvedilol  3.125 mg twice daily, and amlodipine  5 mg daily.  Doing well with these meds.  A-fib: Taking Eliquis  5 mg twice daily, tolerating well with no bleeding  PERTINENT  PMH / PSH:   - Type 2 diabetes mellitus - Hypertension - Atrial fibrillation - Dyslipidemia  OBJECTIVE:   BP 126/71   Pulse 68   Ht 5' 9 (1.753 m)   Wt 189 lb 6.4 oz (85.9 kg)   SpO2 96%   BMI 27.97 kg/m    General: No acute distress, well-appearing Cardio: Regular rate and rhythm. No m/r/g.  Pulmonary: normal effort on RA  ASSESSMENT/PLAN:   Assessment & Plan  type 2 diabetes mellitus (HCC) complicated by dyslipidemia  - A1c improved slightly from 7.2% 5 months ago to 7.1% today - Continue current diabetes and lipid management - Reinforced dietary and lifestyle modifications Urinary tract infection with hematuria, site unspecified - Improving on Keflex . Currently taking TID instead of QID d/t GI side effects. - Monitor for symptom resolution - Consider urine culture follow-up if symptoms persist or recur Essential hypertension, benign Well controlled. Continue current medication regimen.  Paroxysmal atrial fibrillation (HCC) Doing well, continue eliquis     Nonda Carrie, Medical Student [facility]  Patient seen along with medical student [facility]. I personally evaluated this patient along with the  student, and verified all aspects of the history, physical exam, and medical decision making as documented by the student. I agree with the student's documentation and have made all necessary edits.  Laymon JINNY Legions, MD, IBCLC [facility]

## 2023-09-23 NOTE — Assessment & Plan Note (Signed)
Doing well, continue eliquis

## 2023-09-23 NOTE — Assessment & Plan Note (Signed)
 Well controlled. Continue current medication regimen.

## 2023-10-02 DIAGNOSIS — G4733 Obstructive sleep apnea (adult) (pediatric): Secondary | ICD-10-CM | POA: Diagnosis not present

## 2023-10-03 ENCOUNTER — Other Ambulatory Visit (HOSPITAL_BASED_OUTPATIENT_CLINIC_OR_DEPARTMENT_OTHER): Payer: Self-pay | Admitting: Internal Medicine

## 2023-10-11 ENCOUNTER — Telehealth: Payer: Self-pay

## 2023-10-11 NOTE — Telephone Encounter (Signed)
 Patient returns call to nurse line.   She reports being seen in the ED on 09/15/23 for UTI and was prescribed Keflex . She took abx as prescribed, however, is concerned that UTI may have returned.   She reports that she is feeling pain around urethra and right sided back pain. Denies fever or chills.   She also reports that she went into A-fib this morning that woke her from her sleep. She states that she got up and took her medication and she feels back to her normal rhythm. Denies chest pain, palpitations, shortness of breath or dizziness. States that she feels completely normal now.   Scheduled appointment for evaluation tomorrow morning, as this is our next available.   Strict ED precautions discussed.   Chiquita JAYSON English, RN

## 2023-10-11 NOTE — Telephone Encounter (Signed)
 Patient LVM on nurse line reporting UTI symptoms.   Attempted to call patient back, however her husband stated she just stepped out.   Advised she will need an apt for her symptoms.   He reports he will have her call the office as soon as she returns.

## 2023-10-12 ENCOUNTER — Ambulatory Visit

## 2023-10-12 ENCOUNTER — Telehealth: Payer: Self-pay

## 2023-10-12 VITALS — BP 126/68 | HR 63 | Ht 69.0 in | Wt 192.0 lb

## 2023-10-12 DIAGNOSIS — N390 Urinary tract infection, site not specified: Secondary | ICD-10-CM | POA: Diagnosis not present

## 2023-10-12 DIAGNOSIS — I480 Paroxysmal atrial fibrillation: Secondary | ICD-10-CM | POA: Diagnosis not present

## 2023-10-12 LAB — POCT UA - MICROSCOPIC ONLY: WBC, Ur, HPF, POC: >20 (ref 0–5)

## 2023-10-12 LAB — POCT URINALYSIS DIP (MANUAL ENTRY)
Bilirubin, UA: NEGATIVE
Glucose, UA: NEGATIVE mg/dL
Ketones, POC UA: NEGATIVE mg/dL
Nitrite, UA: POSITIVE — AB
Spec Grav, UA: 1.015 (ref 1.010–1.025)
Urobilinogen, UA: 1 U/dL
pH, UA: 6 (ref 5.0–8.0)

## 2023-10-12 MED ORDER — NITROFURANTOIN MONOHYD MACRO 100 MG PO CAPS: 100.0000 mg | ORAL_CAPSULE | Freq: Two times a day (BID) | ORAL | 0 refills | Status: AC

## 2023-10-12 NOTE — Assessment & Plan Note (Signed)
 Appears stable at this time.  - Continue current home regimen

## 2023-10-12 NOTE — Patient Instructions (Signed)
 Thank you for visiting clinic today and allowing us  to participate in your care!  Sorry you're not feeling well! Please take the new antibiotic (Macrobid ) twice a day for the next 5 days. We will contact you when your urine culture returns.   Please schedule an appointment with your PCP as needed.    Reach out any time with any questions or concerns you may have - we are here for you!  Damien Cassis, MD [facility] [phone]

## 2023-10-12 NOTE — Assessment & Plan Note (Addendum)
 Recent UA without culture, completed Keflex  course, remains symptomatic. UA today suggestive of UTI. Urine culture sent.  - Macrobid  100 BID x 5 days (patient's CrCl >30) - Adjust abx as needed pending culture

## 2023-10-12 NOTE — Telephone Encounter (Signed)
 Patient calls nurse line in regards to Macrobid .   She reports this was supposed to be sent to [facility], however they do not have it.   I appears medication was sent to patients mail order pharmacy.  I called [facility] to cancel prescription and resend to [facility], however pharmacist reports the prescription has already been mailed out. He reports delivery is scheduled for tomorrow.   I called patient back and advised the prescription should be delivered by tomorrow. Patient reports she is fine with this and will let us  know if anything changes.   Encouraged hydration.  Precautions discussed for worsening symptoms.

## 2023-10-12 NOTE — Progress Notes (Signed)
    SUBJECTIVE:   CHIEF COMPLAINT / HPI:   UTI -Recently finished keflex  in 8-9 days (7 day supply) -Feels pressure in urethra, something pinching  -Increased frequency, urgency - wearing pads -No dysuria, hematuria -Has diarrhea w metofrmin - tries to keep hygiene  -No fevers, some R kidney ache   Afib -No symptoms at the moment  -Last felt in afib early yesterday morning around 5 am  -Took rescue pill and rested -- lasted for 3 hours  -Has felt normal since   PERTINENT  PMH / PSH: UTI, Afib,   OBJECTIVE:   BP 126/68   Pulse 63   Ht 5' 9 (1.753 m)   Wt 192 lb (87.1 kg)   SpO2 99%   BMI 28.35 kg/m   General: Well-appearing. Resting comfortably in room. CV: Normal S1/S2. No extra heart sounds. Warm and well-perfused. Pulm: Breathing comfortably on room air. CTAB. No increased WOB. Abd: Soft, non-tender, non-distended. No CVA tenderness.  Skin:  Warm, dry. Psych: Pleasant and appropriate.    ASSESSMENT/PLAN:   Assessment & Plan Urinary tract infection without hematuria, site unspecified Recent UA without culture, completed Keflex  course, remains symptomatic. UA today suggestive of UTI. Urine culture sent.  - Macrobid  100 BID x 5 days (patient's CrCl >30) - Adjust abx as needed pending culture  Paroxysmal atrial fibrillation (HCC) Appears stable at this time.  - Continue current home regimen    RTC as needed.   Damien Cassis, MD [facility]

## 2023-10-16 LAB — URINE CULTURE

## 2023-10-17 ENCOUNTER — Ambulatory Visit: Payer: Self-pay | Admitting: Family Medicine

## 2023-10-20 ENCOUNTER — Ambulatory Visit (INDEPENDENT_AMBULATORY_CARE_PROVIDER_SITE_OTHER): Admitting: Family Medicine

## 2023-10-20 VITALS — BP 130/64 | HR 63

## 2023-10-20 DIAGNOSIS — R319 Hematuria, unspecified: Secondary | ICD-10-CM

## 2023-10-20 DIAGNOSIS — N390 Urinary tract infection, site not specified: Secondary | ICD-10-CM | POA: Diagnosis not present

## 2023-10-20 MED ORDER — SULFAMETHOXAZOLE-TRIMETHOPRIM 800-160 MG PO TABS: 1.0000 | ORAL_TABLET | Freq: Two times a day (BID) | ORAL | 0 refills | Status: AC

## 2023-10-20 MED ORDER — PHENAZOPYRIDINE HCL 200 MG PO TABS: 200.0000 mg | ORAL_TABLET | Freq: Three times a day (TID) | ORAL | 0 refills | Status: AC | PRN

## 2023-10-20 NOTE — Patient Instructions (Signed)
 I sent in Azo and bactrim  to take for these symptoms. You may have more of an interstitial cystitis picture. I also recommend following up with your urologist. Keep me updated on your symptoms!

## 2023-10-20 NOTE — Progress Notes (Signed)
    SUBJECTIVE:   CHIEF COMPLAINT / HPI:   UTI symptoms Continued since the beginning of July 2025. S/p keflex  and macrobid .  Symptoms of dysuria improved on both of these medications, though they have returned.  Her urine culture on 10/12/2023 did show E. coli that was pansensitive.  She has a urologist that she sees, and she was thinking about following up with them, as well.  PERTINENT  PMH / PSH: Hypertension, T2DM, arthritis  OBJECTIVE:   BP 130/64   Pulse 63   SpO2 100%   General: Alert and oriented, in NAD, very comfortable on exam Skin: Warm, dry, and intact HEENT: NCAT, EOM grossly normal, midline nasal septum Respiratory: Breathing and speaking comfortably on RA Extremities: Moves all extremities grossly equally Neurological: No gross focal deficit, ambulating at baseline Psychiatric: Appropriate mood and affect   ASSESSMENT/PLAN:   Assessment & Plan Urinary tract infection with hematuria, site unspecified Symptoms remain after Macrobid  administration.  She was administered Keflex  at the beginning of July; however, her urine culture at the end of July remained positive.  Therefore, we will treat with 3 days of Bactrim . We will also treat with 2 days of Pyridium . She could also have some component of interstitial cystitis at this point.  If symptoms remain, would recommend removing bladder irritants and ensure follow-up with urology for ?urethral hypermobility that could be increasing her rates of UTI.  She will keep us  updated on her symptoms.   Stuart Redo, MD [facility]

## 2023-10-21 NOTE — Assessment & Plan Note (Signed)
 Symptoms remain after Macrobid  administration.  She was administered Keflex  at the beginning of July; however, her urine culture at the end of July remained positive.  Therefore, we will treat with 3 days of Bactrim . We will also treat with 2 days of Pyridium . She could also have some component of interstitial cystitis at this point.  If symptoms remain, would recommend removing bladder irritants and ensure follow-up with urology for ?urethral hypermobility that could be increasing her rates of UTI.  She will keep us  updated on her symptoms.

## 2023-10-30 ENCOUNTER — Ambulatory Visit (INDEPENDENT_AMBULATORY_CARE_PROVIDER_SITE_OTHER): Payer: Medicare Other

## 2023-10-30 VITALS — Ht 69.0 in | Wt 192.0 lb

## 2023-10-30 DIAGNOSIS — Z0000 Encounter for general adult medical examination without abnormal findings: Secondary | ICD-10-CM | POA: Diagnosis not present

## 2023-10-30 NOTE — Progress Notes (Signed)
 Because this visit was a virtual/telehealth visit,  certain criteria was not obtained, such a blood pressure, CBG if applicable, and timed get up and go. Any medications not marked as taking were not mentioned during the medication reconciliation part of the visit. Any vitals not documented were not able to be obtained due to this being a telehealth visit or patient was unable to self-report a recent blood pressure reading due to a lack of equipment at home via telehealth. Vitals that have been documented are verbally provided by the patient.   Subjective:   Tiffany Becker is a 78 y.o. who presents for a Medicare Wellness preventive visit.  As a reminder, Annual Wellness Visits don't include a physical exam, and some assessments may be limited, especially if this visit is performed virtually. We may recommend an in-person follow-up visit with your provider if needed.  Visit Complete: Virtual I connected with  Tiffany Becker on 10/30/23 by a audio enabled telemedicine application and verified that I am speaking with the correct person using two identifiers.  Patient Location: Home  Provider Location: Home Office  I discussed the limitations of evaluation and management by telemedicine. The patient expressed understanding and agreed to proceed.  Vital Signs: Because this visit was a virtual/telehealth visit, some criteria may be missing or patient reported. Any vitals not documented were not able to be obtained and vitals that have been documented are patient reported.  VideoDeclined- This patient declined Librarian, academic. Therefore the visit was completed with audio only.  Persons Participating in Visit: Patient.  AWV Questionnaire: No: Patient Medicare AWV questionnaire was not completed prior to this visit.  Cardiac Risk Factors include: advanced age (>60men, >77 women);diabetes mellitus;hypertension;family history of premature cardiovascular  disease;dyslipidemia     Objective:    Today's Vitals   10/30/23 0917  Weight: 192 lb (87.1 kg)  Height: 5' 9 (1.753 m)  PainSc: 0-No pain   Body mass index is 28.35 kg/m.     10/30/2023    9:18 AM 10/12/2023    8:42 AM 09/15/2023    8:54 AM 08/15/2023    3:54 PM 04/24/2023    7:53 PM 04/17/2023   11:13 AM 12/14/2022   10:23 AM  Advanced Directives  Does Patient Have a Medical Advance Directive? Yes No Yes No No;Yes No Yes  Type of Estate agent of [place];Living will  Living will  Healthcare Power of [place];Living will  Living will  Does patient want to make changes to medical advance directive?       No - Guardian declined  Copy of Healthcare Power of Attorney in Chart? No - copy requested      Yes - validated most recent copy scanned in chart (See row information)  Would patient like information on creating a medical advance directive? No - Patient declined No - Patient declined    No - Patient declined     Current Medications (verified) Outpatient Encounter Medications as of 10/30/2023  Medication Sig   amLODipine  (NORVASC ) 5 MG tablet TAKE 1 TABLET BY MOUTH TWICE  DAILY IN THE MORNING AND AT  BEDTIME   apixaban  (ELIQUIS ) 5 MG TABS tablet TAKE 1 TABLET BY MOUTH TWICE  DAILY   carvedilol  (COREG ) 3.125 MG tablet TAKE 1 TABLET BY MOUTH TWICE DAILY WITH MEALS   cephALEXin  (KEFLEX ) 500 MG capsule Take 1 capsule (500 mg total) by mouth 4 (four) times daily. (Patient taking differently: Take 1 capsule (500 mg total)  by mouth 4 (four) times daily.)   COENZYME Q-10 PO Take by mouth.   diltiazem  (CARDIZEM ) 30 MG tablet Take 1 tablet (30 mg total) by mouth 3 (three) times daily as needed (for heart rate >110).   glipiZIDE  (GLUCOTROL  XL) 2.5 MG 24 hr tablet TAKE 1 TABLET BY MOUTH DAILY  WITH BREAKFAST   lisinopril  (ZESTRIL ) 40 MG tablet TAKE 1 TABLET BY MOUTH DAILY   metFORMIN  (GLUCOPHAGE ) 1000 MG tablet TAKE 1 TABLET BY MOUTH TWICE  DAILY WITH MEALS   ondansetron   (ZOFRAN -ODT) 4 MG disintegrating tablet Take 1 tablet (4 mg total) by mouth every 8 (eight) hours as needed for nausea or vomiting.   rosuvastatin  (CRESTOR ) 5 MG tablet TAKE 1 TABLET BY MOUTH DAILY   No facility-administered encounter medications on file as of 10/30/2023.    Allergies (verified) Statins   History: Past Medical History:  Diagnosis Date   Abnormal renal ultrasound 04/25/2002   Atrial fibrillation (HCC)    Diabetes mellitus without complication (HCC)    Diverticulosis of colon 08/12/2004   GERD (gastroesophageal reflux disease)    H pylori neg 08/21/02   Hypertension    Peptic ulcer disease 03/14/1980   EGD   Primary osteoarthritis of both hips 06/13/2007   Dr Liam   Pyelonephritis 01/12/2001   Vitreous detachment    Past Surgical History:  Procedure Laterality Date   BREAST REDUCTION SURGERY  03/14/93   BUNIONECTOMY  12/09   L with 2nd and 5th toe straightening   CERVICAL DISCECTOMY  03/14/06   C6-7   CHOLECYSTECTOMY  01/12/89   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   SALPINGOOPHORECTOMY  09/11/97   ruptured cyst   TONSILLECTOMY  03/14/52   TOTAL ABDOMINAL HYSTERECTOMY  01/12/89   L salpingoophorectomy for ruptured cyst   TUBAL LIGATION  03/14/72   UMBILICAL HERNIA REPAIR  03/14/64   Family History  Problem Relation Age of Onset   COPD Mother    Osteoarthritis Mother    Asthma Mother    Heart disease Mother    Coronary artery disease Father 49       died age 74   Heart disease Father    Stroke Father    Diabetes Brother        bladder, pacemaker, Hemochromatosis   Cancer Brother 22       Bladder   Social History   Socioeconomic History   Marital status: Married    Spouse name: John   Number of children: 2   Years of education: 12   Highest education level: 12th grade  Occupational History   Occupation: Retired-Sales of store fixtures  Tobacco Use   Smoking status: Never    Passive exposure: Never   Smokeless tobacco: Never  Vaping Use   Vaping  status: Never Used  Substance and Sexual Activity   Alcohol use: No   Drug use: No   Sexual activity: Not Currently  Other Topics Concern   Not on file  Social History Narrative   Retired- Nurse, learning disability with 2 levels, 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 Smoke detectors, no tripping hazards.Has grab bars in bathroom.   2 daughters in the area; [place] and [place]. One grandson locally.    Emergency Contact: husband, Norleen [phone]   End of Life Plan: pt has advance directives but does not have in chart, requested copy from family.   Who lives with you: husband    Any pets: dog-Jack   Diet: Pt has a varied diet of  protein, starch and vegetables.   Exercise: Patient reports she is very active throughout the day. Walking the dog, running errands and house chores.    Seatbelts: Pt reports wearing seatbelt when in vehicles.    [place] Exposure/Protection: Pt reports using sunscreen regularly.    Hobbies: church, walking, yard work, cooking         Social Drivers of Corporate investment banker Strain: Low Risk  (10/30/2023)   Overall Financial Resource Strain (CARDIA)    Difficulty of Paying Living Expenses: Not hard at all  Food Insecurity: No Food Insecurity (10/30/2023)   Hunger Vital Sign    Worried About Running Out of Food in the Last Year: Never true    Ran Out of Food in the Last Year: Never true  Transportation Needs: No Transportation Needs (10/30/2023)   PRAPARE - Administrator, Civil Service (Medical): No    Lack of Transportation (Non-Medical): No  Physical Activity: Sufficiently Active (10/30/2023)   Exercise Vital Sign    Days of Exercise per Week: 5 days    Minutes of Exercise per Session: 30 min  Stress: No Stress Concern Present (10/30/2023)   [employer] of Occupational Health - Occupational Stress Questionnaire    Feeling of Stress: Not at all  Social Connections: Socially Integrated (10/30/2023)   Social Connection and Isolation Panel    Frequency of  Communication with Friends and Family: More than three times a week    Frequency of Social Gatherings with Friends and Family: Three times a week    Attends Religious Services: More than 4 times per year    Active Member of Clubs or Organizations: Yes    Attends Engineer, structural: More than 4 times per year    Marital Status: Married    Tobacco Counseling Counseling given: Not Answered    Clinical Intake:  Pre-visit preparation completed: Yes  Pain : No/denies pain Pain Score: 0-No pain     BMI - recorded: 28.35 Nutritional Status: BMI 25 -29 Overweight Nutritional Risks: None Diabetes: Yes CBG done?: No Did pt. bring in CBG monitor from home?: No  Lab Results  Component Value Date   HGBA1C 7.1 (A) 09/21/2023   HGBA1C 7.2 (A) 04/17/2023   HGBA1C 7.6 (A) 12/14/2022     How often do you need to have someone help you when you read instructions, pamphlets, or other written materials from your doctor or pharmacy?: 1 - Never What is the last grade level you completed in school?: HSG  Interpreter Needed?: No  Information entered by :: Henli Hey N. Dennisha Mouser, LPN.   Activities of Daily Living     10/30/2023    9:21 AM  In your present state of health, do you have any difficulty performing the following activities:  Hearing? 0  Vision? 0  Difficulty concentrating or making decisions? 0  Walking or climbing stairs? 0  Dressing or bathing? 0  Doing errands, shopping? 0  Preparing Food and eating ? N  Using the Toilet? N  In the past six months, have you accidently leaked urine? Y  Do you have problems with loss of bowel control? Y  Managing your Medications? N  Managing your Finances? N  Housekeeping or managing your Housekeeping? N    Patient Care Team: Donah Laymon PARAS, MD as PCP - General (Family Medicine) Caresse Cough, MD (Ophthalmology) Verta Royden DASEN, DPM as Consulting Physician (Podiatry) Mona, Vinie BROCKS, MD as Consulting Physician  (Cardiology)  I have updated  your Care Teams any recent Medical Services you may have received from other providers in the past year.     Assessment:   This is a routine wellness examination for [place].  Hearing/Vision screen Hearing Screening - Comments:: Denies hearing difficulties.  Vision Screening - Comments:: Wears rx glasses - up to date with routine eye exams with Dr. Donnice Mems. Due in September 2025.    Goals Addressed             This Visit's Progress    10/30/2023: To stay on my feet and stay healthy.         Depression Screen     10/30/2023    9:23 AM 10/12/2023    8:42 AM 04/17/2023   10:59 AM 12/14/2022   10:25 AM 12/14/2022   10:23 AM 10/28/2022    9:34 AM 08/16/2022   10:30 AM  PHQ 2/9 Scores  PHQ - 2 Score 0 0 0 0  0 0  PHQ- 9 Score 0 0 0 0  0 0  Exception Documentation     Patient refusal      Fall Risk     10/30/2023    9:19 AM 10/12/2023    8:34 AM 09/21/2023    9:55 AM 04/17/2023   10:59 AM 12/14/2022   10:23 AM  Fall Risk   Falls in the past year? 1 1 0 0 0  Number falls in past yr: 0 0 0 0 0  Injury with Fall? 1 1 0 0 0  Risk for fall due to :    No Fall Risks   Follow up Falls evaluation completed;Education provided   Falls evaluation completed     MEDICARE RISK AT HOME:  Medicare Risk at Home Any stairs in or around the home?: Yes If so, are there any without handrails?: No Home free of loose throw rugs in walkways, pet beds, electrical cords, etc?: Yes Adequate lighting in your home to reduce risk of falls?: Yes Life alert?: No Use of a cane, walker or w/c?: No Grab bars in the bathroom?: Yes Shower chair or bench in shower?: Yes (walk-in-shower with bench) Elevated toilet seat or a handicapped toilet?: Yes  TIMED UP AND GO:  Was the test performed?  No  Cognitive Function: Declined/Normal: No cognitive concerns noted by patient or family. Patient alert, oriented, able to answer questions appropriately and recall recent  events. No signs of memory loss or confusion.    10/30/2023    9:23 AM 04/23/2018    1:55 PM 07/02/2013    1:00 PM  MMSE - Mini Mental State Exam  Not completed: Unable to complete    Orientation to time  5 5   Orientation to Place  5 5   Registration  3 3   Attention/ Calculation  5 5   Recall  3 3   Language- name 2 objects  2 2   Language- repeat  1 1  Language- follow 3 step command  3 3   Language- read & follow direction  1 1   Write a sentence  1 1   Copy design  1 1   Total score  30 30      Data saved with a previous flowsheet row definition        10/30/2023    9:29 AM 10/28/2022    9:36 AM 02/09/2022    9:57 AM 04/23/2018    1:56 PM  6CIT Screen  What Year? 0 points 0  points 0 points 0 points  What month? 0 points 0 points 0 points 0 points  What time? 0 points 0 points 0 points 0 points  Count back from 20 0 points 0 points 0 points 0 points  Months in reverse 0 points 0 points 0 points 0 points  Repeat phrase 0 points 0 points 0 points 0 points  Total Score 0 points 0 points 0 points 0 points    Immunizations Immunization History  Administered Date(s) Administered   Fluad Trivalent(High Dose 65+) 01/24/2023   H1N1 02/25/2008   Influenza Split 12/06/2011   Influenza Whole 12/18/2006, 12/14/2007, 01/06/2009   Influenza,inj,Quad PF,6+ Mos 11/27/2012, 12/09/2013, 11/21/2014, 11/27/2015, 12/23/2016, 12/06/2017, 11/27/2018, 12/03/2019, 11/24/2020   Influenza-Unspecified 12/27/2018, 12/26/2021   PFIZER(Purple Top)SARS-COV-2 Vaccination 04/04/2019, 04/25/2019, 02/13/2020   Pneumococcal Conjugate-13 07/02/2013   Pneumococcal Polysaccharide-23 02/11/2001, 10/28/2014   Td 05/12/2004   Tdap 11/04/2014   Zoster, Live 08/09/2007    Screening Tests Health Maintenance  Topic Date Due   Zoster Vaccines- Shingrix  (1 of 2) 11/29/1964   FOOT EXAM  02/03/2021   COVID-19 Vaccine (4 - 2024-25 season) 11/13/2022   INFLUENZA VACCINE  10/13/2023   OPHTHALMOLOGY EXAM   10/19/2023   HEMOGLOBIN A1C  03/23/2024   Diabetic kidney evaluation - Urine ACR  04/16/2024   Diabetic kidney evaluation - eGFR measurement  09/14/2024   Medicare Annual Wellness (AWV)  10/29/2024   DTaP/Tdap/Td (3 - Td or Tdap) 11/03/2024   Pneumococcal Vaccine: 50+ Years  Completed   DEXA SCAN  Completed   Hepatitis C Screening  Completed   HPV VACCINES  Aged Out   Meningococcal B Vaccine  Aged Out   Pneumococcal Vaccine  Discontinued   Colonoscopy  Discontinued   Fecal DNA (Cologuard)  Discontinued    Health Maintenance  Health Maintenance Due  Topic Date Due   Zoster Vaccines- Shingrix  (1 of 2) 11/29/1964   FOOT EXAM  02/03/2021   COVID-19 Vaccine (4 - 2024-25 season) 11/13/2022   INFLUENZA VACCINE  10/13/2023   OPHTHALMOLOGY EXAM  10/19/2023   Health Maintenance Items Addressed: Yes Patient aware of current care gaps.  Patient is due for flu, shingrix , covid-19, eye exam and foot exam.  Additional Screening:  Vision Screening: Recommended annual ophthalmology exams for early detection of glaucoma and other disorders of the eye. Would you like a referral to an eye doctor? No    Dental Screening: Recommended annual dental exams for proper oral hygiene  Community Resource Referral / Chronic Care Management: CRR required this visit?  No   CCM required this visit?  No   Plan:    I have personally reviewed and noted the following in the patient's chart:   Medical and social history Use of alcohol, tobacco or illicit drugs  Current medications and supplements including opioid prescriptions. Patient is not currently taking opioid prescriptions. Functional ability and status Nutritional status Physical activity Advanced directives List of other physicians Hospitalizations, surgeries, and ER visits in previous 12 months Vitals Screenings to include cognitive, depression, and falls Referrals and appointments  In addition, I have reviewed and discussed with  patient certain preventive protocols, quality metrics, and best practice recommendations. A written personalized care plan for preventive services as well as general preventive health recommendations were provided to patient.   Tiffany LOISE Fuller, LPN   1/81/7974   After Visit Summary: (MyChart) Due to this being a telephonic visit, the after visit summary with patients personalized plan was offered to patient via MyChart  Notes: Patient aware of current care gaps.  Patient is due for flu, shingrix , covid-19, eye exam and foot exam.

## 2023-10-30 NOTE — Patient Instructions (Signed)
 Ms. Santore , Thank you for taking time out of your busy schedule to complete your Annual Wellness Visit with me. I enjoyed our conversation and look forward to speaking with you again next year. I, as well as your care team,  appreciate your ongoing commitment to your health goals. Please review the following plan we discussed and let me know if I can assist you in the future. Your Game plan/ To Do List    Referrals: If you haven't heard from the office you've been referred to, please reach out to them at the phone provided.   Follow up Visits: We will see or speak with you next year for your Next Medicare AWV with our clinical staff Have you seen your provider in the last 6 months (3 months if uncontrolled diabetes)? Yes  Clinician Recommendations:  Aim for 30 minutes of exercise or brisk walking, 6-8 glasses of water, and 5 servings of fruits and vegetables each day.       This is a list of the screenings recommended for you:  Health Maintenance  Topic Date Due   Zoster (Shingles) Vaccine (1 of 2) 11/29/1964   Complete foot exam   02/03/2021   COVID-19 Vaccine (4 - 2024-25 season) 11/13/2022   Flu Shot  10/13/2023   Eye exam for diabetics  10/19/2023   Hemoglobin A1C  03/23/2024   Yearly kidney health urinalysis for diabetes  04/16/2024   Yearly kidney function blood test for diabetes  09/14/2024   Medicare Annual Wellness Visit  10/29/2024   DTaP/Tdap/Td vaccine (3 - Td or Tdap) 11/03/2024   Pneumococcal Vaccine for age over 4  Completed   DEXA scan (bone density measurement)  Completed   Hepatitis C Screening  Completed   HPV Vaccine  Aged Out   Meningitis B Vaccine  Aged Out   Pneumococcal Vaccine  Discontinued   Colon Cancer Screening  Discontinued   Cologuard (Stool DNA test)  Discontinued    Advanced directives: (Copy Requested) Please bring a copy of your health care power of attorney and living will to the office to be added to your chart at your convenience. You can  mail to [facility] [address] [place], [place] [address] or email to [email or web address] Advance Care Planning is important because it:  [x]  Makes sure you receive the medical care that is consistent with your values, goals, and preferences  [x]  It provides guidance to your family and loved ones and reduces their decisional burden about whether or not they are making the right decisions based on your wishes.  Follow the link provided in your after visit summary or read over the paperwork we have mailed to you to help you started getting your Advance Directives in place. If you need assistance in completing these, please reach out to us  so that we can help you!  See attachments for Preventive Care and Fall Prevention Tips.

## 2023-11-06 ENCOUNTER — Ambulatory Visit (INDEPENDENT_AMBULATORY_CARE_PROVIDER_SITE_OTHER): Admitting: Family Medicine

## 2023-11-06 VITALS — BP 135/68 | HR 80 | Ht 69.0 in | Wt 194.0 lb

## 2023-11-06 DIAGNOSIS — R300 Dysuria: Secondary | ICD-10-CM | POA: Diagnosis not present

## 2023-11-06 DIAGNOSIS — N390 Urinary tract infection, site not specified: Secondary | ICD-10-CM | POA: Diagnosis not present

## 2023-11-06 LAB — POCT URINALYSIS DIP (MANUAL ENTRY)
Bilirubin, UA: NEGATIVE
Blood, UA: NEGATIVE
Glucose, UA: NEGATIVE mg/dL
Nitrite, UA: NEGATIVE
Protein Ur, POC: NEGATIVE mg/dL
Spec Grav, UA: 1.02 (ref 1.010–1.025)
Urobilinogen, UA: 1 U/dL
pH, UA: 6.5 (ref 5.0–8.0)

## 2023-11-06 LAB — POCT UA - MICROSCOPIC ONLY
RBC, Urine, Miroscopic: NONE SEEN (ref 0–2)
WBC, Ur, HPF, POC: >20 (ref 0–5)

## 2023-11-06 MED ORDER — SULFAMETHOXAZOLE-TRIMETHOPRIM 800-160 MG PO TABS: 1.0000 | ORAL_TABLET | Freq: Two times a day (BID) | ORAL | 0 refills | Status: DC

## 2023-11-06 NOTE — Patient Instructions (Signed)
 I sent in Bactrim  to take for 7 days. We will await the urine culture.  You can take Azo at home as prescribed on the box.  We will get you an updated renal ultrasound to further evaluate for kidney issues.  I recommend returning to care should you have fever, worsening pain, nausea/vomiting, or overall not improving.

## 2023-11-06 NOTE — Progress Notes (Signed)
    SUBJECTIVE:   CHIEF COMPLAINT / HPI:   UTI symptoms Has a lot going on with recent family deaths. Her symptoms improved since last time, but they have returned. She now has symptoms of pulling of the urethra. She feels she has bowel urgency, and she feels that this could be contributing to her urinary symptoms. She has also had some jabbing pains in the R kidney. No fevers but overall has not felt well in the last couple days. She has an appointment with her urologist at the end of September 2025. She has tried pelvic floor PT in the past, as well, which did not help much.  PERTINENT  PMH / PSH: Recurrent UTI  OBJECTIVE:   BP 135/68   Pulse 80   Ht 5' 9 (1.753 m)   Wt 194 lb (88 kg)   SpO2 98%   BMI 28.65 kg/m   General: Alert and oriented, in NAD Skin: Warm, dry, and intact  HEENT: NCAT, EOM grossly normal, midline nasal septum Cardiac: RRR, no m/r/g appreciated Respiratory: CTAB, breathing and speaking comfortably on RA Abdominal: Soft, mildly tender in suprapubic area, nondistended, normoactive bowel sounds Extremities: Moves all extremities grossly equally Neurological: No gross focal deficit Psychiatric: Appropriate mood and affect   ASSESSMENT/PLAN:   Assessment & Plan Recurrent UTI (urinary tract infection) Patient again with UA with 2+ leukocytes along with characteristic symptoms with her history.  Previous CTAP with overall normal morphology of urinary tract; however, we will update a renal ultrasound today to further evaluate.  I have sent in a course of Bactrim  given her previous urine culture sensitivity to this.  We will collect another culture today and adjust antibiotics as needed.  Return precautions discussed for evolving pyelonephritis.  She also has follow-up with her urologist next month.  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  Stuart Redo, MD [facility]

## 2023-11-09 ENCOUNTER — Ambulatory Visit (HOSPITAL_COMMUNITY)
Admission: RE | Admit: 2023-11-09 | Discharge: 2023-11-09 | Disposition: A | Discharge status: Home / Self Care | Source: Ambulatory Visit | Attending: Family Medicine | Admitting: Family Medicine

## 2023-11-09 DIAGNOSIS — N390 Urinary tract infection, site not specified: Secondary | ICD-10-CM | POA: Insufficient documentation

## 2023-11-10 ENCOUNTER — Ambulatory Visit: Payer: Self-pay | Admitting: Family Medicine

## 2023-11-11 LAB — URINE CULTURE

## 2023-11-14 ENCOUNTER — Other Ambulatory Visit: Payer: Self-pay | Admitting: Family Medicine

## 2023-11-15 DIAGNOSIS — B0239 Other herpes zoster eye disease: Secondary | ICD-10-CM | POA: Diagnosis not present

## 2023-11-15 DIAGNOSIS — H40003 Preglaucoma, unspecified, bilateral: Secondary | ICD-10-CM | POA: Diagnosis not present

## 2023-11-15 DIAGNOSIS — Z961 Presence of intraocular lens: Secondary | ICD-10-CM | POA: Diagnosis not present

## 2023-11-15 DIAGNOSIS — E119 Type 2 diabetes mellitus without complications: Secondary | ICD-10-CM | POA: Diagnosis not present

## 2023-11-16 ENCOUNTER — Other Ambulatory Visit: Payer: Self-pay

## 2023-11-16 DIAGNOSIS — I10 Essential (primary) hypertension: Secondary | ICD-10-CM

## 2023-11-17 ENCOUNTER — Other Ambulatory Visit: Payer: Self-pay

## 2023-11-17 DIAGNOSIS — I10 Essential (primary) hypertension: Secondary | ICD-10-CM

## 2023-11-20 ENCOUNTER — Ambulatory Visit (HOSPITAL_COMMUNITY)
Admission: RE | Admit: 2023-11-20 | Discharge: 2023-11-20 | Disposition: A | Discharge status: Home / Self Care | Source: Ambulatory Visit | Attending: Internal Medicine | Admitting: Internal Medicine

## 2023-11-20 ENCOUNTER — Encounter (HOSPITAL_COMMUNITY): Payer: Self-pay | Admitting: Internal Medicine

## 2023-11-20 VITALS — BP 124/60 | HR 77 | Ht 69.0 in | Wt 192.4 lb

## 2023-11-20 DIAGNOSIS — D6869 Other thrombophilia: Secondary | ICD-10-CM | POA: Diagnosis not present

## 2023-11-20 DIAGNOSIS — I480 Paroxysmal atrial fibrillation: Secondary | ICD-10-CM | POA: Diagnosis not present

## 2023-11-20 MED ORDER — CARVEDILOL 3.125 MG PO TABS: 3.1250 mg | ORAL_TABLET | Freq: Two times a day (BID) | ORAL | 1 refills | Status: DC

## 2023-11-20 MED ORDER — DILTIAZEM HCL 30 MG PO TABS: 30.0000 mg | ORAL_TABLET | Freq: Three times a day (TID) | ORAL | 2 refills | Status: AC | PRN

## 2023-11-20 NOTE — Progress Notes (Signed)
 Primary Care Physician: Donah Laymon PARAS, MD Primary Cardiologist: Dr. Mona for dyslipidemia Primary Electrophysiologist: None Referring Physician: [facility]   Tiffany Becker is a 78 y.o. female with a history of elevated coronary calcium  score - 724 (93rd percentile), HLD, HTN, T2DM, osteoarthritis, atrial fibrillation who presents for consultation in the [facility]. The patient was initially diagnosed with atrial fibrillation on 08/01/22 after presenting to [facility] ED in Afib with RVR. She converted to NSR after cardizem  and IV fluids by EMS. Given diltiazem  30 mg prn. Patient is on Eliquis  5 mg BID for a CHADS2VASC score of 6.  On evaluation today, she is currently in NSR. She notes that the day before ED visit she overexerted herself cooking for a family reunion. She notes may have had Afib 5 years ago due to similar symptoms; occurred after she helped her brother move. She has a BP cuff at home which alerts her when she possibly has an abnormal rhythm. She feels when she is in Afib due to palpitations.  She does not drink alcohol. She drinks 1-2 cups of coffee daily. She believes she does snore and at least historically felt like she stopped breathing at night.  She is compliant with anticoagulation and has not missed any doses. She has no bleeding concerns.  On follow up 09/07/22, she is currently in NSR. Since last office visit, she thinks she has had multiple episodes of Afib. Her BP cuff will alert her if there is an irregular heart beat and she has had that intermittently since last office visit. However, she only sometimes will feel the palpitations she did previously and notes it tends to happen in the afternoon. No missed doses of Eliquis .   On follow up 10/13/22, she is currently in NSR. Cardiac monitor worn 6/26-7/3 did not show any episodes of Afib. She has not had any episodes of Afib since last office visit. She received her CPAP equipment  2 days ago and is still getting used to it. She will sporadically take celebrex  prn arthritic pain. No bleeding issues on Eliquis .   On follow up 05/18/23, she is currently in NSR. She was in the ED on 2/10 for Afib and spontaneously converted to NSR after taking diltiazem  30 mg dose. She is on coreg  3.125 mg BID. She did note one other evening where she could feel as though she might go into Afib and took diltiazem  30 mg. She does note difficulty sleeping a lot of nights. No missed doses of Eliquis  5 mg BID.   On follow up 11/20/23, patient is currently in NSR. She notes overall low Afib burden since last office visit. No bleeding issues on Eliquis .  Today, she denies symptoms of chest pain, shortness of breath, orthopnea, PND, lower extremity edema, dizziness, presyncope, syncope, snoring, daytime somnolence, bleeding, or neurologic sequela. The patient is tolerating medications without difficulties and is otherwise without complaint today.   Atrial Fibrillation Risk Factors:  she does have symptoms or diagnosis of sleep apnea. she does not have a history of rheumatic fever. she does not have a history of alcohol use. The patient does not have a history of early familial atrial fibrillation or other arrhythmias.  she has a BMI of Body mass index is 28.41 kg/m.SABRA Filed Weights   11/20/23 1319  Weight: 87.3 kg     Family History  Problem Relation Age of Onset   COPD Mother    Osteoarthritis Mother  Asthma Mother    Heart disease Mother    Coronary artery disease Father 60       died age 45   Heart disease Father    Stroke Father    Diabetes Brother        bladder, pacemaker, Hemochromatosis   Cancer Brother 40       Bladder    Atrial Fibrillation Management history:  Previous antiarrhythmic drugs: None Previous cardioversions: None Previous ablations: None Anticoagulation history: Eliquis  5 mg BID   Past Medical History:  Diagnosis Date   Abnormal renal ultrasound  04/25/2002   Atrial fibrillation (HCC)    Diabetes mellitus without complication (HCC)    Diverticulosis of colon 08/12/2004   GERD (gastroesophageal reflux disease)    H pylori neg 08/21/02   Hypertension    Peptic ulcer disease 03/14/1980   EGD   Primary osteoarthritis of both hips 06/13/2007   Dr Liam   Pyelonephritis 01/12/2001   Vitreous detachment    Past Surgical History:  Procedure Laterality Date   BREAST REDUCTION SURGERY  03/14/93   BUNIONECTOMY  12/09   L with 2nd and 5th toe straightening   CERVICAL DISCECTOMY  03/14/06   C6-7   CHOLECYSTECTOMY  01/12/89   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   SALPINGOOPHORECTOMY  09/11/97   ruptured cyst   TONSILLECTOMY  03/14/52   TOTAL ABDOMINAL HYSTERECTOMY  01/12/89   L salpingoophorectomy for ruptured cyst   TUBAL LIGATION  03/14/72   UMBILICAL HERNIA REPAIR  03/14/64    Current Outpatient Medications  Medication Sig Dispense Refill   amLODipine  (NORVASC ) 5 MG tablet TAKE 1 TABLET BY MOUTH TWICE  DAILY IN THE MORNING AND AT  BEDTIME 200 tablet 2   apixaban  (ELIQUIS ) 5 MG TABS tablet TAKE 1 TABLET BY MOUTH TWICE  DAILY 200 tablet 1   carvedilol  (COREG ) 3.125 MG tablet Take 1 tablet (3.125 mg total) by mouth 2 (two) times daily with a meal. 180 tablet 1   COENZYME Q-10 PO Take by mouth.     glipiZIDE  (GLUCOTROL  XL) 2.5 MG 24 hr tablet TAKE 1 TABLET BY MOUTH DAILY  WITH BREAKFAST 100 tablet 1   lisinopril  (ZESTRIL ) 40 MG tablet TAKE 1 TABLET BY MOUTH DAILY 100 tablet 1   metFORMIN  (GLUCOPHAGE ) 1000 MG tablet TAKE 1 TABLET BY MOUTH TWICE  DAILY WITH MEALS 200 tablet 2   rosuvastatin  (CRESTOR ) 5 MG tablet TAKE 1 TABLET BY MOUTH DAILY 100 tablet 2   diltiazem  (CARDIZEM ) 30 MG tablet Take 1 tablet (30 mg total) by mouth 3 (three) times daily as needed (for heart rate >110). 45 tablet 2   No current facility-administered medications for this encounter.    Allergies  Allergen Reactions   Statins Other (See Comments)    Simvastatin: Hair  loss and leg pain Atorvastatin : Hair Loss Pravasatin: Myalgia Rosuvastatin : High doses lead to myalgia/joint pain    ROS- All systems are reviewed and negative except as per the HPI above.  Physical Exam: Vitals:   11/20/23 1319  BP: 124/60  Pulse: 77  Weight: 87.3 kg  Height: 5' 9 (1.753 m)    GEN- The patient is well appearing, alert and oriented x 3 today.   Neck - no JVD or carotid bruit noted Lungs- Clear to ausculation bilaterally, normal work of breathing Heart- Regular rate and rhythm, no murmurs, rubs or gallops, PMI not laterally displaced Extremities- no clubbing, cyanosis, or edema Skin - no rash or ecchymosis noted   Wt  Readings from Last 3 Encounters:  11/20/23 87.3 kg  11/06/23 88 kg  10/30/23 87.1 kg   EKG today demonstrates  Vent. rate 77 BPM PR interval 140 ms QRS duration 76 ms QT/QTcB 356/402 ms P-R-T axes 63 40 59 Normal sinus rhythm Normal ECG When compared with ECG of 15-Sep-2023 08:54, PREVIOUS ECG IS PRESENT  Echo 08/25/22: 1. Left ventricular ejection fraction, by estimation, is 60 to 65%. The  left ventricle has normal function. The left ventricle has no regional  wall motion abnormalities. Left ventricular diastolic parameters were  normal.   2. Right ventricular systolic function is normal. The right ventricular  size is normal. Tricuspid regurgitation signal is inadequate for assessing  PA pressure.   3. The mitral valve is normal in structure. Trivial mitral valve  regurgitation. No evidence of mitral stenosis.   4. The aortic valve is normal in structure. Aortic valve regurgitation is  not visualized. No aortic stenosis is present.   5. The inferior vena cava is normal in size with greater than 50%  respiratory variability, suggesting right atrial pressure of 3 mmHg.   Cardiac monitor 6/26-09/14/2022: Patch Wear Time:  6 days and 17 hours    Predominant rhythm was sinus rhythm Patient had a min HR of 49 bpm, max HR of 188 bpm,  and avg HR of 69 bpm. 18 SVT episodes, all less than 16 seconds Less than 1% ventricular and supraventricular ectopy No patient triggered episodes recorded   Will Camnitz, MD  Epic records are reviewed at length today.  CHA2DS2-VASc Score = 6  The patient's score is based upon: CHF History: 0 HTN History: 1 Diabetes History: 1 Stroke History: 0 Vascular Disease History: 1 Age Score: 2 Gender Score: 1       ASSESSMENT AND PLAN: Paroxysmal Atrial Fibrillation (ICD10:  I48.0) The patient's CHA2DS2-VASc score is 6, indicating a 9.7% annual risk of stroke.    Patient is currently in NSR. She is doing well from an Afib standpoint. Continue coreg  3.125 mg BID.   Secondary Hypercoagulable State (ICD10:  D68.69) The patient is at significant risk for stroke/thromboembolism based upon her CHA2DS2-VASc Score of 6.  Continue Apixaban  (Eliquis ).  Continue Eliquis .   HTN Stable today.    Follow up 6 months Afib clinic.   Fairy Heinrich, PA-C Afib Clinic [facility] [address] [place], [place] [address] [phone] 11/20/2023 1:47 PM

## 2023-12-26 ENCOUNTER — Ambulatory Visit

## 2023-12-26 DIAGNOSIS — Z23 Encounter for immunization: Secondary | ICD-10-CM

## 2024-01-05 ENCOUNTER — Other Ambulatory Visit: Payer: Self-pay | Admitting: *Deleted

## 2024-01-05 DIAGNOSIS — E785 Hyperlipidemia, unspecified: Secondary | ICD-10-CM

## 2024-01-25 LAB — HM MAMMOGRAPHY

## 2024-02-13 ENCOUNTER — Encounter (HOSPITAL_COMMUNITY): Payer: Self-pay

## 2024-02-13 ENCOUNTER — Emergency Department (HOSPITAL_COMMUNITY)
Admission: EM | Admit: 2024-02-13 | Discharge: 2024-02-13 | Disposition: A | Discharge status: Home / Self Care | Attending: Emergency Medicine | Admitting: Emergency Medicine

## 2024-02-13 ENCOUNTER — Emergency Department (HOSPITAL_COMMUNITY)

## 2024-02-13 ENCOUNTER — Other Ambulatory Visit: Payer: Self-pay

## 2024-02-13 DIAGNOSIS — R002 Palpitations: Secondary | ICD-10-CM | POA: Diagnosis present

## 2024-02-13 DIAGNOSIS — Z7901 Long term (current) use of anticoagulants: Secondary | ICD-10-CM | POA: Insufficient documentation

## 2024-02-13 DIAGNOSIS — N3289 Other specified disorders of bladder: Secondary | ICD-10-CM | POA: Diagnosis not present

## 2024-02-13 DIAGNOSIS — I482 Chronic atrial fibrillation, unspecified: Secondary | ICD-10-CM | POA: Insufficient documentation

## 2024-02-13 DIAGNOSIS — N329 Bladder disorder, unspecified: Secondary | ICD-10-CM

## 2024-02-13 LAB — BASIC METABOLIC PANEL WITH GFR
Anion gap: 12 (ref 5–15)
BUN: 13 mg/dL (ref 8–23)
CO2: 23 mmol/L (ref 22–32)
Calcium: 9.3 mg/dL (ref 8.9–10.3)
Chloride: 100 mmol/L (ref 98–111)
Creatinine, Ser: 0.73 mg/dL (ref 0.44–1.00)
GFR, Estimated: >60 mL/min (ref >60)
Glucose, Bld: 167 mg/dL — ABNORMAL HIGH (ref 70–99)
Potassium: 4 mmol/L (ref 3.5–5.1)
Sodium: 135 mmol/L (ref 135–145)

## 2024-02-13 LAB — CBC WITH DIFFERENTIAL/PLATELET
Abs Immature Granulocytes: 0.02 K/uL (ref 0.00–0.07)
Basophils Absolute: 0.1 K/uL (ref 0.0–0.1)
Basophils Relative: 1 %
Eosinophils Absolute: 0.2 K/uL (ref 0.0–0.5)
Eosinophils Relative: 2 %
HCT: 40.3 % (ref 36.0–46.0)
Hemoglobin: 13.7 g/dL (ref 12.0–15.0)
Immature Granulocytes: 0 %
Lymphocytes Relative: 29 %
Lymphs Abs: 2.5 K/uL (ref 0.7–4.0)
MCH: 31.1 pg (ref 26.0–34.0)
MCHC: 34 g/dL (ref 30.0–36.0)
MCV: 91.4 fL (ref 80.0–100.0)
Monocytes Absolute: 0.8 K/uL (ref 0.1–1.0)
Monocytes Relative: 9 %
Neutro Abs: 5 K/uL (ref 1.7–7.7)
Neutrophils Relative %: 59 %
Platelets: 216 K/uL (ref 150–400)
RBC: 4.41 MIL/uL (ref 3.87–5.11)
RDW: 12.5 % (ref 11.5–15.5)
WBC: 8.4 K/uL (ref 4.0–10.5)
nRBC: 0 % (ref 0.0–0.2)

## 2024-02-13 LAB — URINALYSIS, ROUTINE W REFLEX MICROSCOPIC
Bilirubin Urine: NEGATIVE
Glucose, UA: NEGATIVE mg/dL
Hgb urine dipstick: NEGATIVE
Ketones, ur: NEGATIVE mg/dL
Leukocytes,Ua: NEGATIVE
Nitrite: NEGATIVE
Protein, ur: NEGATIVE mg/dL
Specific Gravity, Urine: 1.003 — ABNORMAL LOW (ref 1.005–1.030)
pH: 7 (ref 5.0–8.0)

## 2024-02-13 NOTE — ED Triage Notes (Signed)
 Pt BIB [facility] from home d/t A-FIB with RVR.  Pt took 30 mg (PRN dose) Cardizem  at 2300 but did not help.  Pt also flank pain - pt states she has with UTI at times.  EMS gave 500cc NS  BP 150/72 HR 72 CBG 138 18 LAC

## 2024-02-13 NOTE — ED Notes (Signed)
 All Triage was completed by Myself, Ronal Dawn, RN

## 2024-02-13 NOTE — ED Provider Notes (Signed)
 [place] EMERGENCY DEPARTMENT AT [facility] Provider Note   CSN: [id number] Arrival date & time: 02/13/24  9786     Patient presents with: Irregular Heart Beat   Tiffany Becker is a 78 y.o. female.   The history is provided by the patient.  Palpitations Palpitations quality:  Irregular Onset quality:  Sudden Timing:  Constant Progression:  Resolved Chronicity:  Recurrent Context: not nicotine and not stimulant use   Relieved by:  Nothing Worsened by:  Nothing Ineffective treatments:  None tried Associated symptoms: no chest pain and no shortness of breath   Risk factors: no diabetes mellitus and no hx of PE   Patient with AFIB on Eliquis  presents with reports of AFIB at home.  Took her regular coreg  without relief then took he Diltiazem  that she uses as a rescue with cessation of symptoms.  Also has flank pain and urinary symptoms.     Prior to Admission medications   Medication Sig Start Date End Date Taking? Authorizing Provider  amLODipine  (NORVASC ) 5 MG tablet TAKE 1 TABLET BY MOUTH TWICE  DAILY IN THE MORNING AND AT  BEDTIME 07/19/23   Donah Laymon PARAS, MD  apixaban  (ELIQUIS ) 5 MG TABS tablet TAKE 1 TABLET BY MOUTH TWICE  DAILY 08/08/23   Hilty, Vinie BROCKS, MD  carvedilol  (COREG ) 3.125 MG tablet Take 1 tablet (3.125 mg total) by mouth 2 (two) times daily with a meal. 11/20/23   Donah Laymon PARAS, MD  COENZYME Q-10 PO Take by mouth.    [provider]  diltiazem  (CARDIZEM ) 30 MG tablet Take 1 tablet (30 mg total) by mouth 3 (three) times daily as needed (for heart rate >110). 11/20/23   Terra Fairy PARAS, PA-C  glipiZIDE  (GLUCOTROL  XL) 2.5 MG 24 hr tablet TAKE 1 TABLET BY MOUTH DAILY  WITH BREAKFAST 11/17/23   Donah Laymon PARAS, MD  lisinopril  (ZESTRIL ) 40 MG tablet TAKE 1 TABLET BY MOUTH DAILY 11/17/23   Donah Laymon PARAS, MD  metFORMIN  (GLUCOPHAGE ) 1000 MG tablet TAKE 1 TABLET BY MOUTH TWICE  DAILY WITH MEALS 04/05/23   Donah Laymon PARAS, MD   rosuvastatin  (CRESTOR ) 5 MG tablet TAKE 1 TABLET BY MOUTH DAILY 10/03/23   Hilty, Vinie BROCKS, MD    Allergies: Statins    Review of Systems  Constitutional:  Negative for fever.  Respiratory:  Negative for shortness of breath, wheezing and stridor.   Cardiovascular:  Positive for palpitations. Negative for chest pain and leg swelling.  Gastrointestinal:  Negative for abdominal pain.  Genitourinary:  Positive for flank pain.    Updated Vital Signs BP (!) 110/52   Pulse (!) 52   Temp 98 F (36.7 C) (Oral)   Resp 14   Ht 5' 9 (1.753 m)   Wt 87.1 kg   SpO2 99%   BMI 28.35 kg/m   Physical Exam Vitals and nursing note reviewed.  Constitutional:      General: She is not in acute distress.    Appearance: Normal appearance. She is well-developed.  HENT:     Head: Normocephalic and atraumatic.     Nose: Nose normal.  Eyes:     Pupils: Pupils are equal, round, and reactive to light.  Cardiovascular:     Rate and Rhythm: Normal rate and regular rhythm.     Pulses: Normal pulses.     Heart sounds: Normal heart sounds.  Pulmonary:     Effort: Pulmonary effort is normal. No respiratory distress.     Breath  sounds: Normal breath sounds.  Abdominal:     General: Bowel sounds are normal. There is no distension.     Palpations: Abdomen is soft.     Tenderness: There is no abdominal tenderness. There is no guarding or rebound.  Musculoskeletal:        General: Normal range of motion.     Cervical back: Normal range of motion and neck supple.  Skin:    General: Skin is warm and dry.     Capillary Refill: Capillary refill takes less than 2 seconds.     Findings: No erythema or rash.  Neurological:     General: No focal deficit present.     Mental Status: She is alert and oriented to person, place, and time.     Deep Tendon Reflexes: Reflexes normal.  Psychiatric:        Mood and Affect: Mood normal.     (all labs ordered are listed, but only abnormal results are  displayed) Results for orders placed or performed during the hospital encounter of 02/13/24  CBC with Differential   Collection Time: 02/13/24  2:44 AM  Result Value Ref Range   WBC 8.4 4.0 - 10.5 K/uL   RBC 4.41 3.87 - 5.11 MIL/uL   Hemoglobin 13.7 12.0 - 15.0 g/dL   HCT 59.6 63.9 - 53.9 %   MCV 91.4 80.0 - 100.0 fL   MCH 31.1 26.0 - 34.0 pg   MCHC 34.0 30.0 - 36.0 g/dL   RDW 87.4 88.4 - 84.4 %   Platelets 216 150 - 400 K/uL   nRBC 0.0 0.0 - 0.2 %   Neutrophils Relative % 59 %   Neutro Abs 5.0 1.7 - 7.7 K/uL   Lymphocytes Relative 29 %   Lymphs Abs 2.5 0.7 - 4.0 K/uL   Monocytes Relative 9 %   Monocytes Absolute 0.8 0.1 - 1.0 K/uL   Eosinophils Relative 2 %   Eosinophils Absolute 0.2 0.0 - 0.5 K/uL   Basophils Relative 1 %   Basophils Absolute 0.1 0.0 - 0.1 K/uL   Immature Granulocytes 0 %   Abs Immature Granulocytes 0.02 0.00 - 0.07 K/uL  Basic metabolic panel   Collection Time: 02/13/24  2:44 AM  Result Value Ref Range   Sodium 135 135 - 145 mmol/L   Potassium 4.0 3.5 - 5.1 mmol/L   Chloride 100 98 - 111 mmol/L   CO2 23 22 - 32 mmol/L   Glucose, Bld 167 (H) 70 - 99 mg/dL   BUN 13 8 - 23 mg/dL   Creatinine, Ser 9.26 0.44 - 1.00 mg/dL   Calcium  9.3 8.9 - 10.3 mg/dL   GFR, Estimated >39 >39 mL/min   Anion gap 12 5 - 15  Urinalysis, Routine w reflex microscopic -Urine, Clean Catch   Collection Time: 02/13/24  2:44 AM  Result Value Ref Range   Color, Urine COLORLESS (A) YELLOW   APPearance CLEAR CLEAR   Specific Gravity, Urine 1.003 (L) 1.005 - 1.030   pH 7.0 5.0 - 8.0   Glucose, UA NEGATIVE NEGATIVE mg/dL   Hgb urine dipstick NEGATIVE NEGATIVE   Bilirubin Urine NEGATIVE NEGATIVE   Ketones, ur NEGATIVE NEGATIVE mg/dL   Protein, ur NEGATIVE NEGATIVE mg/dL   Nitrite NEGATIVE NEGATIVE   Leukocytes,Ua NEGATIVE NEGATIVE   CT Renal Stone Study Result Date: 02/13/2024 EXAM: CT UROGRAM 02/13/2024 03:59:04 AM TECHNIQUE: CT of the abdomen and pelvis was performed without  the administration of intravenous contrast. Multiplanar reformatted images as well  as MIP urogram images are provided for review. Automated exposure control, iterative reconstruction, and/or weight based adjustment of the mA/kV was utilized to reduce the radiation dose to as low as reasonably achievable. COMPARISON: CT abdomen and pelvis 09/15/2023. CLINICAL HISTORY: 78 year old female. Abdominal/flank pain, stone suspected. FINDINGS: LOWER CHEST: Dependent atelectasis, otherwise negative lung bases. LIVER: Stable and negative non-contrast liver. GALLBLADDER AND BILE DUCTS: Chronic cholecystectomy. No biliary ductal dilatation. SPLEEN: No acute abnormality. PANCREAS: No acute abnormality. ADRENAL GLANDS: No acute abnormality. KIDNEYS, URETERS AND BLADDER: No urinary calculus. Both ureters appear decompressed and normal to the bladder. No hydronephrosis. No perinephric or periureteral stranding. On sagittal image 107 there is a new rounded 10 mm intermediate density soft tissue mass which appears associated with the wall of the anterior bladder dome. Otherwise the urinary bladder appears negative. Direct visualization with cystoscopy likely would be most valuable. GI AND BOWEL: Stomach and duodenum decompressed. Decompressed large bowel from the splenic flexure distally. Moderate sigmoid colon diverticulosis with no active inflammation. Mild upstream large bowel retained stool. Diminutive and normal appendix arising from the cecum on coronal image 68. Decompressed terminal ileum. Nondilated small bowel. PERITONEUM AND RETROPERITONEUM: New pelvis free fluid. No free air. VASCULATURE: Aorta is normal in caliber. Moderate calcified abdominal aortic atherosclerosis. LYMPH NODES: No lymphadenopathy. REPRODUCTIVE ORGANS: Chronically absent uterus, diminutive or absent ovaries. BONES AND SOFT TISSUES: Hyperostosis in the visible lower thoracic spine with some levels of interbody ankylosis. Chronic lumbar facet arthropathy  superimposed on multilevel mild grade 1 spondylolisthesis. No acute osseous abnormality. Numerous chronic pelvic phleboliths. Stable chronic postoperative changes to the ventral abdominal wall. No focal soft tissue abnormality. IMPRESSION: 1. New 10 mm soft tissue nodule/mass associated with the anterior bladder dome wall (sagittal image 107). Recommend Urology follow-up, consideration of cystoscopy. 2. No nephrolithiasis or obstructive uropathy. 3. Moderate sigmoid colon diverticulosis without evidence of diverticulitis. Electronically signed by: Helayne Hurst MD 02/13/2024 04:10 AM EST RP Workstation: [id number]     Date: 02/13/2024  Rate: 67  Rhythm: normal sinus rhythm  QRS Axis: normal  Intervals: normal  ST/T Wave abnormalities: normal  Conduction Disutrbances: none  Narrative Interpretation: unremarkable  ;   Radiology: CT Renal Stone Study Result Date: 02/13/2024 EXAM: CT UROGRAM 02/13/2024 03:59:04 AM TECHNIQUE: CT of the abdomen and pelvis was performed without the administration of intravenous contrast. Multiplanar reformatted images as well as MIP urogram images are provided for review. Automated exposure control, iterative reconstruction, and/or weight based adjustment of the mA/kV was utilized to reduce the radiation dose to as low as reasonably achievable. COMPARISON: CT abdomen and pelvis 09/15/2023. CLINICAL HISTORY: 78 year old female. Abdominal/flank pain, stone suspected. FINDINGS: LOWER CHEST: Dependent atelectasis, otherwise negative lung bases. LIVER: Stable and negative non-contrast liver. GALLBLADDER AND BILE DUCTS: Chronic cholecystectomy. No biliary ductal dilatation. SPLEEN: No acute abnormality. PANCREAS: No acute abnormality. ADRENAL GLANDS: No acute abnormality. KIDNEYS, URETERS AND BLADDER: No urinary calculus. Both ureters appear decompressed and normal to the bladder. No hydronephrosis. No perinephric or periureteral stranding. On sagittal image 107 there is a new  rounded 10 mm intermediate density soft tissue mass which appears associated with the wall of the anterior bladder dome. Otherwise the urinary bladder appears negative. Direct visualization with cystoscopy likely would be most valuable. GI AND BOWEL: Stomach and duodenum decompressed. Decompressed large bowel from the splenic flexure distally. Moderate sigmoid colon diverticulosis with no active inflammation. Mild upstream large bowel retained stool. Diminutive and normal appendix arising from the cecum on coronal  image 68. Decompressed terminal ileum. Nondilated small bowel. PERITONEUM AND RETROPERITONEUM: New pelvis free fluid. No free air. VASCULATURE: Aorta is normal in caliber. Moderate calcified abdominal aortic atherosclerosis. LYMPH NODES: No lymphadenopathy. REPRODUCTIVE ORGANS: Chronically absent uterus, diminutive or absent ovaries. BONES AND SOFT TISSUES: Hyperostosis in the visible lower thoracic spine with some levels of interbody ankylosis. Chronic lumbar facet arthropathy superimposed on multilevel mild grade 1 spondylolisthesis. No acute osseous abnormality. Numerous chronic pelvic phleboliths. Stable chronic postoperative changes to the ventral abdominal wall. No focal soft tissue abnormality. IMPRESSION: 1. New 10 mm soft tissue nodule/mass associated with the anterior bladder dome wall (sagittal image 107). Recommend Urology follow-up, consideration of cystoscopy. 2. No nephrolithiasis or obstructive uropathy. 3. Moderate sigmoid colon diverticulosis without evidence of diverticulitis. Electronically signed by: Helayne Hurst MD 02/13/2024 04:10 AM EST RP Workstation: [id number]     Procedures   Medications Ordered in the ED - No data to display                                  Medical Decision Making Patient with AFIB on eliquis  called out for AFIB  Amount and/or Complexity of Data Reviewed Independent Historian: EMS    Details: See above  External Data Reviewed: notes.     Details: Previous notes reviewed  Labs: ordered.    Details: Urine is negative for UTI. Normal sodium 135, normal potassium 4, normal creatinine 0.73 normal white count normal hemoglobin 13.7 Radiology: ordered and independent interpretation performed.    Details: No stones by me   Risk Risk Details: Patient without ectopy on the monitor. No signs of AFIB.  Patient is anticoagulated on Eliquis  and has been in the 60s the entire time.  No CP no SOB.   Patient informed of bladder lesion and need  for close follow up with urology.  Patient verbalizes understanding and agrees to follow up.       Final diagnoses:  Palpitations  Lesion of bladder  No signs of systemic illness or infection. The patient is nontoxic-appearing on exam and vital signs are within normal limits.  I have reviewed the triage vital signs and the nursing notes. Pertinent labs & imaging results that were available during my care of the patient were reviewed by me and considered in my medical decision making (see chart for details). After history, exam, and medical workup I feel the patient has been appropriately medically screened and is safe for discharge home. Pertinent diagnoses were discussed with the patient. Patient was given return precautions.     ED Discharge Orders     None          Teigan Sahli, MD 02/13/24 [phone]

## 2024-03-18 ENCOUNTER — Other Ambulatory Visit: Payer: Self-pay | Admitting: Family Medicine

## 2024-03-18 DIAGNOSIS — I10 Essential (primary) hypertension: Secondary | ICD-10-CM

## 2024-03-21 ENCOUNTER — Other Ambulatory Visit: Payer: Self-pay | Admitting: Internal Medicine

## 2024-03-21 DIAGNOSIS — I480 Paroxysmal atrial fibrillation: Secondary | ICD-10-CM

## 2024-03-21 NOTE — Telephone Encounter (Signed)
 Prescription refill request for Eliquis  received. Indication:AFIB Last office visit:9/25 Scr: 0.80  1/25 Age:79 Weight:87.1  kg  Prescription refilled

## 2024-04-01 ENCOUNTER — Other Ambulatory Visit: Payer: Self-pay | Admitting: Family Medicine

## 2024-05-02 ENCOUNTER — Ambulatory Visit: Admitting: Internal Medicine

## 2024-05-20 ENCOUNTER — Ambulatory Visit (HOSPITAL_COMMUNITY): Admitting: Internal Medicine

## 2024-10-31 ENCOUNTER — Encounter
